# Patient Record
Sex: Female | Born: 1940
Health system: Southern US, Community
[De-identification: ages and names within clinical notes are randomized; demographics above are authoritative.]

## PROBLEM LIST (undated history)

## (undated) DIAGNOSIS — K219 Gastro-esophageal reflux disease without esophagitis: Secondary | ICD-10-CM

## (undated) DIAGNOSIS — F419 Anxiety disorder, unspecified: Secondary | ICD-10-CM

## (undated) DIAGNOSIS — T8859XA Other complications of anesthesia, initial encounter: Secondary | ICD-10-CM

## (undated) DIAGNOSIS — E042 Nontoxic multinodular goiter: Secondary | ICD-10-CM

## (undated) DIAGNOSIS — G43909 Migraine, unspecified, not intractable, without status migrainosus: Secondary | ICD-10-CM

## (undated) DIAGNOSIS — I10 Essential (primary) hypertension: Secondary | ICD-10-CM

## (undated) DIAGNOSIS — K8020 Calculus of gallbladder without cholecystitis without obstruction: Secondary | ICD-10-CM

## (undated) DIAGNOSIS — J45909 Unspecified asthma, uncomplicated: Secondary | ICD-10-CM

## (undated) DIAGNOSIS — T4145XA Adverse effect of unspecified anesthetic, initial encounter: Secondary | ICD-10-CM

## (undated) DIAGNOSIS — M1990 Unspecified osteoarthritis, unspecified site: Secondary | ICD-10-CM

## (undated) DIAGNOSIS — K589 Irritable bowel syndrome without diarrhea: Secondary | ICD-10-CM

## (undated) HISTORY — PX: KNEE ARTHROPLASTY: SHX992

## (undated) HISTORY — DX: Migraine, unspecified, not intractable, without status migrainosus: G43.909

## (undated) HISTORY — PX: HIP RESECTION ARTHROPLASTY: SHX1759

---
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
## 2008-07-07 ENCOUNTER — Emergency Department (HOSPITAL_COMMUNITY): Admission: EM | Admit: 2008-07-07 | Discharge: 2008-07-07 | Payer: Self-pay | Admitting: Family Medicine

## 2008-12-16 ENCOUNTER — Emergency Department (HOSPITAL_COMMUNITY): Admission: EM | Admit: 2008-12-16 | Discharge: 2008-12-16 | Payer: Self-pay | Admitting: Family Medicine

## 2009-01-13 ENCOUNTER — Ambulatory Visit (HOSPITAL_COMMUNITY): Admission: RE | Admit: 2009-01-13 | Discharge: 2009-01-13 | Payer: Self-pay | Admitting: Obstetrics & Gynecology

## 2009-05-08 ENCOUNTER — Inpatient Hospital Stay (HOSPITAL_COMMUNITY): Admission: RE | Admit: 2009-05-08 | Discharge: 2009-05-11 | Payer: Self-pay | Admitting: Orthopedic Surgery

## 2010-01-15 ENCOUNTER — Encounter: Admission: RE | Admit: 2010-01-15 | Discharge: 2010-01-15 | Payer: Self-pay | Admitting: Internal Medicine

## 2010-02-16 ENCOUNTER — Ambulatory Visit (HOSPITAL_COMMUNITY): Admission: RE | Admit: 2010-02-16 | Discharge: 2010-02-16 | Payer: Self-pay | Admitting: Orthopedic Surgery

## 2010-05-02 ENCOUNTER — Inpatient Hospital Stay (HOSPITAL_COMMUNITY): Admission: RE | Admit: 2010-05-02 | Discharge: 2010-05-05 | Payer: Self-pay | Admitting: Orthopedic Surgery

## 2010-08-26 ENCOUNTER — Encounter: Payer: Self-pay | Admitting: Internal Medicine

## 2010-10-18 LAB — CBC
HCT: 31.5 % — ABNORMAL LOW (ref 36.0–46.0)
Hemoglobin: 11.2 g/dL — ABNORMAL LOW (ref 12.0–15.0)
Hemoglobin: 11.4 g/dL — ABNORMAL LOW (ref 12.0–15.0)
MCHC: 34.6 g/dL (ref 30.0–36.0)
MCHC: 34 g/dL (ref 30.0–36.0)
MCHC: 34.3 g/dL (ref 30.0–36.0)
MCV: 92.7 fL (ref 78.0–100.0)
MCV: 92.7 fL (ref 78.0–100.0)
MCV: 92.9 fL (ref 78.0–100.0)
Platelets: 249 10*3/uL (ref 150–400)
Platelets: 242 10*3/uL (ref 150–400)
Platelets: 249 10*3/uL (ref 150–400)
RBC: 3.49 MIL/uL — ABNORMAL LOW (ref 3.87–5.11)
RDW: 12.9 % (ref 11.5–15.5)
RDW: 13.4 % (ref 11.5–15.5)

## 2010-10-18 LAB — SURGICAL PCR SCREEN
MRSA, PCR: NEGATIVE
Staphylococcus aureus: NEGATIVE

## 2010-10-18 LAB — PROTIME-INR
INR: 1 (ref 0.00–1.49)
INR: 1.46 (ref 0.00–1.49)
Prothrombin Time: 13.4 seconds (ref 11.6–15.2)
Prothrombin Time: 17.9 seconds — ABNORMAL HIGH (ref 11.6–15.2)

## 2010-10-18 LAB — TYPE AND SCREEN
ABO/RH(D): A POS
Antibody Screen: NEGATIVE

## 2010-10-18 LAB — APTT: aPTT: 32 seconds (ref 24–37)

## 2010-10-18 LAB — BASIC METABOLIC PANEL
BUN: 10 mg/dL (ref 6–23)
BUN: 6 mg/dL (ref 6–23)
CO2: 26 mEq/L (ref 19–32)
Calcium: 8.6 mg/dL (ref 8.4–10.5)
Chloride: 104 mEq/L (ref 96–112)
Creatinine, Ser: 0.9 mg/dL (ref 0.4–1.2)
GFR calc Af Amer: >60 mL/min (ref >60)
GFR calc non Af Amer: >60 mL/min (ref >60)
GFR calc non Af Amer: >60 mL/min (ref >60)
Potassium: 3.7 mEq/L (ref 3.5–5.1)
Sodium: 136 mEq/L (ref 135–145)

## 2010-10-18 LAB — COMPREHENSIVE METABOLIC PANEL
AST: 37 U/L (ref 0–37)
Alkaline Phosphatase: 60 U/L (ref 39–117)
Chloride: 108 mEq/L (ref 96–112)
GFR calc Af Amer: 57 mL/min — ABNORMAL LOW (ref >60)
GFR calc non Af Amer: 47 mL/min — ABNORMAL LOW (ref >60)
Sodium: 142 mEq/L (ref 135–145)

## 2010-10-18 LAB — URINE MICROSCOPIC-ADD ON

## 2010-10-18 LAB — URINALYSIS, ROUTINE W REFLEX MICROSCOPIC
Bilirubin Urine: NEGATIVE
Hgb urine dipstick: NEGATIVE
Protein, ur: NEGATIVE mg/dL
Urobilinogen, UA: 1 mg/dL (ref 0.0–1.0)
pH: 5.5 (ref 5.0–8.0)

## 2010-10-20 LAB — COMPREHENSIVE METABOLIC PANEL
Alkaline Phosphatase: 72 U/L (ref 39–117)
BUN: 17 mg/dL (ref 6–23)
Chloride: 104 mEq/L (ref 96–112)
Creatinine, Ser: 1.19 mg/dL (ref 0.4–1.2)
GFR calc non Af Amer: 45 mL/min — ABNORMAL LOW (ref >60)
Glucose, Bld: 124 mg/dL — ABNORMAL HIGH (ref 70–99)
Potassium: 4.1 mEq/L (ref 3.5–5.1)
Total Bilirubin: 0.5 mg/dL (ref 0.3–1.2)

## 2010-10-20 LAB — URINALYSIS, ROUTINE W REFLEX MICROSCOPIC
Bilirubin Urine: NEGATIVE
Protein, ur: NEGATIVE mg/dL
Specific Gravity, Urine: 1.026 (ref 1.005–1.030)

## 2010-10-20 LAB — CBC
HCT: 40.4 % (ref 36.0–46.0)
MCH: 31.9 pg (ref 26.0–34.0)
MCV: 91.8 fL (ref 78.0–100.0)
Platelets: 303 10*3/uL (ref 150–400)
RBC: 4.4 MIL/uL (ref 3.87–5.11)

## 2010-10-20 LAB — SURGICAL PCR SCREEN
MRSA, PCR: NEGATIVE
Staphylococcus aureus: NEGATIVE

## 2010-10-20 LAB — PROTIME-INR
INR: 1.03 (ref 0.00–1.49)
Prothrombin Time: 13.4 seconds (ref 11.6–15.2)

## 2010-11-08 LAB — BASIC METABOLIC PANEL
CO2: 27 mEq/L (ref 19–32)
CO2: 33 mEq/L — ABNORMAL HIGH (ref 19–32)
Chloride: 101 mEq/L (ref 96–112)
Chloride: 104 mEq/L (ref 96–112)
Creatinine, Ser: 0.87 mg/dL (ref 0.4–1.2)
GFR calc Af Amer: >60 mL/min (ref >60)
GFR calc non Af Amer: >60 mL/min (ref >60)
Glucose, Bld: 172 mg/dL — ABNORMAL HIGH (ref 70–99)
Potassium: 4.2 mEq/L (ref 3.5–5.1)
Sodium: 137 mEq/L (ref 135–145)

## 2010-11-08 LAB — CBC
HCT: 31.8 % — ABNORMAL LOW (ref 36.0–46.0)
HCT: 33.2 % — ABNORMAL LOW (ref 36.0–46.0)
Hemoglobin: 10.7 g/dL — ABNORMAL LOW (ref 12.0–15.0)
Hemoglobin: 11.4 g/dL — ABNORMAL LOW (ref 12.0–15.0)
MCHC: 33.6 g/dL (ref 30.0–36.0)
MCHC: 34 g/dL (ref 30.0–36.0)
MCHC: 34.2 g/dL (ref 30.0–36.0)
MCV: 93.3 fL (ref 78.0–100.0)
MCV: 94.3 fL (ref 78.0–100.0)
RBC: 3.18 MIL/uL — ABNORMAL LOW (ref 3.87–5.11)
RDW: 13.1 % (ref 11.5–15.5)
RDW: 13.2 % (ref 11.5–15.5)

## 2010-11-08 LAB — URINALYSIS, ROUTINE W REFLEX MICROSCOPIC
Nitrite: NEGATIVE
Protein, ur: NEGATIVE mg/dL
Urobilinogen, UA: 0.2 mg/dL (ref 0.0–1.0)

## 2010-11-08 LAB — URINE CULTURE
Colony Count: NO GROWTH
Culture: NO GROWTH

## 2010-11-08 LAB — URINE MICROSCOPIC-ADD ON

## 2010-11-08 LAB — PROTIME-INR: Prothrombin Time: 13.9 seconds (ref 11.6–15.2)

## 2010-11-09 LAB — APTT: aPTT: 25 seconds (ref 24–37)

## 2010-11-09 LAB — CBC
HCT: 39.8 % (ref 36.0–46.0)
MCV: 92.9 fL (ref 78.0–100.0)
Platelets: 280 10*3/uL (ref 150–400)
RDW: 13.1 % (ref 11.5–15.5)

## 2010-11-09 LAB — COMPREHENSIVE METABOLIC PANEL
Albumin: 4 g/dL (ref 3.5–5.2)
BUN: 19 mg/dL (ref 6–23)
Chloride: 103 mEq/L (ref 96–112)
Creatinine, Ser: 1.05 mg/dL (ref 0.4–1.2)
Glucose, Bld: 86 mg/dL (ref 70–99)
Total Bilirubin: 0.8 mg/dL (ref 0.3–1.2)
Total Protein: 7.2 g/dL (ref 6.0–8.3)

## 2010-11-09 LAB — PROTIME-INR
INR: 0.9 (ref 0.00–1.49)
Prothrombin Time: 12.3 seconds (ref 11.6–15.2)

## 2010-11-09 LAB — URINALYSIS, ROUTINE W REFLEX MICROSCOPIC
Bilirubin Urine: NEGATIVE
Nitrite: NEGATIVE
Protein, ur: NEGATIVE mg/dL
Specific Gravity, Urine: 1.019 (ref 1.005–1.030)
Urobilinogen, UA: 0.2 mg/dL (ref 0.0–1.0)

## 2011-05-21 ENCOUNTER — Ambulatory Visit (INDEPENDENT_AMBULATORY_CARE_PROVIDER_SITE_OTHER): Payer: Self-pay | Admitting: Family Medicine

## 2011-05-21 DIAGNOSIS — E669 Obesity, unspecified: Secondary | ICD-10-CM

## 2011-08-19 DIAGNOSIS — J209 Acute bronchitis, unspecified: Secondary | ICD-10-CM | POA: Diagnosis not present

## 2011-08-19 DIAGNOSIS — R05 Cough: Secondary | ICD-10-CM | POA: Diagnosis not present

## 2011-10-11 DIAGNOSIS — H43399 Other vitreous opacities, unspecified eye: Secondary | ICD-10-CM | POA: Diagnosis not present

## 2011-10-11 DIAGNOSIS — H40019 Open angle with borderline findings, low risk, unspecified eye: Secondary | ICD-10-CM | POA: Diagnosis not present

## 2011-10-11 DIAGNOSIS — H538 Other visual disturbances: Secondary | ICD-10-CM | POA: Diagnosis not present

## 2011-10-11 DIAGNOSIS — H2510 Age-related nuclear cataract, unspecified eye: Secondary | ICD-10-CM | POA: Diagnosis not present

## 2011-10-11 DIAGNOSIS — H43819 Vitreous degeneration, unspecified eye: Secondary | ICD-10-CM | POA: Diagnosis not present

## 2011-10-28 DIAGNOSIS — M79609 Pain in unspecified limb: Secondary | ICD-10-CM | POA: Diagnosis not present

## 2011-11-01 DIAGNOSIS — H40019 Open angle with borderline findings, low risk, unspecified eye: Secondary | ICD-10-CM | POA: Diagnosis not present

## 2011-11-01 DIAGNOSIS — H2510 Age-related nuclear cataract, unspecified eye: Secondary | ICD-10-CM | POA: Diagnosis not present

## 2011-11-01 DIAGNOSIS — H31019 Macula scars of posterior pole (postinflammatory) (post-traumatic), unspecified eye: Secondary | ICD-10-CM | POA: Diagnosis not present

## 2011-11-01 DIAGNOSIS — H25019 Cortical age-related cataract, unspecified eye: Secondary | ICD-10-CM | POA: Diagnosis not present

## 2011-11-01 DIAGNOSIS — H04129 Dry eye syndrome of unspecified lacrimal gland: Secondary | ICD-10-CM | POA: Diagnosis not present

## 2011-11-08 DIAGNOSIS — J449 Chronic obstructive pulmonary disease, unspecified: Secondary | ICD-10-CM | POA: Diagnosis not present

## 2011-11-08 DIAGNOSIS — I119 Hypertensive heart disease without heart failure: Secondary | ICD-10-CM | POA: Diagnosis not present

## 2011-11-08 DIAGNOSIS — I129 Hypertensive chronic kidney disease with stage 1 through stage 4 chronic kidney disease, or unspecified chronic kidney disease: Secondary | ICD-10-CM | POA: Diagnosis not present

## 2011-11-08 DIAGNOSIS — J45909 Unspecified asthma, uncomplicated: Secondary | ICD-10-CM | POA: Diagnosis not present

## 2011-11-12 DIAGNOSIS — N170 Acute kidney failure with tubular necrosis: Secondary | ICD-10-CM | POA: Diagnosis not present

## 2011-11-12 DIAGNOSIS — I129 Hypertensive chronic kidney disease with stage 1 through stage 4 chronic kidney disease, or unspecified chronic kidney disease: Secondary | ICD-10-CM | POA: Diagnosis not present

## 2011-11-12 DIAGNOSIS — I10 Essential (primary) hypertension: Secondary | ICD-10-CM | POA: Diagnosis not present

## 2011-11-12 DIAGNOSIS — Z79899 Other long term (current) drug therapy: Secondary | ICD-10-CM | POA: Diagnosis not present

## 2011-11-14 ENCOUNTER — Other Ambulatory Visit: Payer: Self-pay | Admitting: Family Medicine

## 2011-11-14 ENCOUNTER — Ambulatory Visit
Admission: RE | Admit: 2011-11-14 | Discharge: 2011-11-14 | Disposition: A | Discharge status: Home / Self Care | Payer: Medicare Other | Source: Ambulatory Visit | Attending: Family Medicine | Admitting: Family Medicine

## 2011-11-14 DIAGNOSIS — J449 Chronic obstructive pulmonary disease, unspecified: Secondary | ICD-10-CM

## 2011-11-14 DIAGNOSIS — Z01811 Encounter for preprocedural respiratory examination: Secondary | ICD-10-CM | POA: Diagnosis not present

## 2011-11-20 DIAGNOSIS — J309 Allergic rhinitis, unspecified: Secondary | ICD-10-CM | POA: Diagnosis not present

## 2011-11-21 DIAGNOSIS — M6240 Contracture of muscle, unspecified site: Secondary | ICD-10-CM | POA: Diagnosis not present

## 2011-11-21 DIAGNOSIS — M7750 Other enthesopathy of unspecified foot: Secondary | ICD-10-CM | POA: Diagnosis not present

## 2011-11-21 DIAGNOSIS — M2020 Hallux rigidus, unspecified foot: Secondary | ICD-10-CM | POA: Diagnosis not present

## 2011-11-21 DIAGNOSIS — M2040 Other hammer toe(s) (acquired), unspecified foot: Secondary | ICD-10-CM | POA: Diagnosis not present

## 2011-12-12 DIAGNOSIS — IMO0002 Reserved for concepts with insufficient information to code with codable children: Secondary | ICD-10-CM | POA: Diagnosis not present

## 2011-12-18 DIAGNOSIS — IMO0002 Reserved for concepts with insufficient information to code with codable children: Secondary | ICD-10-CM | POA: Diagnosis not present

## 2011-12-24 DIAGNOSIS — IMO0002 Reserved for concepts with insufficient information to code with codable children: Secondary | ICD-10-CM | POA: Diagnosis not present

## 2011-12-26 DIAGNOSIS — IMO0002 Reserved for concepts with insufficient information to code with codable children: Secondary | ICD-10-CM | POA: Diagnosis not present

## 2011-12-31 DIAGNOSIS — J309 Allergic rhinitis, unspecified: Secondary | ICD-10-CM | POA: Diagnosis not present

## 2012-01-01 DIAGNOSIS — IMO0002 Reserved for concepts with insufficient information to code with codable children: Secondary | ICD-10-CM | POA: Diagnosis not present

## 2012-01-02 ENCOUNTER — Ambulatory Visit (HOSPITAL_BASED_OUTPATIENT_CLINIC_OR_DEPARTMENT_OTHER): Admission: RE | Admit: 2012-01-02 | Payer: Medicare Other | Source: Ambulatory Visit | Admitting: Orthopedic Surgery

## 2012-01-02 ENCOUNTER — Encounter (HOSPITAL_BASED_OUTPATIENT_CLINIC_OR_DEPARTMENT_OTHER): Admission: RE | Payer: Self-pay | Source: Ambulatory Visit

## 2012-01-02 SURGERY — ARTHRODESIS FOOT WITH WEIL OSTEOTOMY
Anesthesia: General | Site: Foot | Laterality: Left

## 2012-01-03 DIAGNOSIS — IMO0002 Reserved for concepts with insufficient information to code with codable children: Secondary | ICD-10-CM | POA: Diagnosis not present

## 2012-01-07 DIAGNOSIS — IMO0002 Reserved for concepts with insufficient information to code with codable children: Secondary | ICD-10-CM | POA: Diagnosis not present

## 2012-01-10 DIAGNOSIS — IMO0002 Reserved for concepts with insufficient information to code with codable children: Secondary | ICD-10-CM | POA: Diagnosis not present

## 2012-01-15 DIAGNOSIS — IMO0002 Reserved for concepts with insufficient information to code with codable children: Secondary | ICD-10-CM | POA: Diagnosis not present

## 2012-01-21 DIAGNOSIS — IMO0002 Reserved for concepts with insufficient information to code with codable children: Secondary | ICD-10-CM | POA: Diagnosis not present

## 2012-02-11 DIAGNOSIS — IMO0002 Reserved for concepts with insufficient information to code with codable children: Secondary | ICD-10-CM | POA: Diagnosis not present

## 2012-04-16 DIAGNOSIS — H2510 Age-related nuclear cataract, unspecified eye: Secondary | ICD-10-CM | POA: Diagnosis not present

## 2012-04-16 DIAGNOSIS — H25019 Cortical age-related cataract, unspecified eye: Secondary | ICD-10-CM | POA: Diagnosis not present

## 2012-04-16 DIAGNOSIS — H40019 Open angle with borderline findings, low risk, unspecified eye: Secondary | ICD-10-CM | POA: Diagnosis not present

## 2012-04-16 DIAGNOSIS — H43399 Other vitreous opacities, unspecified eye: Secondary | ICD-10-CM | POA: Diagnosis not present

## 2012-04-16 DIAGNOSIS — H35379 Puckering of macula, unspecified eye: Secondary | ICD-10-CM | POA: Diagnosis not present

## 2012-05-05 DIAGNOSIS — Z23 Encounter for immunization: Secondary | ICD-10-CM | POA: Diagnosis not present

## 2012-06-15 DIAGNOSIS — J3089 Other allergic rhinitis: Secondary | ICD-10-CM | POA: Diagnosis not present

## 2012-06-15 DIAGNOSIS — L659 Nonscarring hair loss, unspecified: Secondary | ICD-10-CM | POA: Diagnosis not present

## 2012-06-15 DIAGNOSIS — E559 Vitamin D deficiency, unspecified: Secondary | ICD-10-CM | POA: Diagnosis not present

## 2012-06-15 DIAGNOSIS — Z0000 Encounter for general adult medical examination without abnormal findings: Secondary | ICD-10-CM | POA: Diagnosis not present

## 2012-06-15 DIAGNOSIS — M25519 Pain in unspecified shoulder: Secondary | ICD-10-CM | POA: Diagnosis not present

## 2012-06-15 DIAGNOSIS — M7530 Calcific tendinitis of unspecified shoulder: Secondary | ICD-10-CM | POA: Diagnosis not present

## 2012-06-15 DIAGNOSIS — I119 Hypertensive heart disease without heart failure: Secondary | ICD-10-CM | POA: Diagnosis not present

## 2012-10-02 DIAGNOSIS — J069 Acute upper respiratory infection, unspecified: Secondary | ICD-10-CM | POA: Diagnosis not present

## 2012-10-02 DIAGNOSIS — I10 Essential (primary) hypertension: Secondary | ICD-10-CM | POA: Diagnosis not present

## 2012-11-23 DIAGNOSIS — H43399 Other vitreous opacities, unspecified eye: Secondary | ICD-10-CM | POA: Diagnosis not present

## 2012-11-23 DIAGNOSIS — H04129 Dry eye syndrome of unspecified lacrimal gland: Secondary | ICD-10-CM | POA: Diagnosis not present

## 2012-11-23 DIAGNOSIS — H43819 Vitreous degeneration, unspecified eye: Secondary | ICD-10-CM | POA: Diagnosis not present

## 2012-11-23 DIAGNOSIS — H40019 Open angle with borderline findings, low risk, unspecified eye: Secondary | ICD-10-CM | POA: Diagnosis not present

## 2013-01-08 DIAGNOSIS — E559 Vitamin D deficiency, unspecified: Secondary | ICD-10-CM | POA: Diagnosis not present

## 2013-01-08 DIAGNOSIS — J449 Chronic obstructive pulmonary disease, unspecified: Secondary | ICD-10-CM | POA: Diagnosis not present

## 2013-01-08 DIAGNOSIS — I129 Hypertensive chronic kidney disease with stage 1 through stage 4 chronic kidney disease, or unspecified chronic kidney disease: Secondary | ICD-10-CM | POA: Diagnosis not present

## 2013-02-08 DIAGNOSIS — Z79899 Other long term (current) drug therapy: Secondary | ICD-10-CM | POA: Diagnosis not present

## 2013-02-08 DIAGNOSIS — I10 Essential (primary) hypertension: Secondary | ICD-10-CM | POA: Diagnosis not present

## 2013-02-08 DIAGNOSIS — R209 Unspecified disturbances of skin sensation: Secondary | ICD-10-CM | POA: Diagnosis not present

## 2013-02-08 DIAGNOSIS — M159 Polyosteoarthritis, unspecified: Secondary | ICD-10-CM | POA: Diagnosis not present

## 2013-02-16 DIAGNOSIS — M169 Osteoarthritis of hip, unspecified: Secondary | ICD-10-CM | POA: Diagnosis not present

## 2013-02-16 DIAGNOSIS — Z96659 Presence of unspecified artificial knee joint: Secondary | ICD-10-CM | POA: Diagnosis not present

## 2013-02-16 DIAGNOSIS — Z471 Aftercare following joint replacement surgery: Secondary | ICD-10-CM | POA: Diagnosis not present

## 2013-02-16 DIAGNOSIS — M545 Low back pain: Secondary | ICD-10-CM | POA: Diagnosis not present

## 2013-02-16 DIAGNOSIS — Z96649 Presence of unspecified artificial hip joint: Secondary | ICD-10-CM | POA: Diagnosis not present

## 2013-02-16 DIAGNOSIS — M1710 Unilateral primary osteoarthritis, unspecified knee: Secondary | ICD-10-CM | POA: Diagnosis not present

## 2013-03-09 DIAGNOSIS — Z87891 Personal history of nicotine dependence: Secondary | ICD-10-CM | POA: Diagnosis not present

## 2013-03-09 DIAGNOSIS — J309 Allergic rhinitis, unspecified: Secondary | ICD-10-CM | POA: Diagnosis not present

## 2013-03-09 DIAGNOSIS — J449 Chronic obstructive pulmonary disease, unspecified: Secondary | ICD-10-CM | POA: Diagnosis not present

## 2013-03-10 DIAGNOSIS — M2010 Hallux valgus (acquired), unspecified foot: Secondary | ICD-10-CM | POA: Diagnosis not present

## 2013-03-10 DIAGNOSIS — G5760 Lesion of plantar nerve, unspecified lower limb: Secondary | ICD-10-CM | POA: Diagnosis not present

## 2013-03-10 DIAGNOSIS — M2040 Other hammer toe(s) (acquired), unspecified foot: Secondary | ICD-10-CM | POA: Diagnosis not present

## 2013-03-22 DIAGNOSIS — M2020 Hallux rigidus, unspecified foot: Secondary | ICD-10-CM | POA: Diagnosis not present

## 2013-03-22 DIAGNOSIS — M2040 Other hammer toe(s) (acquired), unspecified foot: Secondary | ICD-10-CM | POA: Diagnosis not present

## 2013-03-22 DIAGNOSIS — M79609 Pain in unspecified limb: Secondary | ICD-10-CM | POA: Diagnosis not present

## 2013-04-29 DIAGNOSIS — H04129 Dry eye syndrome of unspecified lacrimal gland: Secondary | ICD-10-CM | POA: Diagnosis not present

## 2013-04-29 DIAGNOSIS — H43399 Other vitreous opacities, unspecified eye: Secondary | ICD-10-CM | POA: Diagnosis not present

## 2013-04-29 DIAGNOSIS — H538 Other visual disturbances: Secondary | ICD-10-CM | POA: Diagnosis not present

## 2013-04-29 DIAGNOSIS — H2510 Age-related nuclear cataract, unspecified eye: Secondary | ICD-10-CM | POA: Diagnosis not present

## 2013-04-29 DIAGNOSIS — H35379 Puckering of macula, unspecified eye: Secondary | ICD-10-CM | POA: Diagnosis not present

## 2013-04-29 DIAGNOSIS — H31019 Macula scars of posterior pole (postinflammatory) (post-traumatic), unspecified eye: Secondary | ICD-10-CM | POA: Diagnosis not present

## 2013-04-29 DIAGNOSIS — H40019 Open angle with borderline findings, low risk, unspecified eye: Secondary | ICD-10-CM | POA: Diagnosis not present

## 2013-05-06 DIAGNOSIS — Z23 Encounter for immunization: Secondary | ICD-10-CM | POA: Diagnosis not present

## 2013-06-23 DIAGNOSIS — E785 Hyperlipidemia, unspecified: Secondary | ICD-10-CM | POA: Diagnosis not present

## 2013-06-23 DIAGNOSIS — I10 Essential (primary) hypertension: Secondary | ICD-10-CM | POA: Diagnosis not present

## 2013-06-23 DIAGNOSIS — Z79899 Other long term (current) drug therapy: Secondary | ICD-10-CM | POA: Diagnosis not present

## 2013-06-23 DIAGNOSIS — Z0000 Encounter for general adult medical examination without abnormal findings: Secondary | ICD-10-CM | POA: Diagnosis not present

## 2013-06-23 DIAGNOSIS — E559 Vitamin D deficiency, unspecified: Secondary | ICD-10-CM | POA: Diagnosis not present

## 2013-06-29 DIAGNOSIS — I129 Hypertensive chronic kidney disease with stage 1 through stage 4 chronic kidney disease, or unspecified chronic kidney disease: Secondary | ICD-10-CM | POA: Diagnosis not present

## 2013-06-29 DIAGNOSIS — E785 Hyperlipidemia, unspecified: Secondary | ICD-10-CM | POA: Diagnosis not present

## 2013-06-29 DIAGNOSIS — J449 Chronic obstructive pulmonary disease, unspecified: Secondary | ICD-10-CM | POA: Diagnosis not present

## 2013-06-29 DIAGNOSIS — M1710 Unilateral primary osteoarthritis, unspecified knee: Secondary | ICD-10-CM | POA: Diagnosis not present

## 2013-06-29 DIAGNOSIS — Z0000 Encounter for general adult medical examination without abnormal findings: Secondary | ICD-10-CM | POA: Diagnosis not present

## 2013-07-19 ENCOUNTER — Other Ambulatory Visit: Payer: Self-pay | Admitting: Internal Medicine

## 2013-07-19 DIAGNOSIS — Z1231 Encounter for screening mammogram for malignant neoplasm of breast: Secondary | ICD-10-CM

## 2013-10-12 DIAGNOSIS — H40019 Open angle with borderline findings, low risk, unspecified eye: Secondary | ICD-10-CM | POA: Diagnosis not present

## 2013-10-12 DIAGNOSIS — H04129 Dry eye syndrome of unspecified lacrimal gland: Secondary | ICD-10-CM | POA: Diagnosis not present

## 2013-12-28 DIAGNOSIS — I129 Hypertensive chronic kidney disease with stage 1 through stage 4 chronic kidney disease, or unspecified chronic kidney disease: Secondary | ICD-10-CM | POA: Diagnosis not present

## 2013-12-28 DIAGNOSIS — J309 Allergic rhinitis, unspecified: Secondary | ICD-10-CM | POA: Diagnosis not present

## 2013-12-28 DIAGNOSIS — N183 Chronic kidney disease, stage 3 unspecified: Secondary | ICD-10-CM | POA: Diagnosis not present

## 2013-12-28 DIAGNOSIS — M159 Polyosteoarthritis, unspecified: Secondary | ICD-10-CM | POA: Diagnosis not present

## 2014-05-02 DIAGNOSIS — H2510 Age-related nuclear cataract, unspecified eye: Secondary | ICD-10-CM | POA: Diagnosis not present

## 2014-05-02 DIAGNOSIS — H25019 Cortical age-related cataract, unspecified eye: Secondary | ICD-10-CM | POA: Diagnosis not present

## 2014-05-02 DIAGNOSIS — H35379 Puckering of macula, unspecified eye: Secondary | ICD-10-CM | POA: Diagnosis not present

## 2014-05-02 DIAGNOSIS — H5210 Myopia, unspecified eye: Secondary | ICD-10-CM | POA: Diagnosis not present

## 2014-05-02 DIAGNOSIS — H40019 Open angle with borderline findings, low risk, unspecified eye: Secondary | ICD-10-CM | POA: Diagnosis not present

## 2014-05-13 DIAGNOSIS — Z23 Encounter for immunization: Secondary | ICD-10-CM | POA: Diagnosis not present

## 2014-05-23 DIAGNOSIS — J45909 Unspecified asthma, uncomplicated: Secondary | ICD-10-CM | POA: Diagnosis not present

## 2014-05-23 DIAGNOSIS — I10 Essential (primary) hypertension: Secondary | ICD-10-CM | POA: Diagnosis not present

## 2014-05-23 DIAGNOSIS — J309 Allergic rhinitis, unspecified: Secondary | ICD-10-CM | POA: Diagnosis not present

## 2014-05-23 DIAGNOSIS — M1990 Unspecified osteoarthritis, unspecified site: Secondary | ICD-10-CM | POA: Diagnosis not present

## 2014-06-28 DIAGNOSIS — S46111A Strain of muscle, fascia and tendon of long head of biceps, right arm, initial encounter: Secondary | ICD-10-CM | POA: Diagnosis not present

## 2014-06-28 DIAGNOSIS — W109XXA Fall (on) (from) unspecified stairs and steps, initial encounter: Secondary | ICD-10-CM | POA: Diagnosis not present

## 2014-07-15 DIAGNOSIS — S46111D Strain of muscle, fascia and tendon of long head of biceps, right arm, subsequent encounter: Secondary | ICD-10-CM | POA: Diagnosis not present

## 2014-07-19 DIAGNOSIS — S46111D Strain of muscle, fascia and tendon of long head of biceps, right arm, subsequent encounter: Secondary | ICD-10-CM | POA: Diagnosis not present

## 2014-07-25 DIAGNOSIS — I10 Essential (primary) hypertension: Secondary | ICD-10-CM | POA: Diagnosis not present

## 2014-07-25 DIAGNOSIS — E782 Mixed hyperlipidemia: Secondary | ICD-10-CM | POA: Diagnosis not present

## 2014-07-25 DIAGNOSIS — R7309 Other abnormal glucose: Secondary | ICD-10-CM | POA: Diagnosis not present

## 2014-07-25 DIAGNOSIS — Z0000 Encounter for general adult medical examination without abnormal findings: Secondary | ICD-10-CM | POA: Diagnosis not present

## 2014-07-26 DIAGNOSIS — S46111D Strain of muscle, fascia and tendon of long head of biceps, right arm, subsequent encounter: Secondary | ICD-10-CM | POA: Diagnosis not present

## 2014-08-02 DIAGNOSIS — S46111D Strain of muscle, fascia and tendon of long head of biceps, right arm, subsequent encounter: Secondary | ICD-10-CM | POA: Diagnosis not present

## 2014-08-03 DIAGNOSIS — I10 Essential (primary) hypertension: Secondary | ICD-10-CM | POA: Diagnosis not present

## 2014-08-03 DIAGNOSIS — Z0000 Encounter for general adult medical examination without abnormal findings: Secondary | ICD-10-CM | POA: Diagnosis not present

## 2014-08-10 ENCOUNTER — Other Ambulatory Visit: Payer: Self-pay

## 2014-08-10 DIAGNOSIS — Z1231 Encounter for screening mammogram for malignant neoplasm of breast: Secondary | ICD-10-CM

## 2014-08-30 ENCOUNTER — Ambulatory Visit: Payer: Medicare Other

## 2014-09-07 DIAGNOSIS — M25511 Pain in right shoulder: Secondary | ICD-10-CM | POA: Diagnosis not present

## 2014-09-07 DIAGNOSIS — M542 Cervicalgia: Secondary | ICD-10-CM | POA: Diagnosis not present

## 2014-09-13 ENCOUNTER — Ambulatory Visit: Payer: Medicare Other

## 2014-09-15 DIAGNOSIS — M19011 Primary osteoarthritis, right shoulder: Secondary | ICD-10-CM | POA: Diagnosis not present

## 2014-09-20 DIAGNOSIS — M19011 Primary osteoarthritis, right shoulder: Secondary | ICD-10-CM | POA: Diagnosis not present

## 2014-10-04 DIAGNOSIS — M7551 Bursitis of right shoulder: Secondary | ICD-10-CM | POA: Diagnosis not present

## 2014-10-04 DIAGNOSIS — M545 Low back pain: Secondary | ICD-10-CM | POA: Diagnosis not present

## 2014-10-06 DIAGNOSIS — H40013 Open angle with borderline findings, low risk, bilateral: Secondary | ICD-10-CM | POA: Diagnosis not present

## 2014-10-10 DIAGNOSIS — M25511 Pain in right shoulder: Secondary | ICD-10-CM | POA: Diagnosis not present

## 2014-10-10 DIAGNOSIS — M6281 Muscle weakness (generalized): Secondary | ICD-10-CM | POA: Diagnosis not present

## 2014-10-10 DIAGNOSIS — M7541 Impingement syndrome of right shoulder: Secondary | ICD-10-CM | POA: Diagnosis not present

## 2014-11-03 DIAGNOSIS — J069 Acute upper respiratory infection, unspecified: Secondary | ICD-10-CM | POA: Diagnosis not present

## 2014-11-03 DIAGNOSIS — M549 Dorsalgia, unspecified: Secondary | ICD-10-CM | POA: Diagnosis not present

## 2014-11-03 DIAGNOSIS — R05 Cough: Secondary | ICD-10-CM | POA: Diagnosis not present

## 2014-11-03 DIAGNOSIS — I10 Essential (primary) hypertension: Secondary | ICD-10-CM | POA: Diagnosis not present

## 2014-11-08 DIAGNOSIS — M25511 Pain in right shoulder: Secondary | ICD-10-CM | POA: Diagnosis not present

## 2014-11-14 DIAGNOSIS — M6281 Muscle weakness (generalized): Secondary | ICD-10-CM | POA: Diagnosis not present

## 2014-11-14 DIAGNOSIS — M25659 Stiffness of unspecified hip, not elsewhere classified: Secondary | ICD-10-CM | POA: Diagnosis not present

## 2014-11-14 DIAGNOSIS — M545 Low back pain: Secondary | ICD-10-CM | POA: Diagnosis not present

## 2014-11-17 DIAGNOSIS — M25659 Stiffness of unspecified hip, not elsewhere classified: Secondary | ICD-10-CM | POA: Diagnosis not present

## 2014-11-17 DIAGNOSIS — M545 Low back pain: Secondary | ICD-10-CM | POA: Diagnosis not present

## 2014-11-17 DIAGNOSIS — M6281 Muscle weakness (generalized): Secondary | ICD-10-CM | POA: Diagnosis not present

## 2014-11-21 DIAGNOSIS — M6281 Muscle weakness (generalized): Secondary | ICD-10-CM | POA: Diagnosis not present

## 2014-11-21 DIAGNOSIS — M25659 Stiffness of unspecified hip, not elsewhere classified: Secondary | ICD-10-CM | POA: Diagnosis not present

## 2014-11-21 DIAGNOSIS — M545 Low back pain: Secondary | ICD-10-CM | POA: Diagnosis not present

## 2014-11-24 DIAGNOSIS — M25659 Stiffness of unspecified hip, not elsewhere classified: Secondary | ICD-10-CM | POA: Diagnosis not present

## 2014-11-24 DIAGNOSIS — M6281 Muscle weakness (generalized): Secondary | ICD-10-CM | POA: Diagnosis not present

## 2014-11-24 DIAGNOSIS — M545 Low back pain: Secondary | ICD-10-CM | POA: Diagnosis not present

## 2014-12-01 DIAGNOSIS — M25659 Stiffness of unspecified hip, not elsewhere classified: Secondary | ICD-10-CM | POA: Diagnosis not present

## 2014-12-01 DIAGNOSIS — M6281 Muscle weakness (generalized): Secondary | ICD-10-CM | POA: Diagnosis not present

## 2014-12-01 DIAGNOSIS — M545 Low back pain: Secondary | ICD-10-CM | POA: Diagnosis not present

## 2014-12-05 DIAGNOSIS — M545 Low back pain: Secondary | ICD-10-CM | POA: Diagnosis not present

## 2014-12-05 DIAGNOSIS — M25659 Stiffness of unspecified hip, not elsewhere classified: Secondary | ICD-10-CM | POA: Diagnosis not present

## 2014-12-05 DIAGNOSIS — M6281 Muscle weakness (generalized): Secondary | ICD-10-CM | POA: Diagnosis not present

## 2014-12-08 DIAGNOSIS — M545 Low back pain: Secondary | ICD-10-CM | POA: Diagnosis not present

## 2014-12-08 DIAGNOSIS — M25659 Stiffness of unspecified hip, not elsewhere classified: Secondary | ICD-10-CM | POA: Diagnosis not present

## 2014-12-08 DIAGNOSIS — M6281 Muscle weakness (generalized): Secondary | ICD-10-CM | POA: Diagnosis not present

## 2014-12-12 DIAGNOSIS — M545 Low back pain: Secondary | ICD-10-CM | POA: Diagnosis not present

## 2014-12-12 DIAGNOSIS — M25659 Stiffness of unspecified hip, not elsewhere classified: Secondary | ICD-10-CM | POA: Diagnosis not present

## 2014-12-12 DIAGNOSIS — M6281 Muscle weakness (generalized): Secondary | ICD-10-CM | POA: Diagnosis not present

## 2014-12-15 DIAGNOSIS — M25511 Pain in right shoulder: Secondary | ICD-10-CM | POA: Diagnosis not present

## 2014-12-15 DIAGNOSIS — M5136 Other intervertebral disc degeneration, lumbar region: Secondary | ICD-10-CM | POA: Diagnosis not present

## 2014-12-29 DIAGNOSIS — M25511 Pain in right shoulder: Secondary | ICD-10-CM | POA: Diagnosis not present

## 2015-02-24 DIAGNOSIS — J449 Chronic obstructive pulmonary disease, unspecified: Secondary | ICD-10-CM | POA: Diagnosis not present

## 2015-02-24 DIAGNOSIS — I10 Essential (primary) hypertension: Secondary | ICD-10-CM | POA: Diagnosis not present

## 2015-02-24 DIAGNOSIS — J45901 Unspecified asthma with (acute) exacerbation: Secondary | ICD-10-CM | POA: Diagnosis not present

## 2015-02-24 DIAGNOSIS — Z7982 Long term (current) use of aspirin: Secondary | ICD-10-CM | POA: Diagnosis not present

## 2015-03-01 DIAGNOSIS — Z79899 Other long term (current) drug therapy: Secondary | ICD-10-CM | POA: Diagnosis not present

## 2015-03-01 DIAGNOSIS — Z87891 Personal history of nicotine dependence: Secondary | ICD-10-CM | POA: Diagnosis not present

## 2015-03-01 DIAGNOSIS — Z7982 Long term (current) use of aspirin: Secondary | ICD-10-CM | POA: Diagnosis not present

## 2015-03-01 DIAGNOSIS — J45909 Unspecified asthma, uncomplicated: Secondary | ICD-10-CM | POA: Diagnosis not present

## 2015-04-18 DIAGNOSIS — R05 Cough: Secondary | ICD-10-CM | POA: Diagnosis not present

## 2015-04-18 DIAGNOSIS — J45909 Unspecified asthma, uncomplicated: Secondary | ICD-10-CM | POA: Diagnosis not present

## 2015-04-18 DIAGNOSIS — J309 Allergic rhinitis, unspecified: Secondary | ICD-10-CM | POA: Diagnosis not present

## 2015-04-18 DIAGNOSIS — R51 Headache: Secondary | ICD-10-CM | POA: Diagnosis not present

## 2015-07-10 DIAGNOSIS — Z23 Encounter for immunization: Secondary | ICD-10-CM | POA: Diagnosis not present

## 2015-08-18 DIAGNOSIS — J309 Allergic rhinitis, unspecified: Secondary | ICD-10-CM | POA: Diagnosis not present

## 2015-08-18 DIAGNOSIS — M75111 Incomplete rotator cuff tear or rupture of right shoulder, not specified as traumatic: Secondary | ICD-10-CM | POA: Diagnosis not present

## 2015-08-18 DIAGNOSIS — J301 Allergic rhinitis due to pollen: Secondary | ICD-10-CM | POA: Insufficient documentation

## 2015-08-18 DIAGNOSIS — J410 Simple chronic bronchitis: Secondary | ICD-10-CM | POA: Diagnosis not present

## 2015-08-18 DIAGNOSIS — K219 Gastro-esophageal reflux disease without esophagitis: Secondary | ICD-10-CM | POA: Diagnosis not present

## 2015-08-18 DIAGNOSIS — K589 Irritable bowel syndrome without diarrhea: Secondary | ICD-10-CM | POA: Diagnosis not present

## 2015-08-18 DIAGNOSIS — I10 Essential (primary) hypertension: Secondary | ICD-10-CM | POA: Diagnosis not present

## 2015-08-18 DIAGNOSIS — M150 Primary generalized (osteo)arthritis: Secondary | ICD-10-CM | POA: Diagnosis not present

## 2015-11-15 DIAGNOSIS — H40013 Open angle with borderline findings, low risk, bilateral: Secondary | ICD-10-CM | POA: Diagnosis not present

## 2015-11-15 DIAGNOSIS — H35031 Hypertensive retinopathy, right eye: Secondary | ICD-10-CM | POA: Diagnosis not present

## 2015-11-15 DIAGNOSIS — H35032 Hypertensive retinopathy, left eye: Secondary | ICD-10-CM | POA: Diagnosis not present

## 2015-11-15 DIAGNOSIS — H35371 Puckering of macula, right eye: Secondary | ICD-10-CM | POA: Diagnosis not present

## 2015-11-16 DIAGNOSIS — M150 Primary generalized (osteo)arthritis: Secondary | ICD-10-CM | POA: Diagnosis not present

## 2015-11-16 DIAGNOSIS — I10 Essential (primary) hypertension: Secondary | ICD-10-CM | POA: Diagnosis not present

## 2015-11-16 DIAGNOSIS — K219 Gastro-esophageal reflux disease without esophagitis: Secondary | ICD-10-CM | POA: Diagnosis not present

## 2015-11-16 DIAGNOSIS — R0609 Other forms of dyspnea: Secondary | ICD-10-CM | POA: Diagnosis not present

## 2015-11-16 DIAGNOSIS — J301 Allergic rhinitis due to pollen: Secondary | ICD-10-CM | POA: Diagnosis not present

## 2015-11-16 DIAGNOSIS — J410 Simple chronic bronchitis: Secondary | ICD-10-CM | POA: Diagnosis not present

## 2015-11-16 DIAGNOSIS — E785 Hyperlipidemia, unspecified: Secondary | ICD-10-CM | POA: Diagnosis not present

## 2015-11-20 DIAGNOSIS — Z79899 Other long term (current) drug therapy: Secondary | ICD-10-CM | POA: Insufficient documentation

## 2015-11-24 DIAGNOSIS — Z1231 Encounter for screening mammogram for malignant neoplasm of breast: Secondary | ICD-10-CM | POA: Diagnosis not present

## 2015-11-29 DIAGNOSIS — Z780 Asymptomatic menopausal state: Secondary | ICD-10-CM | POA: Diagnosis not present

## 2016-02-16 DIAGNOSIS — I10 Essential (primary) hypertension: Secondary | ICD-10-CM | POA: Diagnosis not present

## 2016-02-16 DIAGNOSIS — R0600 Dyspnea, unspecified: Secondary | ICD-10-CM | POA: Diagnosis not present

## 2016-02-16 DIAGNOSIS — J301 Allergic rhinitis due to pollen: Secondary | ICD-10-CM | POA: Diagnosis not present

## 2016-02-16 DIAGNOSIS — D229 Melanocytic nevi, unspecified: Secondary | ICD-10-CM | POA: Diagnosis not present

## 2016-03-13 DIAGNOSIS — D239 Other benign neoplasm of skin, unspecified: Secondary | ICD-10-CM | POA: Diagnosis not present

## 2016-03-13 DIAGNOSIS — L821 Other seborrheic keratosis: Secondary | ICD-10-CM | POA: Diagnosis not present

## 2016-03-13 DIAGNOSIS — L570 Actinic keratosis: Secondary | ICD-10-CM | POA: Diagnosis not present

## 2016-05-07 DIAGNOSIS — J301 Allergic rhinitis due to pollen: Secondary | ICD-10-CM | POA: Diagnosis not present

## 2016-05-07 DIAGNOSIS — R05 Cough: Secondary | ICD-10-CM | POA: Diagnosis not present

## 2016-05-07 DIAGNOSIS — R0602 Shortness of breath: Secondary | ICD-10-CM | POA: Diagnosis not present

## 2016-05-07 DIAGNOSIS — I10 Essential (primary) hypertension: Secondary | ICD-10-CM | POA: Diagnosis not present

## 2016-05-16 DIAGNOSIS — M25511 Pain in right shoulder: Secondary | ICD-10-CM | POA: Diagnosis not present

## 2016-05-22 DIAGNOSIS — H40013 Open angle with borderline findings, low risk, bilateral: Secondary | ICD-10-CM | POA: Diagnosis not present

## 2016-05-22 DIAGNOSIS — H04123 Dry eye syndrome of bilateral lacrimal glands: Secondary | ICD-10-CM | POA: Diagnosis not present

## 2016-05-28 DIAGNOSIS — Z0000 Encounter for general adult medical examination without abnormal findings: Secondary | ICD-10-CM | POA: Diagnosis not present

## 2016-06-20 DIAGNOSIS — R0609 Other forms of dyspnea: Secondary | ICD-10-CM | POA: Diagnosis not present

## 2016-06-20 DIAGNOSIS — J301 Allergic rhinitis due to pollen: Secondary | ICD-10-CM | POA: Diagnosis not present

## 2016-06-20 DIAGNOSIS — J45909 Unspecified asthma, uncomplicated: Secondary | ICD-10-CM | POA: Diagnosis not present

## 2016-06-20 DIAGNOSIS — I10 Essential (primary) hypertension: Secondary | ICD-10-CM | POA: Diagnosis not present

## 2016-07-04 DIAGNOSIS — Z79899 Other long term (current) drug therapy: Secondary | ICD-10-CM | POA: Diagnosis not present

## 2016-07-04 DIAGNOSIS — I10 Essential (primary) hypertension: Secondary | ICD-10-CM | POA: Diagnosis not present

## 2016-08-20 DIAGNOSIS — R0602 Shortness of breath: Secondary | ICD-10-CM | POA: Diagnosis not present

## 2016-08-20 DIAGNOSIS — I10 Essential (primary) hypertension: Secondary | ICD-10-CM | POA: Diagnosis not present

## 2016-08-30 DIAGNOSIS — I10 Essential (primary) hypertension: Secondary | ICD-10-CM | POA: Diagnosis not present

## 2016-08-30 DIAGNOSIS — Z789 Other specified health status: Secondary | ICD-10-CM | POA: Insufficient documentation

## 2016-09-14 ENCOUNTER — Inpatient Hospital Stay (HOSPITAL_COMMUNITY)
Admission: EM | Admit: 2016-09-14 | Discharge: 2016-09-20 | DRG: 339 | Disposition: A | Discharge status: Home / Self Care | Payer: Medicare Other | Attending: Surgery | Admitting: Surgery

## 2016-09-14 ENCOUNTER — Encounter (HOSPITAL_COMMUNITY): Admission: EM | Disposition: A | Discharge status: Home / Self Care | Payer: Self-pay | Source: Home / Self Care

## 2016-09-14 ENCOUNTER — Emergency Department (HOSPITAL_COMMUNITY): Payer: Medicare Other | Admitting: Anesthesiology

## 2016-09-14 ENCOUNTER — Emergency Department (HOSPITAL_COMMUNITY): Payer: Medicare Other

## 2016-09-14 ENCOUNTER — Encounter (HOSPITAL_COMMUNITY): Payer: Self-pay | Admitting: Emergency Medicine

## 2016-09-14 DIAGNOSIS — I10 Essential (primary) hypertension: Secondary | ICD-10-CM | POA: Diagnosis not present

## 2016-09-14 DIAGNOSIS — K760 Fatty (change of) liver, not elsewhere classified: Secondary | ICD-10-CM | POA: Diagnosis present

## 2016-09-14 DIAGNOSIS — Z7982 Long term (current) use of aspirin: Secondary | ICD-10-CM

## 2016-09-14 DIAGNOSIS — K3580 Unspecified acute appendicitis: Secondary | ICD-10-CM

## 2016-09-14 DIAGNOSIS — I700 Atherosclerosis of aorta: Secondary | ICD-10-CM | POA: Diagnosis present

## 2016-09-14 DIAGNOSIS — Z96641 Presence of right artificial hip joint: Secondary | ICD-10-CM | POA: Diagnosis present

## 2016-09-14 DIAGNOSIS — Z87891 Personal history of nicotine dependence: Secondary | ICD-10-CM | POA: Diagnosis not present

## 2016-09-14 DIAGNOSIS — R1084 Generalized abdominal pain: Secondary | ICD-10-CM | POA: Diagnosis not present

## 2016-09-14 DIAGNOSIS — K3532 Acute appendicitis with perforation and localized peritonitis, without abscess: Secondary | ICD-10-CM | POA: Diagnosis present

## 2016-09-14 DIAGNOSIS — M1990 Unspecified osteoarthritis, unspecified site: Secondary | ICD-10-CM | POA: Diagnosis present

## 2016-09-14 DIAGNOSIS — K567 Ileus, unspecified: Secondary | ICD-10-CM | POA: Diagnosis not present

## 2016-09-14 DIAGNOSIS — E440 Moderate protein-calorie malnutrition: Secondary | ICD-10-CM | POA: Insufficient documentation

## 2016-09-14 DIAGNOSIS — K352 Acute appendicitis with generalized peritonitis: Principal | ICD-10-CM | POA: Diagnosis present

## 2016-09-14 DIAGNOSIS — Z79899 Other long term (current) drug therapy: Secondary | ICD-10-CM

## 2016-09-14 HISTORY — PX: LAPAROSCOPIC APPENDECTOMY: SHX408

## 2016-09-14 HISTORY — DX: Irritable bowel syndrome, unspecified: K58.9

## 2016-09-14 HISTORY — DX: Essential (primary) hypertension: I10

## 2016-09-14 LAB — COMPREHENSIVE METABOLIC PANEL
ALBUMIN: 3.9 g/dL (ref 3.5–5.0)
ALT: 15 U/L (ref 14–54)
ANION GAP: 10 (ref 5–15)
AST: 27 U/L (ref 15–41)
Alkaline Phosphatase: 69 U/L (ref 38–126)
BILIRUBIN TOTAL: 1.3 mg/dL — AB (ref 0.3–1.2)
BUN: 22 mg/dL — ABNORMAL HIGH (ref 6–20)
CHLORIDE: 98 mmol/L — AB (ref 101–111)
CO2: 27 mmol/L (ref 22–32)
Calcium: 9.7 mg/dL (ref 8.9–10.3)
Creatinine, Ser: 1.18 mg/dL — ABNORMAL HIGH (ref 0.44–1.00)
GFR calc Af Amer: 51 mL/min — ABNORMAL LOW (ref >60)
GFR, EST NON AFRICAN AMERICAN: 44 mL/min — AB (ref >60)
GLUCOSE: 108 mg/dL — AB (ref 65–99)
POTASSIUM: 4.2 mmol/L (ref 3.5–5.1)
Sodium: 135 mmol/L (ref 135–145)
TOTAL PROTEIN: 7.4 g/dL (ref 6.5–8.1)

## 2016-09-14 LAB — URINALYSIS, ROUTINE W REFLEX MICROSCOPIC
BACTERIA UA: NONE SEEN
BILIRUBIN URINE: NEGATIVE
GLUCOSE, UA: NEGATIVE mg/dL
KETONES UR: 5 mg/dL — AB
LEUKOCYTES UA: NEGATIVE
NITRITE: NEGATIVE
PROTEIN: 100 mg/dL — AB
Specific Gravity, Urine: 1.026 (ref 1.005–1.030)
pH: 5 (ref 5.0–8.0)

## 2016-09-14 LAB — CBC
HEMATOCRIT: 41.2 % (ref 36.0–46.0)
HEMOGLOBIN: 14.2 g/dL (ref 12.0–15.0)
MCH: 31 pg (ref 26.0–34.0)
MCHC: 34.5 g/dL (ref 30.0–36.0)
MCV: 90 fL (ref 78.0–100.0)
Platelets: 201 10*3/uL (ref 150–400)
RBC: 4.58 MIL/uL (ref 3.87–5.11)
RDW: 13 % (ref 11.5–15.5)
WBC: 13.3 10*3/uL — ABNORMAL HIGH (ref 4.0–10.5)

## 2016-09-14 LAB — LIPASE, BLOOD: LIPASE: 16 U/L (ref 11–51)

## 2016-09-14 SURGERY — APPENDECTOMY, LAPAROSCOPIC
Anesthesia: General | Site: Abdomen

## 2016-09-14 MED ORDER — SODIUM CHLORIDE 0.9 % IV BOLUS (SEPSIS)
500.0000 mL | Freq: Once | INTRAVENOUS | Status: AC
  Administered 2016-09-14: 500 mL via INTRAVENOUS

## 2016-09-14 MED ORDER — PIPERACILLIN-TAZOBACTAM 3.375 G IVPB 30 MIN
3.3750 g | Freq: Once | INTRAVENOUS | Status: AC
  Administered 2016-09-14: 3.375 g via INTRAVENOUS
  Filled 2016-09-14: qty 50

## 2016-09-14 MED ORDER — IOPAMIDOL (ISOVUE-300) INJECTION 61%
100.0000 mL | Freq: Once | INTRAVENOUS | Status: AC | PRN
  Administered 2016-09-14: 80 mL via INTRAVENOUS

## 2016-09-14 MED ORDER — HYDROMORPHONE HCL 1 MG/ML IJ SOLN
0.5000 mg | INTRAMUSCULAR | Status: DC | PRN
  Administered 2016-09-14: 1 mg via INTRAVENOUS
  Filled 2016-09-14: qty 1

## 2016-09-14 MED ORDER — SUCCINYLCHOLINE CHLORIDE 200 MG/10ML IV SOSY
PREFILLED_SYRINGE | INTRAVENOUS | Status: DC | PRN
  Administered 2016-09-14: 100 mg via INTRAVENOUS

## 2016-09-14 MED ORDER — KCL IN DEXTROSE-NACL 20-5-0.45 MEQ/L-%-% IV SOLN
INTRAVENOUS | Status: DC
  Administered 2016-09-14 – 2016-09-16 (×3): via INTRAVENOUS
  Administered 2016-09-16: 1000 mL via INTRAVENOUS
  Administered 2016-09-17 – 2016-09-19 (×4): via INTRAVENOUS
  Filled 2016-09-14 (×11): qty 1000

## 2016-09-14 MED ORDER — ONDANSETRON HCL 4 MG/2ML IJ SOLN
INTRAMUSCULAR | Status: DC | PRN
  Administered 2016-09-14: 4 mg via INTRAVENOUS

## 2016-09-14 MED ORDER — PROPOFOL 10 MG/ML IV BOLUS
INTRAVENOUS | Status: AC
  Filled 2016-09-14: qty 20

## 2016-09-14 MED ORDER — PROPOFOL 10 MG/ML IV BOLUS
INTRAVENOUS | Status: DC | PRN
  Administered 2016-09-14: 100 mg via INTRAVENOUS

## 2016-09-14 MED ORDER — FENTANYL CITRATE (PF) 250 MCG/5ML IJ SOLN
INTRAMUSCULAR | Status: AC
Start: 2016-09-14 — End: 2016-09-14
  Filled 2016-09-14: qty 5

## 2016-09-14 MED ORDER — SUCCINYLCHOLINE CHLORIDE 200 MG/10ML IV SOSY
PREFILLED_SYRINGE | INTRAVENOUS | Status: AC
  Filled 2016-09-14: qty 10

## 2016-09-14 MED ORDER — BUPIVACAINE HCL (PF) 0.25 % IJ SOLN
INTRAMUSCULAR | Status: AC
  Filled 2016-09-14: qty 30

## 2016-09-14 MED ORDER — IOPAMIDOL (ISOVUE-300) INJECTION 61%
INTRAVENOUS | Status: AC
  Filled 2016-09-14: qty 100

## 2016-09-14 MED ORDER — ONDANSETRON 4 MG PO TBDP: 4.0000 mg | ORAL_TABLET | Freq: Four times a day (QID) | ORAL | Status: DC | PRN

## 2016-09-14 MED ORDER — HYDROMORPHONE HCL 2 MG/ML IJ SOLN: 0.5000 mg | INTRAMUSCULAR | Status: DC | PRN

## 2016-09-14 MED ORDER — DEXAMETHASONE SODIUM PHOSPHATE 10 MG/ML IJ SOLN
INTRAMUSCULAR | Status: AC
  Filled 2016-09-14: qty 1

## 2016-09-14 MED ORDER — ACETAMINOPHEN 650 MG RE SUPP: 650.0000 mg | Freq: Four times a day (QID) | RECTAL | Status: DC | PRN

## 2016-09-14 MED ORDER — SUGAMMADEX SODIUM 200 MG/2ML IV SOLN
INTRAVENOUS | Status: DC | PRN
  Administered 2016-09-14: 200 mg via INTRAVENOUS

## 2016-09-14 MED ORDER — LACTATED RINGERS IR SOLN
Status: DC | PRN
  Administered 2016-09-14: 3000 mL

## 2016-09-14 MED ORDER — ACETAMINOPHEN 325 MG PO TABS
650.0000 mg | ORAL_TABLET | Freq: Four times a day (QID) | ORAL | Status: DC | PRN
  Administered 2016-09-15 – 2016-09-20 (×4): 650 mg via ORAL
  Filled 2016-09-14 (×4): qty 2

## 2016-09-14 MED ORDER — BOOST / RESOURCE BREEZE PO LIQD
1.0000 | Freq: Three times a day (TID) | ORAL | Status: DC
  Administered 2016-09-14 – 2016-09-15 (×2): 1 via ORAL

## 2016-09-14 MED ORDER — PIPERACILLIN-TAZOBACTAM 3.375 G IVPB
3.3750 g | Freq: Three times a day (TID) | INTRAVENOUS | Status: DC
  Administered 2016-09-14 – 2016-09-19 (×15): 3.375 g via INTRAVENOUS
  Filled 2016-09-14 (×15): qty 50

## 2016-09-14 MED ORDER — FENTANYL CITRATE (PF) 100 MCG/2ML IJ SOLN
INTRAMUSCULAR | Status: DC | PRN
  Administered 2016-09-14: 50 ug via INTRAVENOUS
  Administered 2016-09-14: 100 ug via INTRAVENOUS
  Administered 2016-09-14 (×2): 50 ug via INTRAVENOUS

## 2016-09-14 MED ORDER — PROMETHAZINE HCL 25 MG/ML IJ SOLN: 6.2500 mg | INTRAMUSCULAR | Status: DC | PRN

## 2016-09-14 MED ORDER — HYDROMORPHONE HCL 2 MG/ML IJ SOLN: 1.0000 mg | INTRAMUSCULAR | Status: DC | PRN

## 2016-09-14 MED ORDER — ACETAMINOPHEN 10 MG/ML IV SOLN
INTRAVENOUS | Status: DC | PRN
  Administered 2016-09-14: 1000 mg via INTRAVENOUS

## 2016-09-14 MED ORDER — HYDROCODONE-ACETAMINOPHEN 5-325 MG PO TABS
1.0000 | ORAL_TABLET | ORAL | Status: DC | PRN
  Administered 2016-09-17: 1 via ORAL
  Administered 2016-09-17: 2 via ORAL
  Administered 2016-09-17: 1 via ORAL
  Administered 2016-09-18: 2 via ORAL
  Administered 2016-09-18: 1 via ORAL
  Administered 2016-09-19: 2 via ORAL
  Filled 2016-09-14: qty 2
  Filled 2016-09-14: qty 1
  Filled 2016-09-14: qty 2
  Filled 2016-09-14 (×2): qty 1
  Filled 2016-09-14: qty 2

## 2016-09-14 MED ORDER — ONDANSETRON HCL 4 MG/2ML IJ SOLN
INTRAMUSCULAR | Status: AC
  Filled 2016-09-14: qty 2

## 2016-09-14 MED ORDER — LIDOCAINE 2% (20 MG/ML) 5 ML SYRINGE
INTRAMUSCULAR | Status: DC | PRN
  Administered 2016-09-14: 100 mg via INTRAVENOUS

## 2016-09-14 MED ORDER — LACTATED RINGERS IV SOLN
INTRAVENOUS | Status: DC | PRN
  Administered 2016-09-14 (×2): via INTRAVENOUS

## 2016-09-14 MED ORDER — BUPIVACAINE-EPINEPHRINE (PF) 0.25% -1:200000 IJ SOLN
INTRAMUSCULAR | Status: DC | PRN
  Administered 2016-09-14: 30 mL

## 2016-09-14 MED ORDER — FENTANYL CITRATE (PF) 100 MCG/2ML IJ SOLN: 25.0000 ug | INTRAMUSCULAR | Status: DC | PRN

## 2016-09-14 MED ORDER — ACETAMINOPHEN 10 MG/ML IV SOLN
INTRAVENOUS | Status: AC
  Filled 2016-09-14: qty 100

## 2016-09-14 MED ORDER — ROCURONIUM BROMIDE 10 MG/ML (PF) SYRINGE
PREFILLED_SYRINGE | INTRAVENOUS | Status: DC | PRN
  Administered 2016-09-14: 30 mg via INTRAVENOUS

## 2016-09-14 MED ORDER — 0.9 % SODIUM CHLORIDE (POUR BTL) OPTIME
TOPICAL | Status: DC | PRN
  Administered 2016-09-14: 1000 mL

## 2016-09-14 MED ORDER — DEXAMETHASONE SODIUM PHOSPHATE 10 MG/ML IJ SOLN
INTRAMUSCULAR | Status: DC | PRN
  Administered 2016-09-14: 10 mg via INTRAVENOUS

## 2016-09-14 MED ORDER — ROCURONIUM BROMIDE 50 MG/5ML IV SOSY
PREFILLED_SYRINGE | INTRAVENOUS | Status: AC
  Filled 2016-09-14: qty 5

## 2016-09-14 MED ORDER — ONDANSETRON HCL 4 MG/2ML IJ SOLN
4.0000 mg | Freq: Four times a day (QID) | INTRAMUSCULAR | Status: DC | PRN
  Administered 2016-09-15 – 2016-09-19 (×2): 4 mg via INTRAVENOUS
  Filled 2016-09-14 (×2): qty 2

## 2016-09-14 MED ORDER — ONDANSETRON HCL 4 MG/2ML IJ SOLN
4.0000 mg | Freq: Once | INTRAMUSCULAR | Status: AC
  Administered 2016-09-14: 4 mg via INTRAVENOUS
  Filled 2016-09-14: qty 2

## 2016-09-14 MED ORDER — SUGAMMADEX SODIUM 200 MG/2ML IV SOLN
INTRAVENOUS | Status: AC
  Filled 2016-09-14: qty 2

## 2016-09-14 MED ORDER — LIDOCAINE 2% (20 MG/ML) 5 ML SYRINGE
INTRAMUSCULAR | Status: AC
Start: 2016-09-14 — End: 2016-09-14
  Filled 2016-09-14: qty 5

## 2016-09-14 MED ORDER — FENTANYL CITRATE (PF) 100 MCG/2ML IJ SOLN
50.0000 ug | Freq: Once | INTRAMUSCULAR | Status: AC
  Administered 2016-09-14: 50 ug via INTRAVENOUS
  Filled 2016-09-14: qty 2

## 2016-09-14 SURGICAL SUPPLY — 35 items
APPLIER CLIP ROT 10 11.4 M/L (STAPLE)
BENZOIN TINCTURE PRP APPL 2/3 (GAUZE/BANDAGES/DRESSINGS) ×3 IMPLANT
CHLORAPREP W/TINT 26ML (MISCELLANEOUS) ×3 IMPLANT
CLIP APPLIE ROT 10 11.4 M/L (STAPLE) IMPLANT
CLOSURE STERI-STRIP 1/4X4 (GAUZE/BANDAGES/DRESSINGS) ×3 IMPLANT
CLOSURE WOUND 1/2 X4 (GAUZE/BANDAGES/DRESSINGS) ×1
COVER SURGICAL LIGHT HANDLE (MISCELLANEOUS) ×3 IMPLANT
CUTTER FLEX LINEAR 45M (STAPLE) ×3 IMPLANT
DECANTER SPIKE VIAL GLASS SM (MISCELLANEOUS) ×3 IMPLANT
DRAPE LAPAROSCOPIC ABDOMINAL (DRAPES) ×3 IMPLANT
ELECT REM PT RETURN 9FT ADLT (ELECTROSURGICAL) ×3
ELECTRODE REM PT RTRN 9FT ADLT (ELECTROSURGICAL) ×1 IMPLANT
ENDOLOOP SUT PDS II  0 18 (SUTURE)
ENDOLOOP SUT PDS II 0 18 (SUTURE) IMPLANT
GAUZE SPONGE 2X2 8PLY STRL LF (GAUZE/BANDAGES/DRESSINGS) ×1 IMPLANT
GLOVE SURG ORTHO 8.0 STRL STRW (GLOVE) ×3 IMPLANT
GOWN STRL REUS W/TWL XL LVL3 (GOWN DISPOSABLE) ×6 IMPLANT
IRRIG SUCT STRYKERFLOW 2 WTIP (MISCELLANEOUS) ×3
IRRIGATION SUCT STRKRFLW 2 WTP (MISCELLANEOUS) ×1 IMPLANT
KIT BASIN OR (CUSTOM PROCEDURE TRAY) ×3 IMPLANT
POUCH SPECIMEN RETRIEVAL 10MM (ENDOMECHANICALS) ×3 IMPLANT
RELOAD 45 VASCULAR/THIN (ENDOMECHANICALS) IMPLANT
RELOAD STAPLE TA45 3.5 REG BLU (ENDOMECHANICALS) ×3 IMPLANT
SHEARS HARMONIC ACE PLUS 36CM (ENDOMECHANICALS) ×3 IMPLANT
SPONGE GAUZE 2X2 STER 10/PKG (GAUZE/BANDAGES/DRESSINGS) ×2
STRIP CLOSURE SKIN 1/2X4 (GAUZE/BANDAGES/DRESSINGS) ×2 IMPLANT
SUT MNCRL AB 4-0 PS2 18 (SUTURE) ×3 IMPLANT
TAPE CLOTH SURG 4X10 WHT LF (GAUZE/BANDAGES/DRESSINGS) ×3 IMPLANT
TOWEL OR 17X26 10 PK STRL BLUE (TOWEL DISPOSABLE) ×3 IMPLANT
TOWEL OR NON WOVEN STRL DISP B (DISPOSABLE) ×3 IMPLANT
TRAY FOLEY W/METER SILVER 14FR (SET/KITS/TRAYS/PACK) IMPLANT
TRAY FOLEY W/METER SILVER 16FR (SET/KITS/TRAYS/PACK) ×3 IMPLANT
TRAY LAPAROSCOPIC (CUSTOM PROCEDURE TRAY) ×3 IMPLANT
TROCAR XCEL BLUNT TIP 100MML (ENDOMECHANICALS) ×3 IMPLANT
TROCAR XCEL NON-BLD 11X100MML (ENDOMECHANICALS) ×3 IMPLANT

## 2016-09-14 NOTE — Anesthesia Procedure Notes (Signed)
Procedure Name: Intubation Date/Time: 09/14/2016 3:23 PM Performed by: Danley Danker L Patient Re-evaluated:Patient Re-evaluated prior to inductionOxygen Delivery Method: Circle system utilized Preoxygenation: Pre-oxygenation with 100% oxygen Intubation Type: IV induction and Rapid sequence Ventilation: Mask ventilation without difficulty Laryngoscope Size: Miller and 2 Grade View: Grade I Tube type: Oral Tube size: 7.5 mm Number of attempts: 1 Airway Equipment and Method: Stylet Placement Confirmation: ETT inserted through vocal cords under direct vision,  positive ETCO2 and breath sounds checked- equal and bilateral Secured at: 21 cm Tube secured with: Tape Dental Injury: Teeth and Oropharynx as per pre-operative assessment

## 2016-09-14 NOTE — ED Notes (Signed)
Pt O2 sat dropped to 87%, pt placed on 2L

## 2016-09-14 NOTE — ED Triage Notes (Addendum)
Pt reports generalized abd pain for the past few days that localized to RLQ last night. No n/v/d. Had a fever a few days ago. Pt feels better today.

## 2016-09-14 NOTE — H&P (Signed)
Jessica Beck is an 76 y.o. female.    General Surgery [facility]  Chief Complaint: abdominal pain, acute appendicitis  HPI: patient is a 76 yo WF with 3 day hx of abdominal pain localizing to the RLQ.  Patient has had nausea but no emesis.  Low grade fever.  Mild diarrheal stools.  No prior abdominal surgery.  Presents to ER.  WBC elevated at 13K.  CTA positive for acute appendicitis.  Possible SBO distal jejunum (?).  General surgery called for evaluation and management.  Past Medical History:  Diagnosis Date  . Hypertension   . IBS (irritable bowel syndrome)     History reviewed. No pertinent surgical history.  History reviewed. No pertinent family history. Social History:  reports that she has quit smoking. She has never used smokeless tobacco. She reports that she drinks alcohol. Her drug history is not on file.  Allergies:  Allergies  Allergen Reactions  . Flexeril [Cyclobenzaprine] Other (See Comments)    Not reported reaction  Rash, anxiety  . Latex Other (See Comments)    Not reported reaction   . Oxycodone Rash    hives  . Sulfa Antibiotics Other (See Comments)    Not reported reaction  rash     (Not in a hospital admission)  Results for orders placed or performed during the hospital encounter of 09/14/16 (from the past 48 hour(s))  Urinalysis, Routine w reflex microscopic     Status: Abnormal   Collection Time: 09/14/16  9:24 AM  Result Value Ref Range   Color, Urine AMBER (A) YELLOW    Comment: BIOCHEMICALS MAY BE AFFECTED BY COLOR   APPearance CLEAR CLEAR   Specific Gravity, Urine 1.026 1.005 - 1.030   pH 5.0 5.0 - 8.0   Glucose, UA NEGATIVE NEGATIVE mg/dL   Hgb urine dipstick MODERATE (A) NEGATIVE   Bilirubin Urine NEGATIVE NEGATIVE   Ketones, ur 5 (A) NEGATIVE mg/dL   Protein, ur 100 (A) NEGATIVE mg/dL   Nitrite NEGATIVE NEGATIVE   Leukocytes, UA NEGATIVE NEGATIVE   RBC / HPF 0-5 0 - 5 RBC/hpf   WBC, UA 0-5 0 - 5 WBC/hpf   Bacteria, UA NONE SEEN NONE SEEN   Squamous Epithelial / LPF 0-5 (A) NONE SEEN   Mucous PRESENT    Hyaline Casts, UA PRESENT   Lipase, blood     Status: None   Collection Time: 09/14/16  9:56 AM  Result Value Ref Range   Lipase 16 11 - 51 U/L  Comprehensive metabolic panel     Status: Abnormal   Collection Time: 09/14/16  9:56 AM  Result Value Ref Range   Sodium 135 135 - 145 mmol/L   Potassium 4.2 3.5 - 5.1 mmol/L   Chloride 98 (L) 101 - 111 mmol/L   CO2 27 22 - 32 mmol/L   Glucose, Bld 108 (H) 65 - 99 mg/dL   BUN 22 (H) 6 - 20 mg/dL   Creatinine, Ser 1.18 (H) 0.44 - 1.00 mg/dL   Calcium 9.7 8.9 - 10.3 mg/dL   Total Protein 7.4 6.5 - 8.1 g/dL   Albumin 3.9 3.5 - 5.0 g/dL   AST 27 15 - 41 U/L   ALT 15 14 - 54 U/L   Alkaline Phosphatase 69 38 - 126 U/L   Total Bilirubin 1.3 (H) 0.3 - 1.2 mg/dL   GFR calc non Af Amer 44 (L) >60 mL/min   GFR calc Af Amer 51 (L) >60 mL/min  Comment: (NOTE) The eGFR has been calculated using the CKD EPI equation. This calculation has not been validated in all clinical situations. eGFR's persistently <60 mL/min signify possible Chronic Kidney Disease.    Anion gap 10 5 - 15  CBC     Status: Abnormal   Collection Time: 09/14/16  9:56 AM  Result Value Ref Range   WBC 13.3 (H) 4.0 - 10.5 K/uL   RBC 4.58 3.87 - 5.11 MIL/uL   Hemoglobin 14.2 12.0 - 15.0 g/dL   HCT 41.2 36.0 - 46.0 %   MCV 90.0 78.0 - 100.0 fL   MCH 31.0 26.0 - 34.0 pg   MCHC 34.5 30.0 - 36.0 g/dL   RDW 13.0 11.5 - 15.5 %   Platelets 201 150 - 400 K/uL   Ct Abdomen Pelvis W Contrast  Result Date: 09/14/2016 CLINICAL DATA:  Generalized abdominal pain for a few days, localizing in the right lower quadrant. EXAM: CT ABDOMEN AND PELVIS WITH CONTRAST TECHNIQUE: Multidetector CT imaging of the abdomen and pelvis was performed using the standard protocol following bolus administration of intravenous contrast. CONTRAST:  27m ISOVUE-300 IOPAMIDOL (ISOVUE-300) INJECTION 61% COMPARISON:   None. FINDINGS: Lower chest: Dependent atelectasis bilaterally. No pulmonary nodules, masses, or suspicious infiltrates. The lung bases are otherwise within normal limits. Hepatobiliary: There is a stone dependently in the gallbladder without wall thickening. The portal vein is normal. Hepatic steatosis is identified. No focal liver mass is noted. Pancreas: Unremarkable. No pancreatic ductal dilatation or surrounding inflammatory changes. Spleen: Normal in size without focal abnormality. Adrenals/Urinary Tract: A small low-attenuation lesion in the upper left kidney is too small to characterize but almost certainly a cyst. The kidneys are otherwise normal in appearance with no obstruction or perinephric stranding. No ureterectasis or ureteral stones. The bladder is normal. Stomach/Bowel: The stomach is normal. There is a small bowel obstruction. A transition point is best seen on coronal image 60 in the region of the distal jejunum. The small bowel is decompressed over a significant distance after the transition point. However, a few more distal loops are borderline in caliber. The distal ileum is relatively decompressed as well. The colon is smaller in caliber than the small bowel. There are scattered colonic diverticuli without diverticulitis. There is an appendicolith in the proximal appendix and multiple smaller appendicoliths more distally in the appendix. The appendix is distended distal to the proximal appendicolith with adjacent stranding. The findings are consistent with appendicitis. There is some reactive free fluid in the lower abdomen and pelvis but no focal abscess. No extraluminal gas is seen to suggest perforation. Vascular/Lymphatic: Mild atherosclerosis in the abdominal aorta. No adenopathy. Reproductive: There is a right ovarian cyst measuring 18 mm. The uterus and adnexae are otherwise normal. Other: No free air.  Mild free fluid in the lower abdomen pelvis. Musculoskeletal: Status post right hip  replacement. No other acute bony abnormalities. Degenerative changes in the spine. IMPRESSION: 1. Appendicitis without abscess or perforation. Multiple appendicoliths. 2. Small bowel obstruction. There is a transition point in the distal jejunum. There is a segment of decompressed bowel after this transition point followed by multiple borderline and mildly prominent loops. The more distal mildly prominent loops could be due to ileus or a second transition point. The distal ileum is decompressed. 3. Atherosclerosis. 4. Cholelithiasis. 5. Suggested 18 mm cyst in the right ovary. Recommend ultrasound for further evaluation in this postmenopausal woman. Electronically Signed   By: DDorise BullionIII M.D   On: 09/14/2016 11:28  Review of Systems  Constitutional: Positive for chills and fever. Negative for diaphoresis.  HENT: Negative.   Eyes: Negative.   Respiratory: Positive for shortness of breath.   Cardiovascular: Negative.   Gastrointestinal: Positive for abdominal pain (localized to RLQ), diarrhea and nausea. Negative for vomiting.  Genitourinary: Negative.   Musculoskeletal: Negative.   Skin: Negative.   Neurological: Negative.   Endo/Heme/Allergies: Negative.   Psychiatric/Behavioral: Negative.     Blood pressure 117/78, pulse 85, temperature 98.6 F (37 C), temperature source Oral, resp. rate 16, weight 70.3 kg (155 lb), SpO2 97 %. Physical Exam  Constitutional: She is oriented to person, place, and time. She appears well-developed and well-nourished. No distress.  HENT:  Head: Normocephalic and atraumatic.  Right Ear: External ear normal.  Left Ear: External ear normal.  Eyes: Conjunctivae are normal. Pupils are equal, round, and reactive to light.  Neck: Normal range of motion. Neck supple. No tracheal deviation present. No thyromegaly present.  Cardiovascular: Normal rate, regular rhythm and normal heart sounds.   No murmur heard. Respiratory: Effort normal and breath sounds  normal. No respiratory distress. She has no wheezes.  GI: Soft. She exhibits no distension and no mass. There is tenderness (RLQ). There is guarding. There is no rebound.  Musculoskeletal: Normal range of motion. She exhibits no edema or deformity.  Neurological: She is alert and oriented to person, place, and time.  Skin: Skin is warm and dry. She is not diaphoretic.  Psychiatric: She has a normal mood and affect. Her behavior is normal.     Assessment/Plan Acute appendicitis  Plan admission to general surgery service for appendectomy today  Begin IV abx  The risks and benefits of the procedure have been discussed at length with the patient.  The patient understands the proposed procedure, potential alternative treatments, and the course of recovery to be expected.  All of the patient's questions have been answered at this time.  The patient wishes to proceed with surgery.  Earnstine Regal, MD, [facility] Office: [place], MD 09/14/2016, 12:15 PM

## 2016-09-14 NOTE — Op Note (Signed)
OPERATIVE REPORT - LAPAROSCOPIC APPENDECTOMY  Preop diagnosis: Acute appendicitis  Postop diagnosis: acute appendicitis, gangrenous, with perforation and peritonitis  Procedure: Laparoscopic appendectomy  Surgeon:  Earnstine Regal, MD, FACS  Anesthesia: General endotracheal  Estimated blood loss: Minimal  Preparation: Chlora-prep  Complications: None  Indications:  patient is a 76 yo WF with 3 day hx of abdominal pain localizing to the RLQ.  Patient has had nausea but no emesis.  Low grade fever.  Mild diarrheal stools.  No prior abdominal surgery.  Presents to ER.  WBC elevated at 13K.  CTA positive for acute appendicitis.   Procedure:  Patient is brought to the operating room and placed in a supine position on the operating room table. Following administration of general anesthesia, a time out was held and the patient's name and procedure is confirmed. Patient is then prepped and draped in the usual strict aseptic fashion.  After ascertaining that an adequate level of anesthesia has been achieved, a peri-umbilical incision is made with a #15 blade. Dissection is carried down to the fascia. Fascia is incised in the midline and the peritoneal cavity is entered cautiously. A #0-vicryl pursestring suture is placed in the fascia. An Hassan cannula is introduced under direct vision and secured with the pursestring suture. The abdomen is insufflated with carbon dioxide. The laparoscope is introduced and the abdomen is explored. Operative ports are placed in the right upper quadrant and left lower quadrant. The appendix is identified. The appendix is gangrenous with perforation and surrounding peritonitis.  The mesoappendix is divided with the harmonic scalpel. Dissection is carried down to the base of the appendix. The base of the appendix is dissected out clearing the junction with the cecal wall. Using an Endo-GIA stapler, the base of the appendix is transected at the junction with the cecal wall.  There is good approximation of tissue along the staple line. There is good hemostasis along the staple line. The appendix is placed into an endo-catch bag and withdrawn through the umbilical port. The #0-vicryl pursestring suture is tied securely.  Right lower quadrant and pelvis is irrigated with warm saline and evacuated. Good hemostasis is noted. Ports are removed under direct vision. Good hemostasis is noted at the port sites. Pneumoperitoneum is released.  Skin incisions are anesthetized with local anesthetic. Wounds are closed with interrupted 4-0 Monocryl subcuticular sutures. Wounds are washed and dried and Steri-Strips are applied. Dressings are applied. The patient is awakened from anesthesia and brought to the recovery room. The patient tolerated the procedure well.  Earnstine Regal, MD, [facility] Office: [phone]

## 2016-09-14 NOTE — Anesthesia Preprocedure Evaluation (Addendum)
Anesthesia Evaluation  Patient identified by MRN, date of birth, ID band Patient awake    Reviewed: Allergy & Precautions, NPO status , Patient's Chart, lab work & pertinent test results  Airway Mallampati: II  TM Distance: >3 FB Neck ROM: Full    Dental  (+) Dental Advisory Given   Pulmonary former smoker,    breath sounds clear to auscultation       Cardiovascular Exercise Tolerance: Good hypertension, Pt. on medications (-) angina+ DOE   Rhythm:Regular Rate:Normal + Systolic murmurs (II/VI)    Neuro/Psych negative neurological ROS     GI/Hepatic Neg liver ROS, Acute appendicitis   Endo/Other  negative endocrine ROS  Renal/GU negative Renal ROS     Musculoskeletal   Abdominal   Peds  Hematology negative hematology ROS (+)   Anesthesia Other Findings   Reproductive/Obstetrics                            Lab Results  Component Value Date   WBC 13.3 (H) 09/14/2016   HGB 14.2 09/14/2016   HCT 41.2 09/14/2016   MCV 90.0 09/14/2016   PLT 201 09/14/2016   Lab Results  Component Value Date   CREATININE 1.18 (H) 09/14/2016   BUN 22 (H) 09/14/2016   NA 135 09/14/2016   K 4.2 09/14/2016   CL 98 (L) 09/14/2016   CO2 27 09/14/2016    Anesthesia Physical Anesthesia Plan  ASA: II  Anesthesia Plan: General   Post-op Pain Management:    Induction: Intravenous  Airway Management Planned: Oral ETT  Additional Equipment:   Intra-op Plan:   Post-operative Plan: Extubation in OR  Informed Consent: I have reviewed the patients History and Physical, chart, labs and discussed the procedure including the risks, benefits and alternatives for the proposed anesthesia with the patient or authorized representative who has indicated his/her understanding and acceptance.   Dental advisory given  Plan Discussed with: CRNA  Anesthesia Plan Comments:         Anesthesia Quick  Evaluation

## 2016-09-14 NOTE — ED Provider Notes (Signed)
[facility] DEPT Provider Note   CSN: [id number] Arrival date & time: 09/14/16  [id number]     History   Chief Complaint Chief Complaint  Patient presents with  . Abdominal Pain    HPI Jessica Beck is a 76 y.o. female.  HPI Patient presents with abdominal pain. Began a few days ago. Began somewhat diffusely but then localized the right lower quadrant. His had fevers up to 103. Has had a little bit of diarrhea. Today is less severe pain than it was but still feels bad. States the bumps in the road here were very difficult. She has had a decreased appetite.   Past Medical History:  Diagnosis Date  . Hypertension   . IBS (irritable bowel syndrome)     Patient Active Problem List   Diagnosis Date Noted  . Acute gangrenous appendicitis with perforation and peritonitis 09/14/2016    History reviewed. No pertinent surgical history.  OB History    No data available       Home Medications    Prior to Admission medications   Medication Sig Start Date End Date Taking? Authorizing Provider  aspirin EC 81 MG tablet Take 81 mg by mouth daily.   Yes Historical Provider, MD  cetirizine (ZYRTEC) 10 MG tablet Take 10 mg by mouth daily.   Yes Historical Provider, MD  Cholecalciferol (VITAMIN D3) 2000 units TABS Take 2,000 Units by mouth daily.   Yes Historical Provider, MD  co-enzyme Q-10 30 MG capsule Take 1 capsule by mouth daily.   Yes Historical Provider, MD  magnesium oxide (MAG-OX) 400 MG tablet Take 400 mg by mouth daily.   Yes Historical Provider, MD  meloxicam (MOBIC) 15 MG tablet Take 15 mg by mouth daily. 08/29/16  Yes Historical Provider, MD  Omega 3 1000 MG CAPS Take 1,000 mg by mouth daily.   Yes Historical Provider, MD  omeprazole (PRILOSEC) 20 MG capsule Take 20 mg by mouth daily. 08/29/16  Yes Historical Provider, MD  Polyethyl Glycol-Propyl Glycol (SYSTANE) 0.4-0.3 % SOLN Place 1 drop into both eyes daily.   Yes Historical Provider, MD  PROAIR HFA 108 [phone] Base)  MCG/ACT inhaler Take 1 spray by mouth daily as needed. 06/20/16  Yes Historical Provider, MD  Probiotic Product (ALIGN PO) Take 1 capsule by mouth daily.   Yes Historical Provider, MD  traMADol (ULTRAM) 50 MG tablet Take 50 mg by mouth 3 (three) times daily as needed. 03/25/16  Yes Historical Provider, MD  triamcinolone cream (KENALOG) 0.1 % Apply 1 application topically daily as needed. 11/20/15  Yes Historical Provider, MD  valsartan (DIOVAN) 160 MG tablet Take 160 mg by mouth daily. 07/14/16  Yes Historical Provider, MD  verapamil (CALAN-SR) 120 MG CR tablet Take 120 mg by mouth daily. 08/29/16  Yes Historical Provider, MD    Family History History reviewed. No pertinent family history.  Social History Social History  Substance Use Topics  . Smoking status: Former Research scientist (life sciences)  . Smokeless tobacco: Never Used  . Alcohol use Yes     Allergies   Flexeril [cyclobenzaprine]; Latex; Oxycodone; and Sulfa antibiotics   Review of Systems Review of Systems  Constitutional: Positive for appetite change and fever.  HENT: Negative for congestion.   Respiratory: Negative for shortness of breath.   Cardiovascular: Negative for chest pain.  Gastrointestinal: Positive for abdominal pain, diarrhea and nausea. Negative for blood in stool.  Genitourinary: Negative for dysuria.  Musculoskeletal: Negative for back pain.  Skin: Negative for wound.  Neurological: Negative for  headaches.  Hematological: Negative for adenopathy.  Psychiatric/Behavioral: Negative for confusion.     Physical Exam Updated Vital Signs BP (P) 131/97 (BP Location: Right Arm)   Pulse 83   Temp (P) 98.3 F (36.8 C)   Resp (!) 34   Wt 155 lb (70.3 kg)   SpO2 97%   Physical Exam  Constitutional: She appears well-developed.  HENT:  Head: Atraumatic.  Eyes: EOM are normal.  Neck: Neck supple.  Cardiovascular: Normal rate.   Pulmonary/Chest: Effort normal.  Abdominal: There is tenderness.  Somewhat diffuse tenderness  but localized severe tenderness with some peritonitis and right lower quadrant. Patient is lying stiff in bed.  Musculoskeletal: She exhibits no tenderness.  Neurological: She is alert.  Skin: Skin is warm.  Psychiatric: She has a normal mood and affect.     ED Treatments / Results  Labs (all labs ordered are listed, but only abnormal results are displayed) Labs Reviewed  COMPREHENSIVE METABOLIC PANEL - Abnormal; Notable for the following:       Result Value   Chloride 98 (*)    Glucose, Bld 108 (*)    BUN 22 (*)    Creatinine, Ser 1.18 (*)    Total Bilirubin 1.3 (*)    GFR calc non Af Amer 44 (*)    GFR calc Af Amer 51 (*)    All other components within normal limits  CBC - Abnormal; Notable for the following:    WBC 13.3 (*)    All other components within normal limits  URINALYSIS, ROUTINE W REFLEX MICROSCOPIC - Abnormal; Notable for the following:    Color, Urine AMBER (*)    Hgb urine dipstick MODERATE (*)    Ketones, ur 5 (*)    Protein, ur 100 (*)    Squamous Epithelial / LPF 0-5 (*)    All other components within normal limits  LIPASE, BLOOD  SURGICAL PATHOLOGY    EKG  EKG Interpretation None       Radiology Ct Abdomen Pelvis W Contrast  Result Date: 09/14/2016 CLINICAL DATA:  Generalized abdominal pain for a few days, localizing in the right lower quadrant. EXAM: CT ABDOMEN AND PELVIS WITH CONTRAST TECHNIQUE: Multidetector CT imaging of the abdomen and pelvis was performed using the standard protocol following bolus administration of intravenous contrast. CONTRAST:  71mL ISOVUE-300 IOPAMIDOL (ISOVUE-300) INJECTION 61% COMPARISON:  None. FINDINGS: Lower chest: Dependent atelectasis bilaterally. No pulmonary nodules, masses, or suspicious infiltrates. The lung bases are otherwise within normal limits. Hepatobiliary: There is a stone dependently in the gallbladder without wall thickening. The portal vein is normal. Hepatic steatosis is identified. No focal liver  mass is noted. Pancreas: Unremarkable. No pancreatic ductal dilatation or surrounding inflammatory changes. Spleen: Normal in size without focal abnormality. Adrenals/Urinary Tract: A small low-attenuation lesion in the upper left kidney is too small to characterize but almost certainly a cyst. The kidneys are otherwise normal in appearance with no obstruction or perinephric stranding. No ureterectasis or ureteral stones. The bladder is normal. Stomach/Bowel: The stomach is normal. There is a small bowel obstruction. A transition point is best seen on coronal image 60 in the region of the distal jejunum. The small bowel is decompressed over a significant distance after the transition point. However, a few more distal loops are borderline in caliber. The distal ileum is relatively decompressed as well. The colon is smaller in caliber than the small bowel. There are scattered colonic diverticuli without diverticulitis. There is an appendicolith in the proximal appendix  and multiple smaller appendicoliths more distally in the appendix. The appendix is distended distal to the proximal appendicolith with adjacent stranding. The findings are consistent with appendicitis. There is some reactive free fluid in the lower abdomen and pelvis but no focal abscess. No extraluminal gas is seen to suggest perforation. Vascular/Lymphatic: Mild atherosclerosis in the abdominal aorta. No adenopathy. Reproductive: There is a right ovarian cyst measuring 18 mm. The uterus and adnexae are otherwise normal. Other: No free air.  Mild free fluid in the lower abdomen pelvis. Musculoskeletal: Status post right hip replacement. No other acute bony abnormalities. Degenerative changes in the spine. IMPRESSION: 1. Appendicitis without abscess or perforation. Multiple appendicoliths. 2. Small bowel obstruction. There is a transition point in the distal jejunum. There is a segment of decompressed bowel after this transition point followed by  multiple borderline and mildly prominent loops. The more distal mildly prominent loops could be due to ileus or a second transition point. The distal ileum is decompressed. 3. Atherosclerosis. 4. Cholelithiasis. 5. Suggested 18 mm cyst in the right ovary. Recommend ultrasound for further evaluation in this postmenopausal woman. Electronically Signed   By: Dorise Bullion III M.D   On: 09/14/2016 11:28    Procedures Procedures (including critical care time)  Medications Ordered in ED Medications  iopamidol (ISOVUE-300) 61 % injection (not administered)  HYDROmorphone (DILAUDID) injection 0.5-2 mg (1 mg Intravenous Given 09/14/16 1231)  promethazine (PHENERGAN) injection 6.25-12.5 mg (not administered)  fentaNYL (SUBLIMAZE) injection 25-50 mcg (not administered)  sodium chloride 0.9 % bolus 500 mL (500 mLs Intravenous New Bag/Given 09/14/16 1004)  ondansetron (ZOFRAN) injection 4 mg (4 mg Intravenous Given 09/14/16 1004)  fentaNYL (SUBLIMAZE) injection 50 mcg (50 mcg Intravenous Given 09/14/16 1005)  piperacillin-tazobactam (ZOSYN) IVPB 3.375 g (0 g Intravenous Stopped 09/14/16 1041)  iopamidol (ISOVUE-300) 61 % injection 100 mL (80 mLs Intravenous Contrast Given 09/14/16 1049)     Initial Impression / Assessment and Plan / ED Course  I have reviewed the triage vital signs and the nursing notes.  Pertinent labs & imaging results that were available during my care of the patient were reviewed by me and considered in my medical decision making (see chart for details).     Patient with abdominal pain. Found to have appendicitis on CT. Discussed with Dr. Harlow Asa, will admit the patient. Doubt small bowel obstruction at this time.  Final Clinical Impressions(s) / ED Diagnoses   Final diagnoses:  Appendicitis, acute    New Prescriptions New Prescriptions   No medications on file     Davonna Belling, MD 09/14/16 1643

## 2016-09-14 NOTE — Anesthesia Postprocedure Evaluation (Signed)
Anesthesia Post Note  Patient: Jessica Beck  Procedure(s) Performed: Procedure(s) (LRB): APPENDECTOMY LAPAROSCOPIC (N/A)  Patient location during evaluation: PACU Anesthesia Type: General Level of consciousness: awake and alert Pain management: pain level controlled Vital Signs Assessment: post-procedure vital signs reviewed and stable Respiratory status: spontaneous breathing, nonlabored ventilation, respiratory function stable and patient connected to nasal cannula oxygen Cardiovascular status: blood pressure returned to baseline and stable Postop Assessment: no signs of nausea or vomiting Anesthetic complications: no       Last Vitals:  Vitals:   09/14/16 1630 09/14/16 1645  BP: 131/97 132/65  Pulse: 83 76  Resp: (!) 34 19  Temp: 36.8 C     Last Pain:  Vitals:   09/14/16 1340  TempSrc:   PainSc: 5                  Tiajuana Amass

## 2016-09-14 NOTE — Transfer of Care (Signed)
Immediate Anesthesia Transfer of Care Note  Patient: Jessica Beck  Procedure(s) Performed: Procedure(s): APPENDECTOMY LAPAROSCOPIC (N/A)  Patient Location: PACU  Anesthesia Type:General  Level of Consciousness: awake and alert   Airway & Oxygen Therapy: Patient Spontanous Breathing and Patient connected to nasal cannula oxygen  Post-op Assessment: Report given to RN and Post -op Vital signs reviewed and stable  Post vital signs: Reviewed and stable  Last Vitals:  Vitals:   09/14/16 1220 09/14/16 1402  BP: 116/65 117/87  Pulse: 84 81  Resp: 16 16  Temp:      Last Pain:  Vitals:   09/14/16 1340  TempSrc:   PainSc: 5          Complications: No apparent anesthesia complications

## 2016-09-15 DIAGNOSIS — K760 Fatty (change of) liver, not elsewhere classified: Secondary | ICD-10-CM | POA: Diagnosis present

## 2016-09-15 DIAGNOSIS — I10 Essential (primary) hypertension: Secondary | ICD-10-CM | POA: Diagnosis present

## 2016-09-15 DIAGNOSIS — Z87891 Personal history of nicotine dependence: Secondary | ICD-10-CM | POA: Diagnosis not present

## 2016-09-15 DIAGNOSIS — M1990 Unspecified osteoarthritis, unspecified site: Secondary | ICD-10-CM | POA: Diagnosis present

## 2016-09-15 DIAGNOSIS — Z79899 Other long term (current) drug therapy: Secondary | ICD-10-CM | POA: Diagnosis not present

## 2016-09-15 DIAGNOSIS — Z7982 Long term (current) use of aspirin: Secondary | ICD-10-CM | POA: Diagnosis not present

## 2016-09-15 DIAGNOSIS — K567 Ileus, unspecified: Secondary | ICD-10-CM | POA: Diagnosis not present

## 2016-09-15 DIAGNOSIS — K3580 Unspecified acute appendicitis: Secondary | ICD-10-CM | POA: Diagnosis not present

## 2016-09-15 DIAGNOSIS — K352 Acute appendicitis with generalized peritonitis: Secondary | ICD-10-CM | POA: Diagnosis present

## 2016-09-15 DIAGNOSIS — I700 Atherosclerosis of aorta: Secondary | ICD-10-CM | POA: Diagnosis present

## 2016-09-15 DIAGNOSIS — Z96641 Presence of right artificial hip joint: Secondary | ICD-10-CM | POA: Diagnosis present

## 2016-09-15 MED ORDER — PANTOPRAZOLE SODIUM 40 MG IV SOLR
40.0000 mg | INTRAVENOUS | Status: DC
  Administered 2016-09-15 – 2016-09-16 (×2): 40 mg via INTRAVENOUS
  Filled 2016-09-15 (×2): qty 40

## 2016-09-16 ENCOUNTER — Encounter (HOSPITAL_COMMUNITY): Payer: Self-pay | Admitting: Surgery

## 2016-09-16 MED ORDER — VERAPAMIL HCL ER 120 MG PO TBCR
120.0000 mg | EXTENDED_RELEASE_TABLET | Freq: Every day | ORAL | Status: DC
  Administered 2016-09-16 – 2016-09-19 (×4): 120 mg via ORAL
  Filled 2016-09-16 (×5): qty 1

## 2016-09-16 MED ORDER — IRBESARTAN 150 MG PO TABS
150.0000 mg | ORAL_TABLET | Freq: Every day | ORAL | Status: DC
  Administered 2016-09-16 – 2016-09-20 (×5): 150 mg via ORAL
  Filled 2016-09-16 (×6): qty 1

## 2016-09-16 NOTE — Progress Notes (Signed)
Assessment Principal Problem:   Acute gangrenous appendicitis with perforation and peritonitis s/p laparoscopic appendectomy 09/14/16 (Dr. Craige Cotta ileus Active Problems: HTN-BP elevated.      Plan:  Full liquids.  Restart antihypertensives.  Make Protonix BID.  Continue IV Zosyn.   LOS: 1 day     2 Days Post-Op  Subjective: A little nausea.  Does not like the broth.  Passed a little gas and had loose BM. Feels a little bloated.  We discussed operative findings. Having some reflux symptoms.  Objective: Vital signs in last 24 hours: Temp:  [97.9 F (36.6 C)-99.2 F (37.3 C)] 98 F (36.7 C) (02/12 0523) Pulse Rate:  [72-77] 72 (02/12 0523) Resp:  [16-18] 18 (02/12 0523) BP: (145-169)/(70-85) 160/80 (02/12 0523) SpO2:  [93 %-99 %] 93 % (02/12 0523) Last BM Date: 09/15/16  Intake/Output from previous day: 02/11 0701 - 02/12 0700 In: 2072.5 [P.O.:240; I.V.:1832.5] Out: 1500 [Urine:1500] Intake/Output this shift: No intake/output data recorded.  PE: General- In NAD Abdomen-soft, hypoactive bowel sounds, dressings dry.  Lab Results:   Recent Labs  09/14/16 0956  WBC 13.3*  HGB 14.2  HCT 41.2  PLT 201   BMET  Recent Labs  09/14/16 0956  NA 135  K 4.2  CL 98*  CO2 27  GLUCOSE 108*  BUN 22*  CREATININE 1.18*  CALCIUM 9.7   PT/INR No results for input(s): LABPROT, INR in the last 72 hours. Comprehensive Metabolic Panel:    Component Value Date/Time   NA 135 09/14/2016 0956   NA 139 05/04/2010 0410   K 4.2 09/14/2016 0956   K 4.0 05/04/2010 0410   CL 98 (L) 09/14/2016 0956   CL 104 05/04/2010 0410   CO2 27 09/14/2016 0956   CO2 29 05/04/2010 0410   BUN 22 (H) 09/14/2016 0956   BUN 6 05/04/2010 0410   CREATININE 1.18 (H) 09/14/2016 0956   CREATININE 0.90 05/04/2010 0410   GLUCOSE 108 (H) 09/14/2016 0956   GLUCOSE 135 (H) 05/04/2010 0410   CALCIUM 9.7 09/14/2016 0956   CALCIUM 9.0 05/04/2010 0410   AST 27 09/14/2016 0956   AST 37 04/24/2010  1335   ALT 15 09/14/2016 0956   ALT 36 (H) 04/24/2010 1335   ALKPHOS 69 09/14/2016 0956   ALKPHOS 60 04/24/2010 1335   BILITOT 1.3 (H) 09/14/2016 0956   BILITOT 0.5 04/24/2010 1335   PROT 7.4 09/14/2016 0956   PROT 6.7 04/24/2010 1335   ALBUMIN 3.9 09/14/2016 0956   ALBUMIN 4.1 04/24/2010 1335     Studies/Results: Ct Abdomen Pelvis W Contrast  Result Date: 09/14/2016 CLINICAL DATA:  Generalized abdominal pain for a few days, localizing in the right lower quadrant. EXAM: CT ABDOMEN AND PELVIS WITH CONTRAST TECHNIQUE: Multidetector CT imaging of the abdomen and pelvis was performed using the standard protocol following bolus administration of intravenous contrast. CONTRAST:  60mL ISOVUE-300 IOPAMIDOL (ISOVUE-300) INJECTION 61% COMPARISON:  None. FINDINGS: Lower chest: Dependent atelectasis bilaterally. No pulmonary nodules, masses, or suspicious infiltrates. The lung bases are otherwise within normal limits. Hepatobiliary: There is a stone dependently in the gallbladder without wall thickening. The portal vein is normal. Hepatic steatosis is identified. No focal liver mass is noted. Pancreas: Unremarkable. No pancreatic ductal dilatation or surrounding inflammatory changes. Spleen: Normal in size without focal abnormality. Adrenals/Urinary Tract: A small low-attenuation lesion in the upper left kidney is too small to characterize but almost certainly a cyst. The kidneys are otherwise normal in appearance with no obstruction or perinephric stranding. No  ureterectasis or ureteral stones. The bladder is normal. Stomach/Bowel: The stomach is normal. There is a small bowel obstruction. A transition point is best seen on coronal image 60 in the region of the distal jejunum. The small bowel is decompressed over a significant distance after the transition point. However, a few more distal loops are borderline in caliber. The distal ileum is relatively decompressed as well. The colon is smaller in caliber than  the small bowel. There are scattered colonic diverticuli without diverticulitis. There is an appendicolith in the proximal appendix and multiple smaller appendicoliths more distally in the appendix. The appendix is distended distal to the proximal appendicolith with adjacent stranding. The findings are consistent with appendicitis. There is some reactive free fluid in the lower abdomen and pelvis but no focal abscess. No extraluminal gas is seen to suggest perforation. Vascular/Lymphatic: Mild atherosclerosis in the abdominal aorta. No adenopathy. Reproductive: There is a right ovarian cyst measuring 18 mm. The uterus and adnexae are otherwise normal. Other: No free air.  Mild free fluid in the lower abdomen pelvis. Musculoskeletal: Status post right hip replacement. No other acute bony abnormalities. Degenerative changes in the spine. IMPRESSION: 1. Appendicitis without abscess or perforation. Multiple appendicoliths. 2. Small bowel obstruction. There is a transition point in the distal jejunum. There is a segment of decompressed bowel after this transition point followed by multiple borderline and mildly prominent loops. The more distal mildly prominent loops could be due to ileus or a second transition point. The distal ileum is decompressed. 3. Atherosclerosis. 4. Cholelithiasis. 5. Suggested 18 mm cyst in the right ovary. Recommend ultrasound for further evaluation in this postmenopausal woman. Electronically Signed   By: Dorise Bullion III M.D   On: 09/14/2016 11:28    Anti-infectives: Anti-infectives    Start     Dose/Rate Route Frequency Ordered Stop   09/14/16 1830  piperacillin-tazobactam (ZOSYN) IVPB 3.375 g     3.375 g 12.5 mL/hr over 240 Minutes Intravenous Every 8 hours 09/14/16 1814     09/14/16 1000  piperacillin-tazobactam (ZOSYN) IVPB 3.375 g     3.375 g 100 mL/hr over 30 Minutes Intravenous  Once 09/14/16 0952 09/14/16 1041       Jessica Beck 09/16/2016

## 2016-09-17 LAB — C DIFFICILE QUICK SCREEN W PCR REFLEX
C DIFFICILE (CDIFF) TOXIN: NEGATIVE
C DIFFICLE (CDIFF) ANTIGEN: NEGATIVE
C Diff interpretation: NOT DETECTED

## 2016-09-17 MED ORDER — ENOXAPARIN SODIUM 40 MG/0.4ML ~~LOC~~ SOLN
40.0000 mg | SUBCUTANEOUS | Status: DC
  Administered 2016-09-17 – 2016-09-19 (×2): 40 mg via SUBCUTANEOUS
  Filled 2016-09-17 (×2): qty 0.4

## 2016-09-17 MED ORDER — SACCHAROMYCES BOULARDII 250 MG PO CAPS
250.0000 mg | ORAL_CAPSULE | Freq: Two times a day (BID) | ORAL | Status: DC
  Administered 2016-09-17 – 2016-09-20 (×7): 250 mg via ORAL
  Filled 2016-09-17 (×7): qty 1

## 2016-09-17 MED ORDER — ZOLPIDEM TARTRATE 5 MG PO TABS
5.0000 mg | ORAL_TABLET | Freq: Every evening | ORAL | Status: DC | PRN
  Administered 2016-09-17 – 2016-09-18 (×3): 5 mg via ORAL
  Filled 2016-09-17 (×4): qty 1

## 2016-09-17 MED ORDER — PANTOPRAZOLE SODIUM 40 MG PO TBEC
40.0000 mg | DELAYED_RELEASE_TABLET | Freq: Every day | ORAL | Status: DC
  Administered 2016-09-17 – 2016-09-20 (×4): 40 mg via ORAL
  Filled 2016-09-17 (×4): qty 1

## 2016-09-17 NOTE — Progress Notes (Signed)
1 day postop Subjective: Complains of some soreness. No flatus yet  Objective: Vital signs in last 24 hours: Temp:  [98.6 F (37 C)-99 F (37.2 C)] 99 F (37.2 C) (02/13 0534) Pulse Rate:  [65-74] 74 (02/13 0534) Resp:  [16-18] 16 (02/13 0534) BP: (143-160)/(68-79) 148/68 (02/13 0931) SpO2:  [94 %-96 %] 94 % (02/13 0534) Last BM Date: 09/17/16  Intake/Output from previous day: 02/12 0701 - 02/13 0700 In: 2407.5 [P.O.:640; I.V.:1767.5] Out: 3100 [Urine:3100] Intake/Output this shift: Total I/O In: 400 [I.V.:300; IV Piggyback:100] Out: 800 [Urine:800]  Resp: clear to auscultation bilaterally Cardio: regular rate and rhythm GI: soft, appropriately tender. incisions look good. quiet  Lab Results:  No results for input(s): WBC, HGB, HCT, PLT in the last 72 hours. BMET No results for input(s): NA, K, CL, CO2, GLUCOSE, BUN, CREATININE, CALCIUM in the last 72 hours. PT/INR No results for input(s): LABPROT, INR in the last 72 hours. ABG No results for input(s): PHART, HCO3 in the last 72 hours.  Invalid input(s): PCO2, PO2  Studies/Results: No results found.  Anti-infectives: Anti-infectives    Start     Dose/Rate Route Frequency Ordered Stop   09/14/16 1830  piperacillin-tazobactam (ZOSYN) IVPB 3.375 g     3.375 g 12.5 mL/hr over 240 Minutes Intravenous Every 8 hours 09/14/16 1814     09/14/16 1000  piperacillin-tazobactam (ZOSYN) IVPB 3.375 g     3.375 g 100 mL/hr over 30 Minutes Intravenous  Once 09/14/16 0952 09/14/16 1041      Assessment/Plan: s/p Procedure(s): APPENDECTOMY LAPAROSCOPIC (N/A) stay with clears until bowel function returns  Continue abx  LOS: 1 day   TOTH III,Casmir Auguste S 09/15/2016

## 2016-09-17 NOTE — Progress Notes (Signed)

## 2016-09-17 NOTE — Progress Notes (Signed)
3 Days Post-Op  Subjective: She is rather miserable this morning, having a lot of diarrhea 7 times over the last 24 hours. 3 of those this morning. She is worried her incisions are going to become soiled so I left her dressings in place.  Objective: Vital signs in last 24 hours: Temp:  [98.6 F (37 C)-99 F (37.2 C)] 99 F (37.2 C) (02/13 0534) Pulse Rate:  [65-74] 74 (02/13 0534) Resp:  [16-18] 16 (02/13 0534) BP: (143-160)/(78-79) 143/78 (02/13 0534) SpO2:  [94 %-96 %] 94 % (02/13 0534) Last BM Date: 09/17/16 640 PO 1800 IV 3100 urine BM x 4 Afebrile, VSS No labs  Intake/Output from previous day: 02/12 0701 - 02/13 0700 In: 2407.5 [P.O.:640; I.V.:1767.5] Out: 3100 [Urine:3100] Intake/Output this shift: Total I/O In: -  Out: 600 [Urine:600]  General appearance: alert, cooperative and no distress Resp: clear to auscultation bilaterally GI: Abdomen soft, dressings intact no swelling positive bowel sounds having a good deal of diarrhea.  Lab Results:   Recent Labs  09/14/16 0956  WBC 13.3*  HGB 14.2  HCT 41.2  PLT 201    BMET  Recent Labs  09/14/16 0956  NA 135  K 4.2  CL 98*  CO2 27  GLUCOSE 108*  BUN 22*  CREATININE 1.18*  CALCIUM 9.7   PT/INR No results for input(s): LABPROT, INR in the last 72 hours.   Recent Labs Lab 09/14/16 0956  AST 27  ALT 15  ALKPHOS 69  BILITOT 1.3*  PROT 7.4  ALBUMIN 3.9     Lipase     Component Value Date/Time   LIPASE 16 09/14/2016 0956     Studies/Results: No results found. Prior to Admission medications   Medication Sig Start Date End Date Taking? Authorizing Provider  aspirin EC 81 MG tablet Take 81 mg by mouth daily.   Yes Historical Provider, MD  cetirizine (ZYRTEC) 10 MG tablet Take 10 mg by mouth daily.   Yes Historical Provider, MD  Cholecalciferol (VITAMIN D3) 2000 units TABS Take 2,000 Units by mouth daily.   Yes Historical Provider, MD  co-enzyme Q-10 30 MG capsule Take 1 capsule by mouth  daily.   Yes Historical Provider, MD  magnesium oxide (MAG-OX) 400 MG tablet Take 400 mg by mouth daily.   Yes Historical Provider, MD  meloxicam (MOBIC) 15 MG tablet Take 15 mg by mouth daily. 08/29/16  Yes Historical Provider, MD  Omega 3 1000 MG CAPS Take 1,000 mg by mouth daily.   Yes Historical Provider, MD  omeprazole (PRILOSEC) 20 MG capsule Take 20 mg by mouth daily. 08/29/16  Yes Historical Provider, MD  Polyethyl Glycol-Propyl Glycol (SYSTANE) 0.4-0.3 % SOLN Place 1 drop into both eyes daily.   Yes Historical Provider, MD  PROAIR HFA 108 [phone] Base) MCG/ACT inhaler Take 1 spray by mouth daily as needed. 06/20/16  Yes Historical Provider, MD  Probiotic Product (ALIGN PO) Take 1 capsule by mouth daily.   Yes Historical Provider, MD  traMADol (ULTRAM) 50 MG tablet Take 50 mg by mouth 3 (three) times daily as needed. 03/25/16  Yes Historical Provider, MD  triamcinolone cream (KENALOG) 0.1 % Apply 1 application topically daily as needed. 11/20/15  Yes Historical Provider, MD  valsartan (DIOVAN) 160 MG tablet Take 160 mg by mouth daily. 07/14/16  Yes Historical Provider, MD  verapamil (CALAN-SR) 120 MG CR tablet Take 120 mg by mouth daily. 08/29/16  Yes Historical Provider, MD    Medications: . feeding supplement  1  Container Oral TID BM  . irbesartan  150 mg Oral Daily  . pantoprazole (PROTONIX) IV  40 mg Intravenous Q24H  . piperacillin-tazobactam (ZOSYN)  IV  3.375 g Intravenous Q8H  . verapamil  120 mg Oral Daily   . dextrose 5 % and 0.45 % NaCl with KCl 20 mEq/L 1,000 mL (09/16/16 2329)   Assessment/Plan Acute appendicitis, gangrenous, with perforation and peritonitis Status post laparoscopic appendectomy 09/14/16, Dr. Armandina Gemma POD #3 Hypertension History of IBS FEN:IV fluids/Full liquids ID: Zosyn 09/14/16 =>> day 4 DVT: SCD, add Lovenox 08/17/16    Plan: I'm going continue her IV fluids are currently at 75 mils per hour. Check a C. difficile. Recheck labs in a.m. I'll put her on  a soft diet, but I doubt she'll take much at this time.     LOS: 2 days    Jessica Beck 09/17/2016 [phone]

## 2016-09-18 LAB — CBC
HEMATOCRIT: 34.7 % — AB (ref 36.0–46.0)
Hemoglobin: 11.8 g/dL — ABNORMAL LOW (ref 12.0–15.0)
MCH: 30.5 pg (ref 26.0–34.0)
MCHC: 34 g/dL (ref 30.0–36.0)
MCV: 89.7 fL (ref 78.0–100.0)
PLATELETS: 218 10*3/uL (ref 150–400)
RBC: 3.87 MIL/uL (ref 3.87–5.11)
RDW: 13.2 % (ref 11.5–15.5)
WBC: 8.4 10*3/uL (ref 4.0–10.5)

## 2016-09-18 LAB — BASIC METABOLIC PANEL
ANION GAP: 4 — AB (ref 5–15)
BUN: 7 mg/dL (ref 6–20)
CALCIUM: 8.9 mg/dL (ref 8.9–10.3)
CO2: 29 mmol/L (ref 22–32)
Chloride: 105 mmol/L (ref 101–111)
Creatinine, Ser: 0.84 mg/dL (ref 0.44–1.00)
Glucose, Bld: 113 mg/dL — ABNORMAL HIGH (ref 65–99)
Potassium: 4.1 mmol/L (ref 3.5–5.1)
Sodium: 138 mmol/L (ref 135–145)

## 2016-09-18 MED ORDER — ASPIRIN EC 81 MG PO TBEC
81.0000 mg | DELAYED_RELEASE_TABLET | Freq: Every day | ORAL | Status: DC
  Administered 2016-09-18 – 2016-09-20 (×3): 81 mg via ORAL
  Filled 2016-09-18 (×3): qty 1

## 2016-09-18 MED ORDER — MELOXICAM 15 MG PO TABS
15.0000 mg | ORAL_TABLET | Freq: Every day | ORAL | Status: DC
Start: 2016-09-18 — End: 2016-09-20
  Administered 2016-09-18 – 2016-09-20 (×3): 15 mg via ORAL
  Filled 2016-09-18 (×4): qty 1

## 2016-09-18 MED ORDER — TRAMADOL HCL 50 MG PO TABS: 50.0000 mg | ORAL_TABLET | Freq: Three times a day (TID) | ORAL | Status: DC | PRN

## 2016-09-18 MED ORDER — ALBUTEROL SULFATE (2.5 MG/3ML) 0.083% IN NEBU: 2.5000 mg | INHALATION_SOLUTION | Freq: Every day | RESPIRATORY_TRACT | Status: DC | PRN

## 2016-09-18 MED ORDER — ALBUTEROL SULFATE HFA 108 (90 BASE) MCG/ACT IN AERS: 1.0000 | INHALATION_SPRAY | Freq: Every day | RESPIRATORY_TRACT | Status: DC | PRN

## 2016-09-18 NOTE — Progress Notes (Signed)
4 Days Post-Op  Subjective: She feels a lot better today., Diarrhea is better. Port sites look fine.  Objective: Vital signs in last 24 hours: Temp:  [97.7 F (36.5 C)-99.7 F (37.6 C)] 98.2 F (36.8 C) (02/14 0545) Pulse Rate:  [66-69] 68 (02/14 0545) Resp:  [16] 16 (02/14 0545) BP: (123-149)/(64-81) 149/81 (02/14 0545) SpO2:  [95 %] 95 % (02/14 0545) Last BM Date: 09/17/16 320 PO 2000 IV 2450 urine BM x 2 Afebrile, VSS Labs OK this AM Intake/Output from previous day: 02/13 0701 - 02/14 0700 In: 2310 [P.O.:320; I.V.:1710; IV Piggyback:200] Out: 2450 [Urine:2450] Intake/Output this shift: No intake/output data recorded.  General appearance: alert, cooperative and no distress Resp: clear to auscultation bilaterally GI: Soft, still sore and tender. Few bowel sounds, port sites look good no distention.  Lab Results:   Recent Labs  09/18/16 0522  WBC 8.4  HGB 11.8*  HCT 34.7*  PLT 218    BMET  Recent Labs  09/18/16 0522  NA 138  K 4.1  CL 105  CO2 29  GLUCOSE 113*  BUN 7  CREATININE 0.84  CALCIUM 8.9   PT/INR No results for input(s): LABPROT, INR in the last 72 hours.   Recent Labs Lab 09/14/16 0956  AST 27  ALT 15  ALKPHOS 69  BILITOT 1.3*  PROT 7.4  ALBUMIN 3.9     Lipase     Component Value Date/Time   LIPASE 16 09/14/2016 0956     Studies/Results: No results found. Prior to Admission medications   Medication Sig Start Date End Date Taking? Authorizing Provider  aspirin EC 81 MG tablet Take 81 mg by mouth daily.   Yes Historical Provider, MD  cetirizine (ZYRTEC) 10 MG tablet Take 10 mg by mouth daily.   Yes Historical Provider, MD  Cholecalciferol (VITAMIN D3) 2000 units TABS Take 2,000 Units by mouth daily.   Yes Historical Provider, MD  co-enzyme Q-10 30 MG capsule Take 1 capsule by mouth daily.   Yes Historical Provider, MD  magnesium oxide (MAG-OX) 400 MG tablet Take 400 mg by mouth daily.   Yes Historical Provider, MD   meloxicam (MOBIC) 15 MG tablet Take 15 mg by mouth daily. 08/29/16  Yes Historical Provider, MD  Omega 3 1000 MG CAPS Take 1,000 mg by mouth daily.   Yes Historical Provider, MD  omeprazole (PRILOSEC) 20 MG capsule Take 20 mg by mouth daily. 08/29/16  Yes Historical Provider, MD  Polyethyl Glycol-Propyl Glycol (SYSTANE) 0.4-0.3 % SOLN Place 1 drop into both eyes daily.   Yes Historical Provider, MD  PROAIR HFA 108 [phone] Base) MCG/ACT inhaler Take 1 spray by mouth daily as needed. 06/20/16  Yes Historical Provider, MD  Probiotic Product (ALIGN PO) Take 1 capsule by mouth daily.   Yes Historical Provider, MD  traMADol (ULTRAM) 50 MG tablet Take 50 mg by mouth 3 (three) times daily as needed. 03/25/16  Yes Historical Provider, MD  triamcinolone cream (KENALOG) 0.1 % Apply 1 application topically daily as needed. 11/20/15  Yes Historical Provider, MD  valsartan (DIOVAN) 160 MG tablet Take 160 mg by mouth daily. 07/14/16  Yes Historical Provider, MD  verapamil (CALAN-SR) 120 MG CR tablet Take 120 mg by mouth daily. 08/29/16  Yes Historical Provider, MD    Medications: . enoxaparin (LOVENOX) injection  40 mg Subcutaneous Q24H  . feeding supplement  1 Container Oral TID BM  . irbesartan  150 mg Oral Daily  . pantoprazole  40 mg Oral Daily  .  piperacillin-tazobactam (ZOSYN)  IV  3.375 g Intravenous Q8H  . saccharomyces boulardii  250 mg Oral BID  . verapamil  120 mg Oral Daily   . dextrose 5 % and 0.45 % NaCl with KCl 20 mEq/L 75 mL/hr at 09/18/16 0600   Assessment/Plan Acute appendicitis, gangrenous, with perforation and peritonitis Status post laparoscopic appendectomy 09/14/16, Dr. Armandina Gemma POD #4 Hypertension History of IBS Arthritis - chronic tramadol and Mobic for pain. FEN:IV fluids/Full liquids ID: Zosyn 09/14/16 =>> day 5 DVT: SCD/ Lovenox   Plan: Advance her diet as tolerated to softs.  Mobilize more now that her diarrhea is better. Decrease IV fluids. Restart her home meds for her  arthritis pain.  LOS: 3 days    Jessica Beck 09/18/2016 [phone]

## 2016-09-18 NOTE — Discharge Summary (Signed)
Physician Discharge Summary  Patient ID: Jessica Beck MRN: [id number] DOB/AGE: 1940/09/25 76 y.o.  Admit date: 09/14/2016 Discharge date: 09/20/2016  Admission Diagnoses:  Acute appendicitis History of hypertension History of irritable bowel syndrome History of arthritis on tramadol and NSAID  Discharge Diagnoses:  Acute appendicitis, gangrenous, with perforation and peritonitis Status post laparoscopic appendectomy 09/14/16, Dr. Armandina Gemma  Hypertension History of IBS Arthritis  -  on Tramadol and Mobic at home, chronic pain control   Principal Problem:   Acute gangrenous perforated appendicitis s/p lap appendectomy 09/14/2016 Active Problems:   Acute appendicitis   Malnutrition of moderate degree   PROCEDURES: Laparoscopic appendectomy, 09/14/16, Dr. Hyman Bible Course:  patient is a 76 yo WF with 3 day hx of abdominal pain localizing to the RLQ.  Patient has had nausea but no emesis.  Low grade fever.  Mild diarrheal stools.  No prior abdominal surgery.  Presents to ER.  WBC elevated at 13K.  CTA positive for acute appendicitis.  Possible SBO distal jejunum (?).  General surgery called for evaluation and management. She was seen in the emergency department by Dr. Harlow Asa, and admitted. She was taken the operating room later that afternoon. She was found to have acute appendicitis. The appendix was gangrenous with perforation and peritonitis. She tolerated procedure well was returned to the floor. She was maintained on IV fluids and antibiotics. Over the next 48 hours she developed some significant diarrhea. She was slowly mobilized and kept on IV fluids and antibiotics. By 09/18/16 she was feeling better. Her diarrhea had improved. Tests for C. difficile colitis were negative. We mobilized her more on her fourth postoperative day and increased her diet. Her port sites all look good. Labs are below but these were all within normal parameters. I have personally reviewed the  patients medication history on the [place] controlled substance database.  CBC Latest Ref Rng & Units 09/20/2016 09/18/2016 09/14/2016  WBC 4.0 - 10.5 K/uL 7.0 8.4 13.3(H)  Hemoglobin 12.0 - 15.0 g/dL 11.4(L) 11.8(L) 14.2  Hematocrit 36.0 - 46.0 % 34.6(L) 34.7(L) 41.2  Platelets 150 - 400 K/uL 300 218 201   CMP Latest Ref Rng & Units 09/20/2016 09/18/2016 09/14/2016  Glucose 65 - 99 mg/dL 112(H) 113(H) 108(H)  BUN 6 - 20 mg/dL 7 7 22(H)  Creatinine 0.44 - 1.00 mg/dL 0.78 0.84 1.18(H)  Sodium 135 - 145 mmol/L 140 138 135  Potassium 3.5 - 5.1 mmol/L 3.3(L) 4.1 4.2  Chloride 101 - 111 mmol/L 106 105 98(L)  CO2 22 - 32 mmol/L 28 29 27   Calcium 8.9 - 10.3 mg/dL 9.0 8.9 9.7  Total Protein 6.5 - 8.1 g/dL 6.0(L) - 7.4  Total Bilirubin 0.3 - 1.2 mg/dL 0.4 - 1.3(H)  Alkaline Phos 38 - 126 U/L 50 - 69  AST 15 - 41 U/L 15 - 27  ALT 14 - 54 U/L 16 - 15  PCR for C. difficile colitis was -07/17/17.  Condition ON discharge: Improved  Disposition: Home   Allergies as of 09/20/2016      Reactions   Flexeril [cyclobenzaprine] Other (See Comments)   Not reported reaction  Rash, anxiety   Latex Other (See Comments)   Not reported reaction    Oxycodone Rash   hives   Sulfa Antibiotics Other (See Comments)   Not reported reaction  rash      Medication List    TAKE these medications   acetaminophen 325 MG tablet Commonly known as:  TYLENOL You can take  2 tablets every 4 hours as needed. You cannot take more than 4000 mg of Tylenol(acetaminophen) per day. This is also in your prescribed medication. You need to count each of those as a Tylenol also.   ALIGN PO Take 1 capsule by mouth daily.   aspirin EC 81 MG tablet Take 81 mg by mouth daily.   cetirizine 10 MG tablet Commonly known as:  ZYRTEC Take 10 mg by mouth daily.   co-enzyme Q-10 30 MG capsule Resume this medication next week when you're feeling better. What changed:  how much to take  how to take this  when to take  this  additional instructions   HYDROcodone-acetaminophen 5-325 MG tablet Commonly known as:  NORCO/VICODIN Take 1-2 tablets by mouth every 4 (four) hours as needed for moderate pain.   magnesium oxide 400 MG tablet Commonly known as:  MAG-OX Take 400 mg by mouth daily.   meloxicam 15 MG tablet Commonly known as:  MOBIC Take 15 mg by mouth daily.   Omega 3 1000 MG Caps Resume this medication next week when you're feeling better. What changed:  how much to take  how to take this  when to take this  additional instructions   omeprazole 20 MG capsule Commonly known as:  PRILOSEC Take 20 mg by mouth daily.   PROAIR HFA 108 (90 Base) MCG/ACT inhaler Generic drug:  albuterol Take 1 spray by mouth daily as needed.   SYSTANE 0.4-0.3 % Soln Generic drug:  Polyethyl Glycol-Propyl Glycol Place 1 drop into both eyes daily.   traMADol 50 MG tablet Commonly known as:  ULTRAM Take 50 mg by mouth 3 (three) times daily as needed.   triamcinolone cream 0.1 % Commonly known as:  KENALOG Apply 1 application topically daily as needed.   valsartan 160 MG tablet Commonly known as:  DIOVAN Take 160 mg by mouth daily.   verapamil 120 MG CR tablet Commonly known as:  CALAN-SR Take 120 mg by mouth daily.   Vitamin D3 2000 units Tabs Resume this medication next week when you are feeling better. What changed:  how much to take  how to take this  when to take this  additional instructions      Follow-up [facility] Follow up on 10/08/2016.   Specialty:  General Surgery Why:  Your appointment is at 11 AM. At the office 30 minutes early for check-in. Bring insurance information and photo ID. Contact information: [address] [place] [id number] [phone]           Signed: Earnstine Regal 09/20/2016, 9:57 AM

## 2016-09-18 NOTE — Progress Notes (Signed)
Initial Nutrition Assessment  DOCUMENTATION CODES:   Non-severe (moderate) malnutrition in context of acute illness/injury  INTERVENTION:   D/c Boost Breeze per pt request Encourage PO intake RD will continue to monitor for nutrition needs  NUTRITION DIAGNOSIS:   Inadequate oral intake related to poor appetite, inability to eat as evidenced by per patient/family report.  GOAL:   Patient will meet greater than or equal to 90% of their needs  MONITOR:   PO intake, Labs, Weight trends, I & O's  REASON FOR ASSESSMENT:   Malnutrition Screening Tool    ASSESSMENT:   76 yo WF with 3 day hx of abdominal pain localizing to the RLQ.  Patient has had nausea but no emesis.  Low grade fever.  Mild diarrheal stools.  No prior abdominal surgery.  Presents to ER.  WBC elevated at 13K.  CTA positive for acute appendicitis.  Possible SBO distal jejunum (?).  2/10: s/p Laparoscopic appendectomy  Patient in room with no family at bedside. Pt states she feels sleepy after eating her first solid meal in a week. Pt eating 100% of meals currently. States she is happy to chew her foods again. PTA pt was unable to eat anything without feeling sick for 3-4 days. At this time she was able to drink water. Prior to developing these symptoms she was eating well with good appetite. Pt states her appetite is improving now.  Pt does not like the Boost Breeze supplements, RD will d/c order.  Pt reports UBW of 160 lb. Minimal weight loss recently.  Nutrition-Focused physical exam completed. Findings are mild fat depletion, mild muscle depletion, and no edema.   Labs reviewed. Medications: Protonix tablet daily, Florastor capsule BID, D5 and .45% NaCl w/ KCl infusion at 50 ml/hr -provides 204 kcal  Diet Order:  DIET SOFT Room service appropriate? Yes; Fluid consistency: Thin  Skin:  Reviewed, no issues  Last BM:  2/13  Height:   Ht Readings from Last 1 Encounters:  09/14/16 5\' 2"  (1.575 m)    Weight:    Wt Readings from Last 1 Encounters:  09/14/16 155 lb (70.3 kg)    Ideal Body Weight:  50 kg  BMI:  Body mass index is 28.35 kg/m.  Estimated Nutritional Needs:   Kcal:  1700-1900  Protein:  75-85g  Fluid:  1.9L/day  EDUCATION NEEDS:   No education needs identified at this time  Clayton Bibles, MS, RD, LDN Pager: [phone] After Hours Pager: [phone]

## 2016-09-19 DIAGNOSIS — E440 Moderate protein-calorie malnutrition: Secondary | ICD-10-CM | POA: Insufficient documentation

## 2016-09-19 NOTE — Care Management Important Message (Signed)
Important Message  Patient Details  Name: Jessica Beck MRN: [id number] Date of Birth: 1940-09-13   Medicare Important Message Given:  Yes    Kerin Salen 09/19/2016, 12:29 [place] Message  Patient Details  Name: Jessica Beck MRN: [id number] Date of Birth: [birth year]   Medicare Important Message Given:  Yes    Kerin Salen 09/19/2016, 12:28 PM

## 2016-09-19 NOTE — Progress Notes (Signed)
5 Days Post-Op  Subjective: She felt good yesterday. Really had a good day. She was not hungry so she didn't eat very much and reported some food was too salty to eat. Around 2 AM she started having loose stools which she says are mostly liquid. This morning she feels tired,  very anxious, doesn't feel good at all. Complaining of some pain but this seems to be adequately treated with by mouth pain medicines. She is afraid to eat. She also lives alone and will go home unaccompanied.  Objective: Vital signs in last 24 hours: Temp:  [97.8 F (36.6 C)-98.7 F (37.1 C)] 98.7 F (37.1 C) (02/15 0553) Pulse Rate:  [65-76] 71 (02/15 0553) Resp:  [15-16] 15 (02/15 0553) BP: (126-164)/(73-83) 126/83 (02/15 0553) SpO2:  [95 %-97 %] 95 % (02/15 0553) Last BM Date: 09/19/16 320 PO 1400 IV 2200 urine Afebrile, VSS Lab OK, yesterday Intake/Output from previous day: 02/14 0701 - 02/15 0700 In: [id number] [P.O.:320; I.V.:1300; IV Piggyback:100] Out: 2200 [Urine:2200] Intake/Output this shift: Total I/O In: 0  Out: 600 [Urine:600]  General appearance: alert, cooperative and no distress Resp: clear to auscultation bilaterally GI: Soft, normal postoperative tenderness. Port sites all look good. Positive bowel sounds, recurrent loose stools/diarrhea.  Lab Results:   Recent Labs  09/18/16 0522  WBC 8.4  HGB 11.8*  HCT 34.7*  PLT 218    BMET  Recent Labs  09/18/16 0522  NA 138  K 4.1  CL 105  CO2 29  GLUCOSE 113*  BUN 7  CREATININE 0.84  CALCIUM 8.9   PT/INR No results for input(s): LABPROT, INR in the last 72 hours.   Recent Labs Lab 09/14/16 0956  AST 27  ALT 15  ALKPHOS 69  BILITOT 1.3*  PROT 7.4  ALBUMIN 3.9     Lipase     Component Value Date/Time   LIPASE 16 09/14/2016 0956     Studies/Results: No results found.  Medications: . aspirin EC  81 mg Oral Daily  . enoxaparin (LOVENOX) injection  40 mg Subcutaneous Q24H  . feeding supplement  1 Container Oral  TID BM  . irbesartan  150 mg Oral Daily  . meloxicam  15 mg Oral Daily  . pantoprazole  40 mg Oral Daily  . piperacillin-tazobactam (ZOSYN)  IV  3.375 g Intravenous Q8H  . saccharomyces boulardii  250 mg Oral BID  . verapamil  120 mg Oral Daily    Assessment/Plan Acute appendicitis, gangrenous, with perforation and peritonitis Status post laparoscopic appendectomy 09/14/16, Dr. Armandina Gemma POD #5 Hypertension History of IBS Arthritis - chronic tramadol and Mobic for pain. FEN:IV fluids/Full liquids ID: Zosyn 09/14/16 =>> day 6 DVT: SCD/ Lovenox    Plan: Continue care and treatment. I'm going to stop her antibiotics. I have encouraged her to try taking more by mouth and resume ambulating in the halls. Will recheck her labs in the a.m. and see how she does.    LOS: 4 days    Jessica Beck 09/19/2016 [phone]

## 2016-09-20 LAB — COMPREHENSIVE METABOLIC PANEL
ALT: 16 U/L (ref 14–54)
AST: 15 U/L (ref 15–41)
Albumin: 2.9 g/dL — ABNORMAL LOW (ref 3.5–5.0)
Alkaline Phosphatase: 50 U/L (ref 38–126)
Anion gap: 6 (ref 5–15)
BILIRUBIN TOTAL: 0.4 mg/dL (ref 0.3–1.2)
BUN: 7 mg/dL (ref 6–20)
CHLORIDE: 106 mmol/L (ref 101–111)
CO2: 28 mmol/L (ref 22–32)
Calcium: 9 mg/dL (ref 8.9–10.3)
Creatinine, Ser: 0.78 mg/dL (ref 0.44–1.00)
Glucose, Bld: 112 mg/dL — ABNORMAL HIGH (ref 65–99)
POTASSIUM: 3.3 mmol/L — AB (ref 3.5–5.1)
Sodium: 140 mmol/L (ref 135–145)
TOTAL PROTEIN: 6 g/dL — AB (ref 6.5–8.1)

## 2016-09-20 LAB — CBC
HEMATOCRIT: 34.6 % — AB (ref 36.0–46.0)
Hemoglobin: 11.4 g/dL — ABNORMAL LOW (ref 12.0–15.0)
MCH: 28.9 pg (ref 26.0–34.0)
MCHC: 32.9 g/dL (ref 30.0–36.0)
MCV: 87.6 fL (ref 78.0–100.0)
PLATELETS: 300 10*3/uL (ref 150–400)
RBC: 3.95 MIL/uL (ref 3.87–5.11)
RDW: 13 % (ref 11.5–15.5)
WBC: 7 10*3/uL (ref 4.0–10.5)

## 2016-09-20 MED ORDER — OMEGA 3 1000 MG PO CAPS: ORAL_CAPSULE | ORAL | Status: DC

## 2016-09-20 MED ORDER — COENZYME Q10 30 MG PO CAPS: ORAL_CAPSULE | ORAL | Status: DC

## 2016-09-20 MED ORDER — ACETAMINOPHEN 325 MG PO TABS: ORAL_TABLET | ORAL | Status: DC

## 2016-09-20 MED ORDER — VITAMIN D3 50 MCG (2000 UT) PO TABS: ORAL_TABLET | ORAL | Status: DC

## 2016-09-20 MED ORDER — POTASSIUM CHLORIDE CRYS ER 20 MEQ PO TBCR
20.0000 meq | EXTENDED_RELEASE_TABLET | Freq: Three times a day (TID) | ORAL | Status: DC
  Administered 2016-09-20: 20 meq via ORAL
  Filled 2016-09-20: qty 1

## 2016-09-20 MED ORDER — HYDROCODONE-ACETAMINOPHEN 5-325 MG PO TABS: 1.0000 | ORAL_TABLET | ORAL | 0 refills | Status: DC | PRN

## 2016-09-20 NOTE — Progress Notes (Signed)
6 Days Post-Op  Subjective: She is doing much better this a.m. She tolerated breakfast, and ate about 50%. Diarrhea has resolved, stool is becoming more formed and still soft. Her incisions are all healing nicely. She feels like she is mobilizing without any significant issues. Objective: Vital signs in last 24 hours: Temp:  [98.1 F (36.7 C)-98.3 F (36.8 C)] 98.1 F (36.7 C) (02/16 0630) Pulse Rate:  [67-72] 68 (02/16 0630) Resp:  [15-18] 18 (02/16 0630) BP: (142-170)/(74-85) 161/84 (02/16 0630) SpO2:  [96 %-98 %] 96 % (02/16 0630) Last BM Date: 09/19/16 370 yesterday, 240 this AM, PO 1200 IV 3100 urine  Afebrile, VSS BP up some K+ 3.3 CBC stable, WBC 7.0  Intake/Output from previous day: 02/15 0701 - 02/16 0700 In: [id number] [P.O.:370; I.V.:1200] Out: 3100 [Urine:3100] Intake/Output this shift: No intake/output data recorded.  General appearance: alert, cooperative and no distress Resp: clear to auscultation bilaterally GI: Soft, sore, incisions/port sites are all healing nicely. Positive bowel sounds and positive BM.  Lab Results:   Recent Labs  09/18/16 0522 09/20/16 0550  WBC 8.4 7.0  HGB 11.8* 11.4*  HCT 34.7* 34.6*  PLT 218 300    BMET  Recent Labs  09/18/16 0522 09/20/16 0550  NA 138 140  K 4.1 3.3*  CL 105 106  CO2 29 28  GLUCOSE 113* 112*  BUN 7 7  CREATININE 0.84 0.78  CALCIUM 8.9 9.0   PT/INR No results for input(s): LABPROT, INR in the last 72 hours.   Recent Labs Lab 09/14/16 0956 09/20/16 0550  AST 27 15  ALT 15 16  ALKPHOS 69 50  BILITOT 1.3* 0.4  PROT 7.4 6.0*  ALBUMIN 3.9 2.9*     Lipase     Component Value Date/Time   LIPASE 16 09/14/2016 0956     Studies/Results: No results found.  Medications: . aspirin EC  81 mg Oral Daily  . enoxaparin (LOVENOX) injection  40 mg Subcutaneous Q24H  . feeding supplement  1 Container Oral TID BM  . irbesartan  150 mg Oral Daily  . meloxicam  15 mg Oral Daily  . pantoprazole  40  mg Oral Daily  . potassium chloride  20 mEq Oral TID PC  . saccharomyces boulardii  250 mg Oral BID  . verapamil  120 mg Oral Daily    Assessment/Plan Acute appendicitis, gangrenous, with perforation and peritonitis Status post laparoscopic appendectomy 09/14/16, Dr. Armandina Gemma POD #6 Hypertension History of IBS Arthritis - chronic tramadol and Mobic for pain. FEN:IV fluids/Full liquids ID: Zosyn 09/14/16 =>>completed on 2/15 - day 5 of Zosyn DVT: SCD/Lovenox    Plan: Home today.       LOS: 5 days    Antonetta Clanton 09/20/2016 [phone]

## 2016-09-20 NOTE — Discharge Instructions (Signed)
Laparoscopic Appendectomy, Adult, Care After °Refer to this sheet in the next few weeks. These instructions provide you with information about caring for yourself after your procedure. Your health care provider may also give you more specific instructions. Your treatment has been planned according to current medical practices, but problems sometimes occur. Call your health care provider if you have any problems or questions after your procedure. °What can I expect after the procedure? °After the procedure, it is common to have: °· A decrease in your energy level. °· Mild pain in the area where the surgical cuts (incisions) were made. °· Constipation. This can be caused by pain medicine and a decrease in your activity. ° °Follow these instructions at home: °Medicines °· Take over-the-counter and prescription medicines only as told by your health care provider. °· Do not drive for 24 hours if you received a sedative. °· Do not drive or operate heavy machinery while taking prescription pain medicine. °· If you were prescribed an antibiotic medicine, take it as told by your health care provider. Do not stop taking the antibiotic even if you start to feel better. °Activity °· For 3 weeks or as long as told by your health care provider: °? Do not lift anything that is heavier than 10 pounds (4.5 kg). °? Do not play contact sports. °· Gradually return to your normal activities. Ask your health care provider what activities are safe for you. °Bathing °· Keep your incisions clean and dry. Clean them as often as told by your health care provider: °? Gently wash the incisions with soap and water. °? Rinse the incisions with water to remove all soap. °? Pat the incisions dry with a clean towel. Do not rub the incisions. °· You may take showers after 48 hours. °· Do not take baths, swim, or use hot tubs for 2 weeks or as told by your health care provider. °Incision care °· Follow instructions from your healthcare provider about  how to take care of your incisions. Make sure you: °? Wash your hands with soap and water before you change your bandage (dressing). If soap and water are not available, use hand sanitizer. °? Change your dressing as told by your health care provider. °? Leave stitches (sutures), skin glue, or adhesive strips in place. These skin closures may need to stay in place for 2 weeks or longer. If adhesive strip edges start to loosen and curl up, you may trim the loose edges. Do not remove adhesive strips completely unless your health care provider tells you to do that. °· Check your incision areas every day for signs of infection. Check for: °? More redness, swelling, or pain. °? More fluid or blood. °? Warmth. °? Pus or a bad smell. °Other Instructions °· If you were sent home with a drain, follow instructions from your health care provider about how to care for the drain and how to empty it. °· Take deep breaths. This helps to prevent your lungs from becoming inflamed. °· To relieve and prevent constipation: °? Drink plenty of fluids. °? Eat plenty of fruits and vegetables. °· Keep all follow-up visits as told by your health care provider. This is important. °Contact a health care provider if: °· You have more redness, swelling, or pain around an incision. °· You have more fluid or blood coming from an incision. °· Your incision feels warm to the touch. °· You have pus or a bad smell coming from an incision or dressing. °· Your incision   edges break open after your sutures have been removed. °· You have increasing pain in your shoulders. °· You feel dizzy or you faint. °· You develop shortness of breath. °· You keep feeling nauseous or vomiting. °· You have diarrhea or you cannot control your bowel functions. °· You lose your appetite. °· You develop swelling or pain in your legs. °Get help right away if: °· You have a fever. °· You develop a rash. °· You have difficulty breathing. °· You have sharp pains in your  chest. °This information is not intended to replace advice given to you by your health care provider. Make sure you discuss any questions you have with your health care provider. °Document Released: 07/22/2005 Document Revised: 12/22/2015 Document Reviewed: 01/09/2015 °Elsevier Interactive Patient Education © 2017 [employer] ° °CCS ______[facility] °LAPAROSCOPIC SURGERY: POST OP INSTRUCTIONS °Always review your discharge instruction sheet given to you by the facility where your surgery was performed. °IF YOU HAVE DISABILITY OR FAMILY LEAVE FORMS, YOU MUST BRING THEM TO THE OFFICE FOR PROCESSING.   °DO NOT GIVE THEM TO YOUR DOCTOR. ° °1. A prescription for pain medication may be given to you upon discharge.  Take your pain medication as prescribed, if needed.  If narcotic pain medicine is not needed, then you may take acetaminophen (Tylenol) or ibuprofen (Advil) as needed. °2. Take your usually prescribed medications unless otherwise directed. °3. If you need a refill on your pain medication, please contact your pharmacy.  They will contact our office to request authorization. Prescriptions will not be filled after 5pm or on week-ends. °4. You should follow a light diet the first few days after arrival home, such as soup and crackers, etc.  Be sure to include lots of fluids daily. °5. Most patients will experience some swelling and bruising in the area of the incisions.  Ice packs will help.  Swelling and bruising can take several days to resolve.  °6. It is common to experience some constipation if taking pain medication after surgery.  Increasing fluid intake and taking a stool softener (such as Colace) will usually help or prevent this problem from occurring.  A mild laxative (Milk of Magnesia or Miralax) should be taken according to package instructions if there are no bowel movements after 48 hours. °7. Unless discharge instructions indicate otherwise, you may remove your bandages 24-48  hours after surgery, and you may shower at that time.  You may have steri-strips (small skin tapes) in place directly over the incision.  These strips should be left on the skin for 7-10 days.  If your surgeon used skin glue on the incision, you may shower in 24 hours.  The glue will flake off over the next 2-3 weeks.  Any sutures or staples will be removed at the office during your follow-up visit. °8. ACTIVITIES:  You may resume regular (light) daily activities beginning the next day--such as daily self-care, walking, climbing stairs--gradually increasing activities as tolerated.  You may have sexual intercourse when it is comfortable.  Refrain from any heavy lifting or straining until approved by your doctor. °a. You may drive when you are no longer taking prescription pain medication, you can comfortably wear a seatbelt, and you can safely maneuver your car and apply brakes. °b. RETURN TO WORK:  __________________________________________________________ °9. You should see your doctor in the office for a follow-up appointment approximately 2-3 weeks after your surgery.  Make sure that you call for this appointment within a day or   two after you arrive home to insure a convenient appointment time. °10. OTHER INSTRUCTIONS: __________________________________________________________________________________________________________________________ __________________________________________________________________________________________________________________________ °WHEN TO CALL YOUR DOCTOR: °1. Fever over 101.0 °2. Inability to urinate °3. Continued bleeding from incision. °4. Increased pain, redness, or drainage from the incision. °5. Increasing abdominal pain ° °The clinic staff is available to answer your questions during regular business hours.  Please don’t hesitate to call and ask to speak to one of the nurses for clinical concerns.  If you have a medical emergency, go to the nearest emergency room or call  911.  A surgeon from [facility] is always on call at the hospital. °[address], [address], [place], [place]  [address] ? [address] [address], [place], [place]   [address] °[phone] ? [phone] ? FAX [phone] °Web site: [email or web address] ° °

## 2016-09-27 ENCOUNTER — Ambulatory Visit
Admission: RE | Admit: 2016-09-27 | Discharge: 2016-09-27 | Disposition: A | Discharge status: Home / Self Care | Payer: Medicare Other | Source: Ambulatory Visit | Attending: Surgery | Admitting: Surgery

## 2016-09-27 ENCOUNTER — Other Ambulatory Visit: Payer: Self-pay | Admitting: Surgery

## 2016-09-27 DIAGNOSIS — Z9049 Acquired absence of other specified parts of digestive tract: Secondary | ICD-10-CM

## 2016-09-27 DIAGNOSIS — R509 Fever, unspecified: Secondary | ICD-10-CM | POA: Diagnosis not present

## 2016-09-27 MED ORDER — IOPAMIDOL (ISOVUE-300) INJECTION 61%: 100.0000 mL | Freq: Once | INTRAVENOUS | Status: DC | PRN

## 2016-10-04 DIAGNOSIS — T819XXA Unspecified complication of procedure, initial encounter: Secondary | ICD-10-CM | POA: Diagnosis not present

## 2016-11-13 DIAGNOSIS — H25013 Cortical age-related cataract, bilateral: Secondary | ICD-10-CM | POA: Diagnosis not present

## 2016-11-13 DIAGNOSIS — H2513 Age-related nuclear cataract, bilateral: Secondary | ICD-10-CM | POA: Diagnosis not present

## 2016-11-13 DIAGNOSIS — H40013 Open angle with borderline findings, low risk, bilateral: Secondary | ICD-10-CM | POA: Diagnosis not present

## 2016-11-13 DIAGNOSIS — H35033 Hypertensive retinopathy, bilateral: Secondary | ICD-10-CM | POA: Diagnosis not present

## 2016-11-26 DIAGNOSIS — Z1231 Encounter for screening mammogram for malignant neoplasm of breast: Secondary | ICD-10-CM | POA: Diagnosis not present

## 2017-02-24 DIAGNOSIS — M2041 Other hammer toe(s) (acquired), right foot: Secondary | ICD-10-CM | POA: Diagnosis not present

## 2017-02-25 DIAGNOSIS — M2041 Other hammer toe(s) (acquired), right foot: Secondary | ICD-10-CM | POA: Diagnosis not present

## 2017-03-07 ENCOUNTER — Encounter: Payer: Self-pay | Admitting: Family Medicine

## 2017-03-07 ENCOUNTER — Ambulatory Visit (INDEPENDENT_AMBULATORY_CARE_PROVIDER_SITE_OTHER): Payer: Medicare Other | Admitting: Family Medicine

## 2017-03-07 VITALS — BP 160/80 | HR 86 | Temp 98.2°F | Ht 62.0 in | Wt 151.0 lb

## 2017-03-07 DIAGNOSIS — M150 Primary generalized (osteo)arthritis: Secondary | ICD-10-CM | POA: Diagnosis not present

## 2017-03-07 DIAGNOSIS — G43709 Chronic migraine without aura, not intractable, without status migrainosus: Secondary | ICD-10-CM

## 2017-03-07 DIAGNOSIS — K582 Mixed irritable bowel syndrome: Secondary | ICD-10-CM

## 2017-03-07 DIAGNOSIS — I10 Essential (primary) hypertension: Secondary | ICD-10-CM | POA: Insufficient documentation

## 2017-03-07 DIAGNOSIS — G43909 Migraine, unspecified, not intractable, without status migrainosus: Secondary | ICD-10-CM

## 2017-03-07 DIAGNOSIS — M159 Polyosteoarthritis, unspecified: Secondary | ICD-10-CM

## 2017-03-07 DIAGNOSIS — M1990 Unspecified osteoarthritis, unspecified site: Secondary | ICD-10-CM | POA: Insufficient documentation

## 2017-03-07 DIAGNOSIS — E782 Mixed hyperlipidemia: Secondary | ICD-10-CM | POA: Diagnosis not present

## 2017-03-07 DIAGNOSIS — K589 Irritable bowel syndrome without diarrhea: Secondary | ICD-10-CM | POA: Insufficient documentation

## 2017-03-07 HISTORY — DX: Migraine, unspecified, not intractable, without status migrainosus: G43.909

## 2017-03-07 NOTE — Progress Notes (Addendum)
    Subjective:    Patient ID: Jessica Beck, female    DOB: 1940-08-12, 76 y.o.   MRN: [id number]   CC: est care, HTN  HTN- was on valsartan which was recalled. Transitioned to a different ARB.   PMH- HTN, asthma, migraines, IBS, cataracts bilaterally Meds- ARB, CCB, potassium OTC- multivitamin, fish oil, coq10, aspirin, b12, cholestoff plus (could not tolerate statin) Allergies- flexeril, hydrocodone, sulfa drugs, latex  Surg Hx- appendix, right hip replaced, knees replaced bilaterally FH- father CHF, mother passed away in her 69's, sister skin cancer SH- former smoker, alcohol 2-3 times a week mostly wine, no drug use. Live alone, has pet cats/dogs.  Smoking status reviewed- former smoker  Health maintenance- has had 2 in the past that were normal Has had DEXA that was normal Believes she has had pneumonia vaccine   Review of Systems- Positive for DOE Negative CP, no unexplained weight loss, no fevers, chills  Objective:  BP (!) 160/80   Pulse 86   Temp 98.2 F (36.8 C) (Oral)   Ht 5\' 2"  (1.575 m)   Wt 151 lb (68.5 kg)   SpO2 96%   BMI 27.62 kg/m  Vitals and nursing note reviewed  General: well nourished, in no acute distress HEENT: normocephalic, PERRL, EOMI. No scleral icterus or conjunctival pallor, no nasal discharge, moist mucous membranes, good dentition without erythema or discharge noted in posterior oropharynx Neck: supple, non-tender, without lymphadenopathy Cardiac: RRR, clear S1 and S2, no murmurs, rubs, or gallops Respiratory: clear to auscultation bilaterally, no increased work of breathing Abdomen: soft, nontender, nondistended, no masses or organomegaly. Bowel sounds present. 3 well healed scars from prior abdominal surgery present. Extremities: no edema or cyanosis Skin: small, sandpaper rough erythematous papule on hairline. No other rashes or lesions noted. Neuro: alert and oriented, no focal deficits  Assessment & Plan:    HTN  (hypertension)  Chronic. Not at goal of <130/90, recently switched to 40 mg telmisartan, also on 120mg  verapamil.   -check CMP today -will call patient with results, likely will increase telmisartan dose to 80 mg over the phone at that time  Mixed hyperlipidemia  Per chart review could not tolerate statin. Taking various supplements and "cholest-off". Last lipid panel in 11/2015  -fasting lipid panel today -will follow up with pt regarding results via phone   Return in about 3 months (around 06/07/2017), or as needed.   Lucila Maine, DO Family Medicine Resident PGY-2

## 2017-03-07 NOTE — Patient Instructions (Signed)
  It was great seeing you today!  I will call you with your lab results.   Please see your dermatologist for the spot on your forehead.   If you have questions or concerns please do not hesitate to call at [phone].  Lucila Maine, DO PGY-2, [facility] 03/07/2017 11:07 AM

## 2017-03-07 NOTE — Assessment & Plan Note (Signed)
  Per chart review could not tolerate statin. Taking various supplements and "cholest-off". Last lipid panel in 11/2015  -fasting lipid panel today -will follow up with pt regarding results via phone

## 2017-03-07 NOTE — Assessment & Plan Note (Addendum)
  Chronic. Not at goal of <130/90, recently switched to 40 mg telmisartan, also on 120mg  verapamil.   -check CMP today -will call patient with results, likely will increase telmisartan dose to 80 mg over the phone at that time

## 2017-03-08 LAB — CMP14+EGFR
ALBUMIN: 4.6 g/dL (ref 3.5–4.8)
ALK PHOS: 81 IU/L (ref 39–117)
ALT: 12 IU/L (ref 0–32)
AST: 25 IU/L (ref 0–40)
Albumin/Globulin Ratio: 2 (ref 1.2–2.2)
BILIRUBIN TOTAL: 0.9 mg/dL (ref 0.0–1.2)
BUN / CREAT RATIO: 18 (ref 12–28)
BUN: 18 mg/dL (ref 8–27)
CHLORIDE: 101 mmol/L (ref 96–106)
CO2: 26 mmol/L (ref 20–29)
CREATININE: 1.01 mg/dL — AB (ref 0.57–1.00)
Calcium: 10 mg/dL (ref 8.7–10.3)
GFR calc Af Amer: 63 mL/min/{1.73_m2} (ref >59)
GFR calc non Af Amer: 55 mL/min/{1.73_m2} — ABNORMAL LOW (ref >59)
GLUCOSE: 92 mg/dL (ref 65–99)
Globulin, Total: 2.3 g/dL (ref 1.5–4.5)
Potassium: 4.3 mmol/L (ref 3.5–5.2)
Sodium: 142 mmol/L (ref 134–144)
Total Protein: 6.9 g/dL (ref 6.0–8.5)

## 2017-03-08 LAB — LIPID PANEL
CHOL/HDL RATIO: 2.7 ratio (ref 0.0–4.4)
Cholesterol, Total: 210 mg/dL — ABNORMAL HIGH (ref 100–199)
HDL: 77 mg/dL (ref >39)
LDL Calculated: 110 mg/dL — ABNORMAL HIGH (ref 0–99)
Triglycerides: 117 mg/dL (ref 0–149)
VLDL CHOLESTEROL CAL: 23 mg/dL (ref 5–40)

## 2017-03-10 ENCOUNTER — Telehealth: Payer: Self-pay | Admitting: Family Medicine

## 2017-03-10 NOTE — Telephone Encounter (Signed)
Asked Ms. Clinch to call the clinic back to discuss lab results. Would recommend she take double her verapamil dose (take 2 pills daily) and come back to be seen in 1-2 weeks for BP check and repeat BMP at that time. Also would like to discuss her history of tried statins as her ASCVD risk is 30% and she would benefit from being on a statin. Will try to call her again to speak with her.   Lucila Maine, DO PGY-2, [facility] 03/10/2017 1:47 PM

## 2017-03-11 NOTE — Telephone Encounter (Signed)
Patient is aware of labs and also medication changes that provider would like for her to do.  Patient is confused as to why she needs to double up on her BP medication that she has been on for a while vs the newest BP she was given.  Patient also declines wanting to start a statin.  Would like to discuss this more with provider. Yerick Eggebrecht,CMA

## 2017-03-14 ENCOUNTER — Telehealth: Payer: Self-pay | Admitting: Family Medicine

## 2017-03-14 NOTE — Telephone Encounter (Signed)
Called Ms. Jessica Beck to discuss lab results and prior trials with cholesterol medications. Asked her to increase water, take double of her telmisartan (NOT verapimil) and follow up in 2 weeks to recheck BMP. Additionally, she has tried crestor before and was "debilitated" by it. Had to discontinue this. She is taking a daily aspirin. We will discuss this further at her follow up visit.  Lucila Maine, DO PGY-2, [facility] 03/14/2017 3:03 PM

## 2017-03-21 NOTE — Telephone Encounter (Signed)
Pt wants to know if she needs to be fasting for her lab draw.  If she does, she would like to come one morning before her appt.  Please place future orders in epic  Jessica Beck, Jessica Beck, Jessica Beck

## 2017-03-21 NOTE — Telephone Encounter (Signed)
No need for fasting. Thank you.

## 2017-03-24 ENCOUNTER — Encounter: Payer: Self-pay | Admitting: *Deleted

## 2017-03-24 NOTE — Telephone Encounter (Signed)
This encounter was created in error - please disregard.

## 2017-03-24 NOTE — Telephone Encounter (Signed)
Pt contacted and informed of no need for fasting before apt with pcp. Pt voiced understanding.

## 2017-03-26 ENCOUNTER — Ambulatory Visit (INDEPENDENT_AMBULATORY_CARE_PROVIDER_SITE_OTHER): Payer: Medicare Other | Admitting: Family Medicine

## 2017-03-26 ENCOUNTER — Encounter: Payer: Self-pay | Admitting: Family Medicine

## 2017-03-26 VITALS — BP 140/68 | HR 87 | Ht 62.0 in | Wt 153.2 lb

## 2017-03-26 DIAGNOSIS — E782 Mixed hyperlipidemia: Secondary | ICD-10-CM

## 2017-03-26 DIAGNOSIS — I10 Essential (primary) hypertension: Secondary | ICD-10-CM | POA: Diagnosis not present

## 2017-03-26 NOTE — Patient Instructions (Signed)
  It was good seeing you today!  I'll call you with your lab results and we'll send you a copy.  Lucila Maine, DO PGY-2, [facility] 03/26/2017 2:24 PM

## 2017-03-26 NOTE — Progress Notes (Signed)
    Subjective:    Patient ID: Jessica Beck, female    DOB: 06/25/1941, 76 y.o.   MRN: [id number]  CC: follow up HTN, HLD  HTN -has been taking verapimil and double telmisartan (80mg ) -doing well on increased dose -the CCB was originally prescribed to try to help with migraines however patient still gets migraines frequently, so she is open to coming off verapimil if a change needs to be made to BP meds -she had cough with ACE and thinks she had an adverse reaction to Norvasc -she has been on HCTZ in past, unsure if she had reaction or why it was changed to something else -no CP, SOB  HLD -takes supplements, no statin has not tolerated crestor in past -per patient, was on 5 mg crestor for 1 month and had debilitating muscle aches and joint pains -she does not take red yeast rice due to risk for side effect for myalgias -is aware her ASCVD risk is 25%. With statin risk reduced to 16%. Patient does not think that this reduction is worth the possible side effects of a statin -she is very active -mother had high cholesterol and died at age 71 "of old age"- no clear cause  Smoking status reviewed- former smoker quit in 1975  Review of Systems- see HPI   Objective:  BP 140/68   Pulse 87   Ht 5\' 2"  (1.575 m)   Wt 153 lb 3.2 oz (69.5 kg)   SpO2 95%   BMI 28.02 kg/m  Vitals and nursing note reviewed  General: well nourished, in no acute distress Cardiac: RRR, clear S1 and S2, no murmurs, rubs, or gallops Respiratory: clear to auscultation bilaterally, no increased work of breathing Extremities: no edema or cyanosis. Warm, well perfused. Skin: warm and dry, no rashes noted Neuro: alert and oriented, no focal deficits  Assessment & Plan:    HTN (hypertension)  Chronic, goal by new guidelines would be <130/80 so patient not currently at goal. By old guidelines of <140/90 patient controlled.   -continue telmisartan 80 mg and verapimil 120 mg -check BMP today to assess  kidney function -will continue to discuss merits of changing to different medication, if Cr elevated would discontinue ARB and possibly switch to thiazide -will follow up with patient via phone  Mixed hyperlipidemia  ASCVD 25%, reviewed with patient. With addition of statin plus continued aspirin therapy risk is reduced to 16 percent. Long discussion of risk/benefits of adding statin. Offered trying Atorvastatin 40 mg once weekly. Patient prefers to not add statin back due to severe side effects in past.  -plan to recheck lipid panel in 1 year and revisit conversation of adding statin in future -patient verbalized risks of MI or CVA and preference is to not add statin at this time    Return in about 6 months (around 09/26/2017).   Lucila Maine, DO Family Medicine Resident PGY-2

## 2017-03-26 NOTE — Assessment & Plan Note (Signed)
  Chronic, goal by new guidelines would be <130/80 so patient not currently at goal. By old guidelines of <140/90 patient controlled.   -continue telmisartan 80 mg and verapimil 120 mg -check BMP today to assess kidney function -will continue to discuss merits of changing to different medication, if Cr elevated would discontinue ARB and possibly switch to thiazide -will follow up with patient via phone

## 2017-03-26 NOTE — Assessment & Plan Note (Signed)
  ASCVD 25%, reviewed with patient. With addition of statin plus continued aspirin therapy risk is reduced to 16 percent. Long discussion of risk/benefits of adding statin. Offered trying Atorvastatin 40 mg once weekly. Patient prefers to not add statin back due to severe side effects in past.  -plan to recheck lipid panel in 1 year and revisit conversation of adding statin in future -patient verbalized risks of MI or CVA and preference is to not add statin at this time

## 2017-03-27 LAB — BASIC METABOLIC PANEL
BUN / CREAT RATIO: 20 (ref 12–28)
BUN: 16 mg/dL (ref 8–27)
CALCIUM: 9.9 mg/dL (ref 8.7–10.3)
CHLORIDE: 103 mmol/L (ref 96–106)
CO2: 24 mmol/L (ref 20–29)
Creatinine, Ser: 0.82 mg/dL (ref 0.57–1.00)
GFR calc Af Amer: 81 mL/min/{1.73_m2} (ref >59)
GFR calc non Af Amer: 70 mL/min/{1.73_m2} (ref >59)
GLUCOSE: 92 mg/dL (ref 65–99)
POTASSIUM: 4.1 mmol/L (ref 3.5–5.2)
Sodium: 144 mmol/L (ref 134–144)

## 2017-03-28 ENCOUNTER — Encounter: Payer: Self-pay | Admitting: *Deleted

## 2017-03-28 ENCOUNTER — Other Ambulatory Visit: Payer: Self-pay | Admitting: *Deleted

## 2017-03-28 MED ORDER — TELMISARTAN 80 MG PO TABS: 80.0000 mg | ORAL_TABLET | Freq: Every day | ORAL | 0 refills | Status: DC

## 2017-03-28 NOTE — Telephone Encounter (Signed)
Patient calling to request refill of:  Name of Medication(s):  micardis 80mg  Last date of OV:  03/26/17 Pharmacy:  [facility]  Patient was instructed to take 80mg  of her micardis and is now out of her medication.  She will need this refilled so she can continue on the dose.  Will forward to MD.  Andreas Newport

## 2017-03-28 NOTE — Telephone Encounter (Signed)
LM for patient to call back if there were questions about her normal labs.  Letter printed and mailed. Jazmin Hartsell,CMA

## 2017-03-28 NOTE — Addendum Note (Signed)
Addended by: Valerie Roys on: 03/28/2017 12:51 PM   Modules accepted: Orders

## 2017-03-28 NOTE — Telephone Encounter (Signed)
-----   Message from Steve Rattler, DO sent at 03/27/2017  9:05 PM EDT ----- Please call Ms. Breuer and let her know her labs were completely normal, kidney function was normal at 0.82. She would like a copy of her lab results as well. No changes to medications. I can speak with her if she likes but won't be back on day schedule until Tuesday. Thanks!

## 2017-05-22 ENCOUNTER — Ambulatory Visit (INDEPENDENT_AMBULATORY_CARE_PROVIDER_SITE_OTHER): Payer: Medicare Other | Admitting: *Deleted

## 2017-05-22 ENCOUNTER — Other Ambulatory Visit: Payer: Self-pay | Admitting: *Deleted

## 2017-05-22 DIAGNOSIS — Z23 Encounter for immunization: Secondary | ICD-10-CM

## 2017-06-09 ENCOUNTER — Other Ambulatory Visit: Payer: Self-pay | Admitting: *Deleted

## 2017-06-09 MED ORDER — TELMISARTAN 80 MG PO TABS: 80.0000 mg | ORAL_TABLET | Freq: Every day | ORAL | 3 refills | Status: DC

## 2017-06-09 NOTE — Telephone Encounter (Signed)
Pharmacy calling requesting refill on micardis for patient. Requesting 90-day supply

## 2017-06-12 NOTE — Telephone Encounter (Signed)
Patient left message on nurse line requesting refill on Tramadol. Attempted to reach patient to explain that PCP needs her to make OV to discuss. Message left on VM to return call to shed appt. Hubbard Hartshorn, RN, BSN

## 2017-06-13 ENCOUNTER — Telehealth: Payer: Self-pay | Admitting: *Deleted

## 2017-06-13 NOTE — Telephone Encounter (Signed)
Patient left message on nurse line stating she was returning [facility] call.

## 2017-06-20 ENCOUNTER — Encounter: Payer: Self-pay | Admitting: Family Medicine

## 2017-06-20 ENCOUNTER — Ambulatory Visit (INDEPENDENT_AMBULATORY_CARE_PROVIDER_SITE_OTHER): Payer: Medicare Other | Admitting: Family Medicine

## 2017-06-20 ENCOUNTER — Other Ambulatory Visit: Payer: Self-pay

## 2017-06-20 VITALS — BP 130/82 | HR 82 | Temp 98.2°F | Ht 62.5 in | Wt 156.0 lb

## 2017-06-20 DIAGNOSIS — M150 Primary generalized (osteo)arthritis: Secondary | ICD-10-CM | POA: Diagnosis present

## 2017-06-20 DIAGNOSIS — I10 Essential (primary) hypertension: Secondary | ICD-10-CM | POA: Diagnosis not present

## 2017-06-20 DIAGNOSIS — M205X2 Other deformities of toe(s) (acquired), left foot: Secondary | ICD-10-CM

## 2017-06-20 DIAGNOSIS — M2042 Other hammer toe(s) (acquired), left foot: Secondary | ICD-10-CM | POA: Insufficient documentation

## 2017-06-20 DIAGNOSIS — M159 Polyosteoarthritis, unspecified: Secondary | ICD-10-CM

## 2017-06-20 MED ORDER — TRIAMCINOLONE ACETONIDE 0.1 % EX CREA: 1.0000 "application " | TOPICAL_CREAM | Freq: Every day | CUTANEOUS | 2 refills | Status: DC | PRN

## 2017-06-20 MED ORDER — TRAMADOL HCL 50 MG PO TABS: 50.0000 mg | ORAL_TABLET | Freq: Three times a day (TID) | ORAL | 2 refills | Status: DC | PRN

## 2017-06-20 NOTE — Progress Notes (Signed)
    Subjective:    Patient ID: Jessica Beck, female    DOB: Oct 07, 1940, 76 y.o.   MRN: [id number]   CC: needs refills  Hand pain/trigger finger/arthritis- has had hand pain for a long time and trigger finger in left fourth finger, recently developed in right fourth finger as well- this is very annoying to her. Her arthritis it appears has worsened over past few months and her hands feel "swollen" even though they dont look it. It' sometimes hard to grip things. She has nodules at the tips of her fingers and can't bend these areas. She takes tramadol for arthritis pain and needs refill. She has seen ortho in past and gotten injections in one trigger finger but wants injection in the other. She denies rash, muscle weakness, numbness/tingling in arms, no neck pain.   Mallet toe- endorses "trigger finger" of left 2nd toe. She spoke with ortho about this and they gave her a hard splint to wear which was very uncomfortable. She now only wears soft silicone sleeve on the toe which seems to help. She had orthotics in past but none currently.   HTN- doing well taking BP medications. No chest pain, SOB, DOE, vision changes  Smoking status reviewed- former smoker  Review of Systems- see HPI   Objective:  BP 130/82   Pulse 82   Temp 98.2 F (36.8 C) (Oral)   Ht 5' 2.5" (1.588 m)   Wt 156 lb (70.8 kg)   SpO2 95%   BMI 28.08 kg/m  Vitals and nursing note reviewed  General: well nourished, in no acute distress Cardiac: RRR, clear S1 and S2, no murmurs, rubs, or gallops Respiratory: clear to auscultation bilaterally, no increased work of breathing Extremities: no edema or cyanosis. Hands are not swollen, heberden's nodes present, decreased grips strength bilaterally. Left second toe with arching compared to others. Neurovascularly in tact distally Skin: warm and dry, no rashes noted Neuro: alert and oriented, no focal deficits   Assessment & Plan:    Mallet toe of left foot  Patient  does not want surgery on this, would consider injection. Advised toe strap.   -referral made to sports med per pt preference  Osteoarthritis  Chronic, patient is physically active which helps. Pain controlled w/ mobic and tramadol  -tramadol refilled -referral to sports med made, she has had knee/shoulder injections in past w/ some relief  HTN (hypertension)  Chronic, stable, at goal today of <140/90  -continue current regimen -follow up 3 months    Return in about 3 months (around 09/20/2017).   Lucila Maine, DO Family Medicine Resident PGY-2

## 2017-06-20 NOTE — Assessment & Plan Note (Signed)
  Chronic, stable, at goal today of <140/90  -continue current regimen -follow up 3 months

## 2017-06-20 NOTE — Patient Instructions (Addendum)
It was great seeing you today!  I have put in a referral for sports medicine, you will be called to schedule this appointment.   I'd like to see you back in about 3 months. Enjoy the holidays!  If you have questions or concerns please do not hesitate to call at [phone].  Lucila Maine, DO PGY-2, [facility] 06/20/2017 2:50 PM    Osteoarthritis Osteoarthritis is a type of arthritis that affects tissue that covers the ends of bones in joints (cartilage). Cartilage acts as a cushion between the bones and helps them move smoothly. Osteoarthritis results when cartilage in the joints gets worn down. Osteoarthritis is sometimes called "wear and tear" arthritis. Osteoarthritis is the most common form of arthritis. It often occurs in older people. It is a condition that gets worse over time (a progressive condition). Joints that are most often affected by this condition are in:  Fingers.  Toes.  Hips.  Knees.  Spine, including neck and lower back.  What are the causes? This condition is caused by age-related wearing down of cartilage that covers the ends of bones. What increases the risk? The following factors may make you more likely to develop this condition:  Older age.  Being overweight or obese.  Overuse of joints, such as in athletes.  Past injury of a joint.  Past surgery on a joint.  Family history of osteoarthritis.  What are the signs or symptoms? The main symptoms of this condition are pain, swelling, and stiffness in the joint. The joint may lose its shape over time. Small pieces of bone or cartilage may break off and float inside of the joint, which may cause more pain and damage to the joint. Small deposits of bone (osteophytes) may grow on the edges of the joint. Other symptoms may include:  A grating or scraping feeling inside the joint when you move it.  Popping or creaking sounds when you move.  Symptoms may affect one or more  joints. Osteoarthritis in a major joint, such as your knee or hip, can make it painful to walk or exercise. If you have osteoarthritis in your hands, you might not be able to grip items, twist your hand, or control small movements of your hands and fingers (fine motor skills). How is this diagnosed? This condition may be diagnosed based on:  Your medical history.  A physical exam.  Your symptoms.  X-rays of the affected joint(s).  Blood tests to rule out other types of arthritis.  How is this treated? There is no cure for this condition, but treatment can help to control pain and improve joint function. Treatment plans may include:  A prescribed exercise program that allows for rest and joint relief. You may work with a physical therapist.  A weight control plan.  Pain relief techniques, such as: ? Applying heat and cold to the joint. ? Electric pulses delivered to nerve endings under the skin (transcutaneous electrical nerve stimulation, or TENS). ? Massage. ? Certain nutritional supplements.  NSAIDs or prescription medicines to help relieve pain.  Medicine to help relieve pain and inflammation (corticosteroids). This can be given by mouth (orally) or as an injection.  Assistive devices, such as a brace, wrap, splint, specialized glove, or cane.  Surgery, such as: ? An osteotomy. This is done to reposition the bones and relieve pain or to remove loose pieces of bone and cartilage. ? Joint replacement surgery. You may need this surgery if you have very bad (advanced) osteoarthritis.  Follow these instructions at home: Activity  Rest your affected joints as directed by your health care provider.  Do not drive or use heavy machinery while taking prescription pain medicine.  Exercise as directed. Your health care provider or physical therapist may recommend specific types of exercise, such as: ? Strengthening exercises. These are done to strengthen the muscles that support  joints that are affected by arthritis. They can be performed with weights or with exercise bands to add resistance. ? Aerobic activities. These are exercises, such as brisk walking or water aerobics, that get your heart pumping. ? Range-of-motion activities. These keep your joints easy to move. ? Balance and agility exercises. Managing pain, stiffness, and swelling  If directed, apply heat to the affected area as often as told by your health care provider. Use the heat source that your health care provider recommends, such as a moist heat pack or a heating pad. ? If you have a removable assistive device, remove it as told by your health care provider. ? Place a towel between your skin and the heat source. If your health care provider tells you to keep the assistive device on while you apply heat, place a towel between the assistive device and the heat source. ? Leave the heat on for 20-30 minutes. ? Remove the heat if your skin turns bright red. This is especially important if you are unable to feel pain, heat, or cold. You may have a greater risk of getting burned.  If directed, put ice on the affected joint: ? If you have a removable assistive device, remove it as told by your health care provider. ? Put ice in a plastic bag. ? Place a towel between your skin and the bag. If your health care provider tells you to keep the assistive device on during icing, place a towel between the assistive device and the bag. ? Leave the ice on for 20 minutes, 2-3 times a day. General instructions  Take over-the-counter and prescription medicines only as told by your health care provider.  Maintain a healthy weight. Follow instructions from your health care provider for weight control. These may include dietary restrictions.  Do not use any products that contain nicotine or tobacco, such as cigarettes and e-cigarettes. These can delay bone healing. If you need help quitting, ask your health care  provider.  Use assistive devices as directed by your health care provider.  Keep all follow-up visits as told by your health care provider. This is important. Where to find more information:  [employer] of Arthritis and Musculoskeletal and Skin Diseases: www.niams.SouthExposed.es  [employer] on Aging: http://kim-miller.com/  American College of Rheumatology: www.rheumatology.org Contact a health care provider if:  Your skin turns red.  You develop a rash.  You have pain that gets worse.  You have a fever along with joint or muscle aches. Get help right away if:  You lose a lot of weight.  You suddenly lose your appetite.  You have night sweats. Summary  Osteoarthritis is a type of arthritis that affects tissue covering the ends of bones in joints (cartilage).  This condition is caused by age-related wearing down of cartilage that covers the ends of bones.  The main symptom of this condition is pain, swelling, and stiffness in the joint.  There is no cure for this condition, but treatment can help to control pain and improve joint function. This information is not intended to replace advice given to you by your health care provider.  Make sure you discuss any questions you have with your health care provider. Document Released: 07/22/2005 Document Revised: 03/25/2016 Document Reviewed: 03/25/2016 Elsevier Interactive Patient Education  [employer].

## 2017-06-20 NOTE — Assessment & Plan Note (Signed)
  Chronic, patient is physically active which helps. Pain controlled w/ mobic and tramadol  -tramadol refilled -referral to sports med made, she has had knee/shoulder injections in past w/ some relief

## 2017-06-20 NOTE — Assessment & Plan Note (Signed)
  Patient does not want surgery on this, would consider injection. Advised toe strap.   -referral made to sports med per pt preference

## 2017-07-01 ENCOUNTER — Encounter: Payer: Self-pay | Admitting: Family Medicine

## 2017-07-01 ENCOUNTER — Ambulatory Visit (INDEPENDENT_AMBULATORY_CARE_PROVIDER_SITE_OTHER): Payer: Medicare Other | Admitting: Family Medicine

## 2017-07-01 DIAGNOSIS — M6530 Trigger finger, unspecified finger: Secondary | ICD-10-CM

## 2017-07-01 DIAGNOSIS — M2042 Other hammer toe(s) (acquired), left foot: Secondary | ICD-10-CM

## 2017-07-01 MED ORDER — METHYLPREDNISOLONE ACETATE 40 MG/ML IJ SUSP
40.0000 mg | Freq: Once | INTRAMUSCULAR | Status: AC
  Administered 2017-07-01: 40 mg via INTRA_ARTICULAR

## 2017-07-04 DIAGNOSIS — M6530 Trigger finger, unspecified finger: Secondary | ICD-10-CM | POA: Insufficient documentation

## 2017-07-04 NOTE — Progress Notes (Signed)
  Shelli Portilla - 76 y.o. female MRN [id number]  Date of birth: 21-Sep-1940    SUBJECTIVE:      Chief Complaint:/ HPI:  1. Has painful left second toe. Seen by PCP and referred here for further evaluation. Has been having problems with it for years. Became more painful in the last 6 months. #2. Fourth /ring finger on the right hand is painful in flexion. Sometimes sticks in this position. This is been going on about a month. #3. She has multiple other hand joint pains that are chronic. Also notes in deformity of her fingers. Feels like this is osteoarthritis wonders if there is something different she can do for been just doing pain management.   ROS:     Pertinent review of systems: negative for fever or unusual weight change.  PERTINENT  PMH / PSH FH / / SH:  Past Medical, Surgical, Social, and Family History Reviewed & Updated in the EMR.  Pertinent findings include:  Hypertension Multiple sites of osteoarthritis   OBJECTIVE: BP (!) 162/80   Ht 5\' 2"  (1.575 m)   Wt 155 lb (70.3 kg)   BMI 28.35 kg/m   Physical Exam:  Vital signs are reviewed. GEN.: Well-developed thin female no acute distress FEET: Bilaterally she has loss of transverse arch. The left second toe is held and hammer position. There is mild callus formation on the medial surface. There is no ulceration of the skin. No tenderness over the metatarsal head area. There is poor movement of the MTP and IP joint secondary to osteoarthritis. NEURO: Intact sensation to soft touch bilateral hands and feet FINGERS: Right fourth finger has a nodule at the A1 pulley. She has full range of motion flexion extension but has to assist full extension secondary sticking.  PROCEDURE: INJECTION: Patient was given informed consent, signed copy in the chart. Appropriate time out was taken. Area prepped and draped in usual sterile fashion. Ethyl chloride was  used for local anesthesia. A 21 gauge 1 1/2 inch needle was used.. One half cc of  methylprednisolone 40 mg/ml plus  one half cc of 1% lidocaine without epinephrine was injected into the A1 pulley area of the fourth right finger using a(n) volar approach.   The patient tolerated the procedure well. There were no complications. Post procedure instructions were given.   ASSESSMENT & PLAN:  See problem based charting & AVS for pt instructions.

## 2017-07-04 NOTE — Assessment & Plan Note (Signed)
I tried her in a hammertoe upon since she found it very uncomfortable. Says she tried one of the over-the-counter ones previously. She is currently using a sleeve for that toe which will protect it from excessive callus and pressure. Had nothing additional to offer her except for surgical intervention which she does not want to consider.

## 2017-07-04 NOTE — Assessment & Plan Note (Signed)
CSI today F/u 3-4 weeks

## 2017-07-22 ENCOUNTER — Ambulatory Visit: Admitting: Family Medicine

## 2017-07-25 ENCOUNTER — Ambulatory Visit (INDEPENDENT_AMBULATORY_CARE_PROVIDER_SITE_OTHER): Payer: Medicare Other | Admitting: Family Medicine

## 2017-07-25 ENCOUNTER — Encounter: Payer: Self-pay | Admitting: Family Medicine

## 2017-07-25 DIAGNOSIS — M6530 Trigger finger, unspecified finger: Secondary | ICD-10-CM

## 2017-07-25 DIAGNOSIS — M25511 Pain in right shoulder: Secondary | ICD-10-CM

## 2017-07-25 DIAGNOSIS — M25519 Pain in unspecified shoulder: Secondary | ICD-10-CM | POA: Insufficient documentation

## 2017-07-25 MED ORDER — METHYLPREDNISOLONE ACETATE 40 MG/ML IJ SUSP
40.0000 mg | Freq: Once | INTRAMUSCULAR | Status: AC
  Administered 2017-07-25: 40 mg via INTRA_ARTICULAR

## 2017-07-25 NOTE — Addendum Note (Signed)
Addended by: Cyd Silence on: 07/25/2017 12:57 PM   Modules accepted: Orders

## 2017-07-25 NOTE — Assessment & Plan Note (Signed)
I do not have access to her prior imaging studies.  I would like to get those at some point.  From what she describes, it sounds like she has chronic rotator cuff syndrome.  Some history of rotator cuff injury, I am not sure if she has an existing tear or not.  It sounds like her orthopedist has been giving her twice yearly subacromial bursa injections.  I gave her one today.  She will follow-up as needed.  I agreed that I would keep these to twice a year if possible.

## 2017-07-25 NOTE — Assessment & Plan Note (Signed)
Resolution of sticking of her trigger finger after her corticosteroid injection at last office visit.  Follow-up as needed.  She is aware that this may return.

## 2017-07-25 NOTE — Progress Notes (Signed)
    CHIEF COMPLAINT / HPI: #1.  Follow-up trigger finger.  Totally resolved.  Very happy about this 2.  Right shoulder pain.  Has had some imaging studies done in the past which showed a rotator cuff tear.  She had been previously followed by an orthopedist who gave her twice yearly injections in the shoulder.  These injections significantly help her pain.  It has been greater than 6 months since her last when she wonders if we could do her injection today.  Her pain is greatest if she abducts the right arm over her head.  This bothers her when she does tai chi.  She is right-hand dominant.  She also has some pain in forward flexion.  REVIEW OF SYSTEMS: Denies any numbness or tingling in the right upper extremity.  No unusual weight change.  No fevers.  PERTINENT  PMH / PSH: I have reviewed the patient's medications, allergies, past medical and surgical history, smoking status and updated in the EMR as appropriate.   OBJECTIVE: GENERAL: Well-developed female no acute distress HANDS: Bilateral hands reveal multiple PIP and DIP joint deformities secondary to osteoarthritis.  She has a small nontender nodule in the right palm.  She has full range of motion of flexion extension of all fingers and thumb. SHOULDER: Right.  Full range of motion but pain with abduction or forward flexion above 90 degrees.  The bicep tendon area is nontender to palpation.  Her strength is actually symmetrical with that of the left rotator cuff muscles. VASCULAR: Radial pulses 2+ bilaterally symmetrical NEURO: Intact sensation to soft touch bilateral hands.  PROCEDURE: INJECTION: Patient was given informed consent, signed copy in the chart. Appropriate time out was taken. Area prepped and draped in usual sterile fashion. Ethyl chloride was  used for local anesthesia. A 21 gauge 1 1/2 inch needle was used.. 1 cc of methylprednisolone 40 mg/ml plus 4 cc of 1% lidocaine without epinephrine was injected into the right  subacromial bursa using a(n) posterior approach.   The patient tolerated the procedure well. There were no complications. Post procedure instructions were given.   ASSESSMENT / PLAN: Please see problem oriented charting for details 

## 2017-10-09 ENCOUNTER — Telehealth: Payer: Self-pay | Admitting: Family Medicine

## 2017-10-16 NOTE — Telephone Encounter (Signed)
Fax from pharmacy stating pt is requesting a 90 day supply. Please advise.

## 2017-10-17 NOTE — Telephone Encounter (Signed)
I'll send in a refill next month. Thanks

## 2017-10-20 NOTE — Telephone Encounter (Signed)
Pharmacy calling stating that tramadol per patient should be written for 90 tabs but was instead written for 30.  They would like to know if it is ok to dispense 90.  Will forward to MD to advise. Jazmin Hartsell,CMA

## 2017-10-20 NOTE — Telephone Encounter (Signed)
Jazmin, Based on Angela response from similar question last week, it looks like she will just refill it on a monthly basis. Let's keep it that way until next week when she returns we can check with her.  Marjie Skiff, MD [employer], PGY-2

## 2017-10-21 ENCOUNTER — Telehealth: Payer: Self-pay

## 2017-10-21 NOTE — Telephone Encounter (Signed)
Pt called nurse line very upset that her Tramadol Rx is only for 30 tabs instead of 90. Pt requesting to speak to Dr. Vanetta Shawl- stated several times how upset she is that her medicine for her arthritis is being "cut back". Her call back [phone] Wallace Cullens, RN

## 2017-10-21 NOTE — Telephone Encounter (Signed)
Janie with [facility] made aware via [employer]. Danley Danker, RN [facility] RN)

## 2017-10-22 NOTE — Telephone Encounter (Signed)
I talked to the patient, and she is expecting a call from Dr. Vanetta Shawl. For now, I think she understand.   Thanks  Marjie Skiff, MD [facility], PGY-2

## 2017-10-24 ENCOUNTER — Observation Stay (HOSPITAL_COMMUNITY)
Admission: EM | Admit: 2017-10-24 | Discharge: 2017-10-25 | Disposition: A | Discharge status: Home / Self Care | Payer: Medicare Other | Attending: Family Medicine | Admitting: Family Medicine

## 2017-10-24 ENCOUNTER — Encounter (HOSPITAL_COMMUNITY): Payer: Self-pay

## 2017-10-24 ENCOUNTER — Other Ambulatory Visit: Payer: Self-pay

## 2017-10-24 ENCOUNTER — Emergency Department (HOSPITAL_COMMUNITY): Payer: Medicare Other

## 2017-10-24 DIAGNOSIS — Z91040 Latex allergy status: Secondary | ICD-10-CM | POA: Diagnosis not present

## 2017-10-24 DIAGNOSIS — E782 Mixed hyperlipidemia: Secondary | ICD-10-CM | POA: Diagnosis not present

## 2017-10-24 DIAGNOSIS — I639 Cerebral infarction, unspecified: Secondary | ICD-10-CM | POA: Diagnosis not present

## 2017-10-24 DIAGNOSIS — I10 Essential (primary) hypertension: Secondary | ICD-10-CM | POA: Diagnosis not present

## 2017-10-24 DIAGNOSIS — Z79899 Other long term (current) drug therapy: Secondary | ICD-10-CM | POA: Diagnosis not present

## 2017-10-24 DIAGNOSIS — H532 Diplopia: Secondary | ICD-10-CM | POA: Diagnosis not present

## 2017-10-24 DIAGNOSIS — Z7982 Long term (current) use of aspirin: Secondary | ICD-10-CM | POA: Diagnosis not present

## 2017-10-24 DIAGNOSIS — Z87891 Personal history of nicotine dependence: Secondary | ICD-10-CM | POA: Diagnosis not present

## 2017-10-24 LAB — CBC
HCT: 46.6 % — ABNORMAL HIGH (ref 36.0–46.0)
HEMOGLOBIN: 15.8 g/dL — AB (ref 12.0–15.0)
MCH: 31.7 pg (ref 26.0–34.0)
MCHC: 33.9 g/dL (ref 30.0–36.0)
MCV: 93.6 fL (ref 78.0–100.0)
Platelets: 226 10*3/uL (ref 150–400)
RBC: 4.98 MIL/uL (ref 3.87–5.11)
RDW: 12.7 % (ref 11.5–15.5)
WBC: 5.6 10*3/uL (ref 4.0–10.5)

## 2017-10-24 LAB — DIFFERENTIAL
Basophils Absolute: 0 10*3/uL (ref 0.0–0.1)
Basophils Relative: 0 %
EOS PCT: 2 %
Eosinophils Absolute: 0.1 10*3/uL (ref 0.0–0.7)
LYMPHS ABS: 1.9 10*3/uL (ref 0.7–4.0)
LYMPHS PCT: 33 %
Monocytes Absolute: 0.4 10*3/uL (ref 0.1–1.0)
Monocytes Relative: 8 %
NEUTROS PCT: 57 %
Neutro Abs: 3.2 10*3/uL (ref 1.7–7.7)

## 2017-10-24 LAB — RAPID URINE DRUG SCREEN, HOSP PERFORMED
Amphetamines: NOT DETECTED
Barbiturates: NOT DETECTED
Benzodiazepines: NOT DETECTED
Cocaine: NOT DETECTED
Opiates: NOT DETECTED
TETRAHYDROCANNABINOL: NOT DETECTED

## 2017-10-24 LAB — COMPREHENSIVE METABOLIC PANEL
ALBUMIN: 4.6 g/dL (ref 3.5–5.0)
ALK PHOS: 89 U/L (ref 38–126)
ALT: 20 U/L (ref 14–54)
AST: 34 U/L (ref 15–41)
Anion gap: 13 (ref 5–15)
BILIRUBIN TOTAL: 0.9 mg/dL (ref 0.3–1.2)
BUN: 16 mg/dL (ref 6–20)
CALCIUM: 9.8 mg/dL (ref 8.9–10.3)
CO2: 24 mmol/L (ref 22–32)
CREATININE: 1.03 mg/dL — AB (ref 0.44–1.00)
Chloride: 104 mmol/L (ref 101–111)
GFR calc Af Amer: >60 mL/min (ref >60)
GFR calc non Af Amer: 52 mL/min — ABNORMAL LOW (ref >60)
GLUCOSE: 101 mg/dL — AB (ref 65–99)
Potassium: 4 mmol/L (ref 3.5–5.1)
Sodium: 141 mmol/L (ref 135–145)
TOTAL PROTEIN: 7.7 g/dL (ref 6.5–8.1)

## 2017-10-24 LAB — I-STAT CHEM 8, ED
BUN: 17 mg/dL (ref 6–20)
CALCIUM ION: 1.17 mmol/L (ref 1.15–1.40)
CHLORIDE: 104 mmol/L (ref 101–111)
CREATININE: 1.1 mg/dL — AB (ref 0.44–1.00)
GLUCOSE: 99 mg/dL (ref 65–99)
HCT: 48 % — ABNORMAL HIGH (ref 36.0–46.0)
Hemoglobin: 16.3 g/dL — ABNORMAL HIGH (ref 12.0–15.0)
Potassium: 3.9 mmol/L (ref 3.5–5.1)
SODIUM: 141 mmol/L (ref 135–145)
TCO2: 27 mmol/L (ref 22–32)

## 2017-10-24 LAB — APTT: aPTT: 28 seconds (ref 24–36)

## 2017-10-24 LAB — PROTIME-INR
INR: 1.09
Prothrombin Time: 14 seconds (ref 11.4–15.2)

## 2017-10-24 LAB — URINALYSIS, ROUTINE W REFLEX MICROSCOPIC
Bilirubin Urine: NEGATIVE
Glucose, UA: NEGATIVE mg/dL
Hgb urine dipstick: NEGATIVE
KETONES UR: 5 mg/dL — AB
LEUKOCYTES UA: NEGATIVE
NITRITE: NEGATIVE
PROTEIN: NEGATIVE mg/dL
Specific Gravity, Urine: 1.004 — ABNORMAL LOW (ref 1.005–1.030)
pH: 8 (ref 5.0–8.0)

## 2017-10-24 LAB — I-STAT TROPONIN, ED: Troponin i, poc: 0 ng/mL (ref 0.00–0.08)

## 2017-10-24 LAB — CBG MONITORING, ED: Glucose-Capillary: 114 mg/dL — ABNORMAL HIGH (ref 65–99)

## 2017-10-24 MED ORDER — ACETAMINOPHEN 325 MG PO TABS: 650.0000 mg | ORAL_TABLET | ORAL | Status: DC | PRN

## 2017-10-24 MED ORDER — ACETAMINOPHEN 650 MG RE SUPP: 650.0000 mg | RECTAL | Status: DC | PRN

## 2017-10-24 MED ORDER — IRBESARTAN 300 MG PO TABS
300.0000 mg | ORAL_TABLET | Freq: Every day | ORAL | Status: DC
  Administered 2017-10-25: 300 mg via ORAL
  Filled 2017-10-24: qty 1

## 2017-10-24 MED ORDER — STROKE: EARLY STAGES OF RECOVERY BOOK
Freq: Once | Status: AC
  Administered 2017-10-25

## 2017-10-24 MED ORDER — ASPIRIN EC 81 MG PO TBEC
81.0000 mg | DELAYED_RELEASE_TABLET | Freq: Every evening | ORAL | Status: DC
  Administered 2017-10-25: 81 mg via ORAL
  Filled 2017-10-24: qty 1

## 2017-10-24 MED ORDER — ACETAMINOPHEN 160 MG/5ML PO SOLN: 650.0000 mg | ORAL | Status: DC | PRN

## 2017-10-24 MED ORDER — VERAPAMIL HCL ER 120 MG PO TBCR
120.0000 mg | EXTENDED_RELEASE_TABLET | Freq: Every day | ORAL | Status: DC
  Administered 2017-10-25: 120 mg via ORAL
  Filled 2017-10-24: qty 1

## 2017-10-24 NOTE — ED Notes (Signed)
Pt transported to CT ?

## 2017-10-24 NOTE — Consult Note (Signed)
Neurology Consultation Reason for Consult: Diplopia Referring Physician: Ardelia Mems, B  CC: Diplopia  History is obtained from: Patient  HPI: Jessica Beck is a 77 y.o. female with a history of diplopia since this morning.  She denies any history of headaches.  She denies any true unsteadiness, but states that if she tries to walk with both eyes open she feels a little off balance, but with closing of either eye she is not unsteady at all.  She denies any numbness, weakness, headache, nausea or vomiting, or other symptoms.   LKW: 9 AM tpa given?: no, mild symptoms, out of window Premorbid modified rankin scale: 0 NIHSS: 0   ROS: A 14 point ROS was performed and is negative except as noted in the HPI.   Past Medical History:  Diagnosis Date  . Hypertension   . IBS (irritable bowel syndrome)   . Migraine 03/07/2017     Family history: Mother-strokes  Social History:  reports that she quit smoking about 43 years ago. She has never used smokeless tobacco. She reports that she drinks alcohol. Her drug history is not on file.   Exam: Current vital signs: BP (!) 187/78 (BP Location: Left Arm)   Pulse 78   Temp 98 F (36.7 C) (Oral)   Resp 20   Ht 5\' 2"  (1.575 m)   Wt 74.4 kg (164 lb 0.4 oz)   SpO2 95%   BMI 30.00 kg/m  Vital signs in last 24 hours: Temp:  [98 F (36.7 C)-98.4 F (36.9 C)] 98 F (36.7 C) (03/22 2125) Pulse Rate:  [77-100] 78 (03/22 2125) Resp:  [12-22] 20 (03/22 2125) BP: (142-195)/(75-121) 187/78 (03/22 2125) SpO2:  [94 %-97 %] 95 % (03/22 2125) Weight:  [74.4 kg (164 lb)-74.4 kg (164 lb 0.4 oz)] 74.4 kg (164 lb 0.4 oz) (03/22 2125)   Physical Exam  Constitutional: Appears well-developed and well-nourished.  Psych: Affect appropriate to situation Eyes: No scleral injection HENT: No OP obstrucion Head: Normocephalic.  Cardiovascular: Normal rate and regular rhythm.  Respiratory: Effort normal, non-labored breathing GI: Soft.  No distension.  There is no tenderness.  Skin: WDI  Neuro: Mental Status: Patient is awake, alert, oriented to person, place, month, year, and situation. Patient is able to give a clear and coherent history. No signs of aphasia or neglect Cranial Nerves: II: Visual Fields are full. Pupils are equal, round, and reactive to light.   III,IV, VI: She has diplopia which is worse on leftward gaze as well as downward gaze V: Facial sensation is symmetric to temperature VII: Facial movement is symmetric.  VIII: hearing is intact to voice X: Uvula elevates symmetrically XI: Shoulder shrug is symmetric. XII: tongue is midline without atrophy or fasciculations.  Motor: Tone is normal. Bulk is normal. 5/5 strength was present in all four extremities.  Sensory: Sensation is symmetric to light touch and temperature in the arms and legs. Cerebellar: FNF and HKS are intact bilaterally   I have reviewed labs in epic and the results pertinent to this consultation are: CMP-unremarkable  I have reviewed the images obtained: CT head-unremarkable  Impression: 77 year old female with new onset diplopia.  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  The isolated nature makes cerebral ischemia less likely, but this will need to be ruled out with MRI.  Recommendations: 1) continue antiplatelet therapy with aspirin 2) LDL, A1c 3) MRI brain 4) echo, CTA head neck only if MRI is positive. 5) PT, OT, ST if MRI is positive.  Roland Rack, MD [facility] [phone]  If 7pm- 7am, please page neurology on call as listed in [place].

## 2017-10-24 NOTE — H&P (Addendum)
[facility] Admission History and Physical Service Pager: [phone]  Patient name: Jessica Beck Medical record number: [id number] Date of birth: 1941/02/24 Age: 77 y.o. Gender: female  Primary Care Provider: Steve Rattler, DO Consultants: Neurology Code Status: Full  Chief Complaint: Diplopia   Assessment and Plan: Jessica Beck is a 77 y.o. female past medical history significant for hypertension, osteoarthritis, GERD, asthma who presented with diplopia concerning for possible stroke.  Patient was admitted for stroke workup.  #Diplopia, acute, and resolved Patient presented with acute onset of diplopia without any signs of dizziness, headaches or trauma.  Head CT was negative for any signs of stroke.  No change in mental status, patient has normal neurologic exam without any deficit noted.  Patient denies any prior episodes similar to this.  Patient appears to have binocular diplopia therefore only present with both eyes open.  Vision return to normal when one eye is covered.  Patient does report worsening of diplopia with left lateral  and downward gaze.  Presentation seems to be more consistent with possible lateral rectus muscle palsy.  Could also consider fourth nerve palsy.  Patient also has a remote history of migraine headache which could also be causing diplopia, however it is unlikely in the absence of aura or photophobia.  Patient also admitted with elevated blood pressure 195/108 which could also be causing vision changes. Will order MRI to rule out possible stroke.  Neurology recommend holding off on typical stroke workup given patient's current symptoms and initial test results. Unconvinced that symptoms are secondary to stroke. --Admit to [facility], admitting physician Dr. Ardelia Mems --Order MRI brain --Follow-up neurology consult, appreciate recommendations --Order hemoglobin A1c and lipid panel --Consider consulting/referring to  ophthalmology --Acetaminophen as needed --Continue ASA 81 mg daily  #Hypertension Patient has a history of hypertension well controlled on home regimen.   On admission blood pressure was 195/108, and has normalized throughout the day. Currently 140/75. --We will continue home regimen of telmisartan (substituted for ibesartan), verapamil.  #Osteoarthritis At baseline, the patient reports extensive history.  We will continue to monitor. --Tylenol as needed --PRN tramadol  #GERD --Continue Protonix 40 mg daily  FEN/GI: Heart healthy Prophylaxis: SCDs until MRI  Disposition: Home pending workup  History of Present Illness:  Jessica Beck is a 77 y.o. female past medical history significant for hypertension, osteoarthritis, GERD, and asthma who presented today with diplopia.  Patient reports that symptoms started this morning around 9 AM after she woke up. Patient started to experience double vision worse in her left eye than her right.  Patient noticed that when she covered one eye she was able to regain normal vision.  Patient reports mild imbalance secondary to her diplopia.  She denies any dizziness, room spinning, nausea, vomiting.  Patient reports  diplopia is worse when she looks left and down but is much improved when she looks right and up.  Patient denies any similar presentation in the past.  Patient does have a history of migraines.  Patient denies any headaches, weakness, trouble speaking, On initial presentation in the ED, there were concerns of possible stroke.  Neurology was consulted and patient was not considered a candidate for tPA. CT head was within normal limits.  Remaining labs were within normal limits and UDS was negative.  EKG showed normal sinus rhythm. Patient was transferred to [facility] for stroke workup. Family medicine teaching service was consulted for admission.  Review Of Systems: Per HPI with the following additions:  Review of Systems  Constitutional:  Negative.   HENT: Negative.   Eyes: Positive for double vision.  Respiratory: Negative.   Cardiovascular: Negative.   Gastrointestinal: Negative.   Genitourinary: Negative.   Musculoskeletal: Negative.   Skin: Negative.   Neurological: Negative.   Endo/Heme/Allergies: Negative.   Psychiatric/Behavioral: Negative.     Patient Active Problem List   Diagnosis Date Noted  . Diplopia 10/24/2017  . Pain in joint, shoulder region 07/25/2017  . Trigger finger, acquired 07/04/2017  . Hammer toe of left foot 06/20/2017  . Migraine 03/07/2017  . IBS (irritable bowel syndrome) 03/07/2017  . HTN (hypertension) 03/07/2017  . Osteoarthritis 03/07/2017  . Mixed hyperlipidemia 03/07/2017  . Malnutrition of moderate degree 09/19/2016  . Acute appendicitis 09/15/2016  . Acute gangrenous perforated appendicitis s/p lap appendectomy 09/14/2016 09/14/2016    Past Medical History: Past Medical History:  Diagnosis Date  . Hypertension   . IBS (irritable bowel syndrome)   . Migraine 03/07/2017    Past Surgical History: Past Surgical History:  Procedure Laterality Date  . LAPAROSCOPIC APPENDECTOMY N/A 09/14/2016   Procedure: APPENDECTOMY LAPAROSCOPIC;  Surgeon: Armandina Gemma, MD;  Location: [facility] ORS;  Service: General;  Laterality: N/A;    Social History: Social History   Tobacco Use  . Smoking status: Former Smoker    Last attempt to quit: 08/05/1974    Years since quitting: 43.2  . Smokeless tobacco: Never Used  Substance Use Topics  . Alcohol use: Yes  . Drug use: Not on file   Additional social history:  Please also refer to relevant sections of EMR.  Family History: History reviewed. No pertinent family history. (If not completed, MUST add something in)  Allergies and Medications: Allergies  Allergen Reactions  . Flexeril [Cyclobenzaprine] Anxiety and Rash  . Latex Other (See Comments)    Not reported reaction   . Oxycodone Hives  . Sulfa Antibiotics Other (See Comments)     Not reported reaction  rash   No current facility-administered medications on file prior to encounter.    Current Outpatient Medications on File Prior to Encounter  Medication Sig Dispense Refill  . aspirin EC 81 MG tablet Take 81 mg by mouth every evening.     Marland Kitchen co-enzyme Q-10 30 MG capsule Resume this medication next week when you're feeling better. (Patient taking differently: Take 30 mg by mouth at bedtime. )    . magnesium oxide (MAG-OX) 400 MG tablet Take 400 mg by mouth every evening.     . meloxicam (MOBIC) 15 MG tablet Take 15 mg by mouth daily.    . Multiple Vitamins-Minerals (ICAPS AREDS 2 PO) Take 1 capsule by mouth daily.    . Multiple Vitamins-Minerals (MULTIVITAMIN ADULT) TABS Take 1 tablet by mouth every evening.    . Omega 3 1000 MG CAPS Resume this medication next week when you're feeling better. (Patient taking differently: Take 1 capsule by mouth 2 (two) times daily. ) 90 each   . omeprazole (PRILOSEC) 20 MG capsule Take 20 mg by mouth daily.    . Probiotic Product (ALIGN PO) Take 1 capsule by mouth daily.    Marland Kitchen telmisartan (MICARDIS) 80 MG tablet Take 1 tablet (80 mg total) daily by mouth. 90 tablet 3  . traMADol (ULTRAM) 50 MG tablet TAKE 1 TABLET 3 TIMES A DAY AS NEEDED. (Patient taking differently: TAKE 1 TABLET PO 3 TIMES A DAY AS NEEDED PAIN) 30 tablet 0  . verapamil (CALAN-SR) 120 MG CR tablet Take 120 mg  by mouth daily.    Marland Kitchen VITAMIN E PO Take 1 tablet by mouth every evening.    . Cholecalciferol (VITAMIN D3) 2000 units TABS Resume this medication next week when you are feeling better. (Patient not taking: Reported on 10/24/2017) 30 tablet   . Polyethyl Glycol-Propyl Glycol (SYSTANE) 0.4-0.3 % SOLN Place 1 drop into both eyes daily as needed (dry eyes).     Marland Kitchen PROAIR HFA 108 (90 Base) MCG/ACT inhaler Take 2 puffs by mouth daily as needed for wheezing or shortness of breath.     . triamcinolone cream (KENALOG) 0.1 % Apply 1 application daily as needed topically. (Patient  taking differently: Apply 1 application topically daily as needed (rash). ) 453.6 g 2    Objective: BP (!) 187/78 (BP Location: Left Arm)   Pulse 78   Temp 98 F (36.7 C) (Oral)   Resp 20   Ht 5\' 2"  (1.575 m)   Wt 164 lb 0.4 oz (74.4 kg)   SpO2 95%   BMI 30.00 kg/m     General: NAD, pleasant, able to participate in exam HEENT: PERRLA, extraocular movement intact, no visual field deficit however patient does endorse diplopia bilaterally, moist mucous membrane, neck supple Cardiac: RRR, normal heart sounds, no murmurs. 2+ radial and PT pulses bilaterally Respiratory: CTAB, normal effort, No wheezes, rales or rhonchi Abdomen: soft, nontender, nondistended, no hepatic or splenomegaly, +BS Extremities: no edema or cyanosis. WWP. Skin: warm and dry, no rashes noted Neuro: alert and oriented x4, cranial nerves II through XII intact, normal finger to nose, normal rapid alternating movement, strength +5 bilaterally upper and lower extremity, no gaze defici, sensation intact upper and lower extremity Psych: Normal affect and mood   Labs and Imaging: CBC BMET  Recent Labs  Lab 10/24/17 1446 10/24/17 1455  WBC 5.6  --   HGB 15.8* 16.3*  HCT 46.6* 48.0*  PLT 226  --    Recent Labs  Lab 10/24/17 1446 10/24/17 1455  NA 141 141  K 4.0 3.9  CL 104 104  CO2 24  --   BUN 16 17  CREATININE 1.03* 1.10*  GLUCOSE 101* 99  CALCIUM 9.8  --      Ct Head Code Stroke Wo Contrast  Result Date: 10/24/2017 CLINICAL DATA:  Code stroke. Acute presentation with diplopia beginning today. EXAM: CT HEAD WITHOUT CONTRAST TECHNIQUE: Contiguous axial images were obtained from the base of the skull through the vertex without intravenous contrast. COMPARISON:  None. FINDINGS: Brain: The brain does not show any evidence of old or acute focal infarction, mass lesion, hemorrhage, hydrocephalus or extra-axial collection. Vascular: There is atherosclerotic calcification of the major vessels at the base of the  brain. Skull: Normal Sinuses/Orbits: Clear/normal Other: None ASPECTS ([place] Stroke Program Early CT Score) - Ganglionic level infarction (caudate, lentiform nuclei, internal capsule, insula, M1-M3 cortex): 7 - Supraganglionic infarction (M4-M6 cortex): 3 Total score (0-10 with 10 being normal): 10 IMPRESSION: 1. Normal head CT for age. No sign of acute stroke. Atherosclerotic calcification of the major vessels at the base of the brain. 2. ASPECTS is 10. 3. These results were called by telephone at the time of interpretation on 10/24/2017 at 2:40 pm to Dr. Davonna Belling , who verbally acknowledged these results. Electronically Signed   By: Nelson Chimes M.D.   On: 10/24/2017 14:41    Marjie Skiff, MD 10/24/2017, 11:42 PM PGY-2, [facility] pager: [phone], text pages welcome

## 2017-10-24 NOTE — ED Notes (Signed)
[facility] contacted for transport to [place] 

## 2017-10-24 NOTE — Consult Note (Signed)
Date of Service 10/24/2017  [facility] Tele[facility] Consult Services  Comments: Last time known well:  _ 9:00 Door time:  _13:10 [facility] contacted: _15:02 initial stamp, then I was notified at 15:46) [facility] at bedside: _15:30 NIHSS assessment time: _1533 consult end time: _1544 --------------------------------------------------------------------------- Impression:  acute onset vertigo and diplopia, Consistent with Acute Ischemic Stroke  Does not meet Large Vessel Occlusion (LVO) screening criteria (aphasia, neglect, gaze deviation, dense hemiparesis, or visual field deficits on exam), therefore advanced imaging (CTA head and neck and CTP brain) is not indicated.   Differential Diagnosis:  1. Cardioembolic stroke  2. Small vessel disease/ lacune  3. Thromboembolic, artery-to-artery mechanism  4. Hypercoagulable state-related infarct  5. Transient ischemic attack  6. Thrombotic mechanism, large artery disease   ------------------------------------------------------------------------------------------- tPA decision and other recommendations:   _  Patient is not a tPA candidate Head CT did not show any acute hemorrhage. reviewed report (if available) and images  Reason: _ last time known well>4.5 hours, NIHSS 0 (though has some double vision)  Patient is not a NIR candidate: _ Thrombectomy not considered since large proximal intracranial vessel occlusion is not suspected.  Recommendations dysphagia screen ASA if no contraindications head of bed flat IV fluids NS Stroke work up with: noncontrast brain MRI, head and neck MRA (or CTA), 2D ECHO, lipid panel, HbA1c (Goal LDL<70, HbA1c<7) Physical Therapy/Occupational Therapy  inpatient neurology consultation Inpatient stroke evaluation as per Neurology/ Internal Medicine Discussed with ED physician/medical  staff   -------------------------------------------------------------------------------------------- Reason for Stroke Alert and History of Present Illness:  _ Patient is a 77 years old female, with history of hypertension, GERD; takes aspirin 81mg   last known well: 9:00 when starting feeling off balance then started having double vision which progressed.   ---------------------------------------------------------------------------------------------- Review of Systems:  Constitutional:  Negative except as documented in history of present illness.   Eye:  Negative except as documented in history of present illness.   Ear/Nose/Mouth/Throat:  Negative except as documented in history of present illness.   Respiratory:  Negative except as documented in history of present illness.   Cardiovascular:  Negative except as documented in history of present illness.   Gastrointestinal:  Negative except as documented in history of present illness.   Musculoskeletal:  Negative except as documented in history of present illness.   Neurologic:  Negative except as documented in history of present illness.    --------------------------------------------------------------------------------------------------- Examination:   NIHSS Details documented below ___ # NIHSS - NIH STROKE SCALE - 11 items:  1A: Level of consciousness: 0 - Alert; keenly responsive.  1B: Ask month and age: 60 - Both questions right  1C: 'Blink eyes' & 'squeeze hands': 0 - Performs both tasks  2: Horizontal extraocular movements: 0 - Normal.  3: Visual fields: 0 - No visual loss.  4: Facial palsy: 0 - Normal symmetry.  5A: Left arm motor drift: 0 - No drift for 10 seconds, or Amputation/joint fusion.  5B: Right arm motor drift: 0 - No drift for 10 seconds, or Amputation/joint fusion.  6A: Left leg motor drift: 0 - No drift for 5 seconds, or Amputation/joint fusion.  6B: Right leg motor drift: 0 - No drift for 5 seconds,  or Amputation/joint fusion.  7: Limb Ataxia: 0 - No ataxia, or does not understand, or Paralyzed, or Amputation/joint fusion.  8: Sensation: 0 - Normal; no sensory loss.  9: Language/aphasia: 0 - Normal; no aphasia.  10: Dysarthria: 0 - Normal, or Intubated/unable to test  11: Extinction/inattention: 0 -  No abnormality   == Score: 0 ------------------------------------------------------------------------------------------------------- Medical Decision Making:  - Extensive number of diagnosis or management options are considered above.   - Extensive amount of complex data reviewed.   - High risk of complication and/or morbidity or mortality are associated with differential diagnostic considerations above.  - There may be Uncertain outcome and increased probability of prolonged functional impairment or high probability of severe prolonged functional impairment associated with some of these differential diagnoses.  Medical Data Reviewed:  1.Data reviewed include clinical labs, radiology, Medical Tests;   2.Tests results discussed w/performing or interpreting physician;   3.Obtaining/reviewing old medical records;  4.Obtaining case history from another source;  5.Independent review of image, tracing or specimen.    When possible Patient/family were informed the Neurology Consult would happen via telehealth (remote video) and consented to receiving care in this manner.    Case discussed with the Medical staff. Critical Care notation:  I was called to see this critical patient emergently. I personally evaluated this critical patient for acute stroke evaluation and determining their eligibility for IV Alteplase and interventional therapies. I have spent approximately _14_ minutes with the patient, including time at bedside, time discussing the case with other physicians, reviewing plan of care, and time independently reviewing the records and scans.

## 2017-10-24 NOTE — ED Notes (Signed)
ED TO INPATIENT HANDOFF REPORT  Name/Age/Gender Jessica Beck 77 y.o. female  Code Status Code Status History    Date Active Date Inactive Code Status Order ID Comments User Context   09/14/2016 1518 09/20/2016 1456 Full Code [id number]  Armandina Gemma, MD ED    Advance Directive Documentation     Most Recent Value  Type of Advance Directive  Healthcare Power of Attorney  Pre-existing out of facility DNR order (yellow form or pink MOST form)  -  "MOST" Form in Place?  -      Home/SNF/Other Home  Chief Complaint Hypertention;Diplopia  Level of Care/Admitting Diagnosis ED Disposition    ED Disposition Condition [place]: [place] [100100]  Level of Care: Telemetry [5]  Diagnosis: Diplopia [368.2.ICD-9-CM]  Admitting Physician: Leeanne Rio [2010]  Attending Physician: Leeanne Rio [phone]  Estimated length of stay: past midnight tomorrow  Certification:: I certify this patient will need inpatient services for at least 2 midnights  PT Class (Do Not Modify): Inpatient [101]  PT Acc Code (Do Not Modify): Private [1]       Medical History Past Medical History:  Diagnosis Date  . Hypertension   . IBS (irritable bowel syndrome)   . Migraine 03/07/2017    Allergies Allergies  Allergen Reactions  . Flexeril [Cyclobenzaprine] Anxiety and Rash  . Latex Other (See Comments)    Not reported reaction   . Oxycodone Hives  . Sulfa Antibiotics Other (See Comments)    Not reported reaction  rash    IV Location/Drains/Wounds Patient Lines/Drains/Airways Status   Active Line/Drains/Airways    Name:   Placement date:   Placement time:   Site:   Days:   Peripheral IV 10/24/17 Right Antecubital   10/24/17    1446    Antecubital   less than 1   Incision (Closed) 09/14/16 Abdomen Other (Comment)   09/14/16    1623     405          Labs/Imaging Results for orders placed or performed during the hospital encounter of  10/24/17 (from the past 48 hour(s))  CBG monitoring, ED     Status: Abnormal   Collection Time: 10/24/17  1:21 PM  Result Value Ref Range   Glucose-Capillary 114 (H) 65 - 99 mg/dL  Protime-INR     Status: None   Collection Time: 10/24/17  2:46 PM  Result Value Ref Range   Prothrombin Time 14.0 11.4 - 15.2 seconds   INR 1.09     Comment: Performed at [facility], [place] [address], [place], [place] [address]  APTT     Status: None   Collection Time: 10/24/17  2:46 PM  Result Value Ref Range   aPTT 28 24 - 36 seconds    Comment: Performed at [facility], [place] [address], [place], [place] [address]  CBC     Status: Abnormal   Collection Time: 10/24/17  2:46 PM  Result Value Ref Range   WBC 5.6 4.0 - 10.5 K/uL   RBC 4.98 3.87 - 5.11 MIL/uL   Hemoglobin 15.8 (H) 12.0 - 15.0 g/dL   HCT 46.6 (H) 36.0 - 46.0 %   MCV 93.6 78.0 - 100.0 fL   MCH 31.7 26.0 - 34.0 pg   MCHC 33.9 30.0 - 36.0 g/dL   RDW 12.7 11.5 - 15.5 %   Platelets 226 150 - 400 K/uL    Comment: Performed at [facility]  [place] [address], [place], [place] [address]  Differential     Status: None   Collection Time: 10/24/17  2:46 PM  Result Value Ref Range   Neutrophils Relative % 57 %   Neutro Abs 3.2 1.7 - 7.7 K/uL   Lymphocytes Relative 33 %   Lymphs Abs 1.9 0.7 - 4.0 K/uL   Monocytes Relative 8 %   Monocytes Absolute 0.4 0.1 - 1.0 K/uL   Eosinophils Relative 2 %   Eosinophils Absolute 0.1 0.0 - 0.7 K/uL   Basophils Relative 0 %   Basophils Absolute 0.0 0.0 - 0.1 K/uL    Comment: Performed at [facility], [place] [address] [place], [place] [address]  Comprehensive metabolic panel     Status: Abnormal   Collection Time: 10/24/17  2:46 PM  Result Value Ref Range   Sodium 141 135 - 145 mmol/L   Potassium 4.0 3.5 - 5.1 mmol/L   Chloride 104 101 - 111 mmol/L   CO2 24 22 - 32 mmol/L   Glucose, Bld 101 (H) 65 - 99 mg/dL   BUN 16 6 - 20 mg/dL    Creatinine, Ser 1.03 (H) 0.44 - 1.00 mg/dL   Calcium 9.8 8.9 - 10.3 mg/dL   Total Protein 7.7 6.5 - 8.1 g/dL   Albumin 4.6 3.5 - 5.0 g/dL   AST 34 15 - 41 U/L   ALT 20 14 - 54 U/L   Alkaline Phosphatase 89 38 - 126 U/L   Total Bilirubin 0.9 0.3 - 1.2 mg/dL   GFR calc non Af Amer 52 (L) >60 mL/min   GFR calc Af Amer >60 >60 mL/min    Comment: (NOTE) The eGFR has been calculated using the CKD EPI equation. This calculation has not been validated in all clinical situations. eGFR's persistently <60 mL/min signify possible Chronic Kidney Disease.    Anion gap 13 5 - 15    Comment: Performed at [facility], [place] [address], [place], [place] [address]  I-stat troponin, ED     Status: None   Collection Time: 10/24/17  2:52 PM  Result Value Ref Range   Troponin i, poc 0.00 0.00 - 0.08 ng/mL   Comment 3            Comment: Due to the release kinetics of cTnI, a negative result within the first hours of the onset of symptoms does not rule out myocardial infarction with certainty. If myocardial infarction is still suspected, repeat the test at appropriate intervals.   I-Stat Chem 8, ED     Status: Abnormal   Collection Time: 10/24/17  2:55 PM  Result Value Ref Range   Sodium 141 135 - 145 mmol/L   Potassium 3.9 3.5 - 5.1 mmol/L   Chloride 104 101 - 111 mmol/L   BUN 17 6 - 20 mg/dL   Creatinine, Ser 1.10 (H) 0.44 - 1.00 mg/dL   Glucose, Bld 99 65 - 99 mg/dL   Calcium, Ion 1.17 1.15 - 1.40 mmol/L   TCO2 27 22 - 32 mmol/L   Hemoglobin 16.3 (H) 12.0 - 15.0 g/dL   HCT 48.0 (H) 36.0 - 46.0 %  Urine rapid drug screen (hosp performed)     Status: None   Collection Time: 10/24/17  4:49 PM  Result Value Ref Range   Opiates NONE DETECTED NONE DETECTED   Cocaine NONE DETECTED NONE DETECTED   Benzodiazepines NONE DETECTED NONE DETECTED   Amphetamines NONE DETECTED NONE DETECTED  Tetrahydrocannabinol NONE DETECTED NONE DETECTED   Barbiturates NONE DETECTED NONE DETECTED     Comment: (NOTE) DRUG SCREEN FOR MEDICAL PURPOSES ONLY.  IF CONFIRMATION IS NEEDED FOR ANY PURPOSE, NOTIFY LAB WITHIN 5 DAYS. LOWEST DETECTABLE LIMITS FOR URINE DRUG SCREEN Drug Class                     Cutoff (ng/mL) Amphetamine and metabolites    1000 Barbiturate and metabolites    200 Benzodiazepine                 017 Tricyclics and metabolites     300 Opiates and metabolites        300 Cocaine and metabolites        300 THC                            50 Performed at [facility], [place] [address], [place], [place] [address]   Urinalysis, Routine w reflex microscopic     Status: Abnormal   Collection Time: 10/24/17  4:49 PM  Result Value Ref Range   Color, Urine STRAW (A) YELLOW   APPearance CLEAR CLEAR   Specific Gravity, Urine 1.004 (L) 1.005 - 1.030   pH 8.0 5.0 - 8.0   Glucose, UA NEGATIVE NEGATIVE mg/dL   Hgb urine dipstick NEGATIVE NEGATIVE   Bilirubin Urine NEGATIVE NEGATIVE   Ketones, ur 5 (A) NEGATIVE mg/dL   Protein, ur NEGATIVE NEGATIVE mg/dL   Nitrite NEGATIVE NEGATIVE   Leukocytes, UA NEGATIVE NEGATIVE    Comment: Performed at [facility] [address] [place], [place] [address]   Ct Head Code Stroke Wo Contrast  Result Date: 10/24/2017 CLINICAL DATA:  Code stroke. Acute presentation with diplopia beginning today. EXAM: CT HEAD WITHOUT CONTRAST TECHNIQUE: Contiguous axial images were obtained from the base of the skull through the vertex without intravenous contrast. COMPARISON:  None. FINDINGS: Brain: The brain does not show any evidence of old or acute focal infarction, mass lesion, hemorrhage, hydrocephalus or extra-axial collection. Vascular: There is atherosclerotic calcification of the major vessels at the base of the brain. Skull: Normal Sinuses/Orbits: Clear/normal Other: None ASPECTS ([place] Stroke Program Early CT Score) - Ganglionic level infarction (caudate, lentiform nuclei, internal capsule, insula, M1-M3  cortex): 7 - Supraganglionic infarction (M4-M6 cortex): 3 Total score (0-10 with 10 being normal): 10 IMPRESSION: 1. Normal head CT for age. No sign of acute stroke. Atherosclerotic calcification of the major vessels at the base of the brain. 2. ASPECTS is 10. 3. These results were called by telephone at the time of interpretation on 10/24/2017 at 2:40 pm to Dr. Davonna Belling , who verbally acknowledged these results. Electronically Signed   By: Nelson Chimes M.D.   On: 10/24/2017 14:41    Pending Labs Unresulted Labs (From admission, onward)   Start     Ordered   10/24/17 1420  Ethanol  STAT,   STAT     10/24/17 1420      Vitals/Pain Today's Vitals   10/24/17 1700 10/24/17 1730 10/24/17 1800 10/24/17 1830  BP: (!) 152/84 (!) 149/88 (!) 155/91 (!) 154/79  Pulse: 83 83  83  Resp: 14 16 (!) 21 18  Temp:      TempSrc:      SpO2: 95% 95%  97%  Weight:      Height:        Isolation Precautions No active isolations  Medications Medications - No data to display  Mobility Walks with device

## 2017-10-24 NOTE — ED Provider Notes (Signed)
[facility] DEPT Provider Note   CSN: [id number] Arrival date & time: 10/24/17  1310     History   Chief Complaint Chief Complaint  Patient presents with  . Hypertension  . Diplopia    HPI Jessica Beck is a 77 y.o. female.  HPI Patient presents with double vision.  Acute onset at around 9:00 this morning.  She was up prior to this and did work around the house and outside.  But at 9:00 she sat down to read and found that there was double vision.  The double vision goes away if she closes either of her eyes.  No headache.  States she has had some difficulty walking with it also.  No numbness weakness confusion or difficulty speaking.  Has had previous retinal issues and cataracts.  No previous stroke.  Not on anticoagulation. Past Medical History:  Diagnosis Date  . Hypertension   . IBS (irritable bowel syndrome)   . Migraine 03/07/2017    Patient Active Problem List   Diagnosis Date Noted  . Diplopia 10/24/2017  . Pain in joint, shoulder region 07/25/2017  . Trigger finger, acquired 07/04/2017  . Hammer toe of left foot 06/20/2017  . Migraine 03/07/2017  . IBS (irritable bowel syndrome) 03/07/2017  . HTN (hypertension) 03/07/2017  . Osteoarthritis 03/07/2017  . Mixed hyperlipidemia 03/07/2017  . Malnutrition of moderate degree 09/19/2016  . Acute appendicitis 09/15/2016  . Acute gangrenous perforated appendicitis s/p lap appendectomy 09/14/2016 09/14/2016    Past Surgical History:  Procedure Laterality Date  . LAPAROSCOPIC APPENDECTOMY N/A 09/14/2016   Procedure: APPENDECTOMY LAPAROSCOPIC;  Surgeon: Armandina Gemma, MD;  Location: [facility] ORS;  Service: General;  Laterality: N/A;     OB History   None      Home Medications    Prior to Admission medications   Medication Sig Start Date End Date Taking? Authorizing Provider  aspirin EC 81 MG tablet Take 81 mg by mouth every evening.    Yes [provider]  co-enzyme Q-10 30 MG  capsule Resume this medication next week when you're feeling better. Patient taking differently: Take 30 mg by mouth at bedtime.  09/20/16  Yes Earnstine Regal, PA-C  magnesium oxide (MAG-OX) 400 MG tablet Take 400 mg by mouth every evening.    Yes [provider]  meloxicam (MOBIC) 15 MG tablet Take 15 mg by mouth daily. 08/29/16  Yes [provider]  Multiple Vitamins-Minerals (ICAPS AREDS 2 PO) Take 1 capsule by mouth daily.   Yes [provider]  Multiple Vitamins-Minerals (MULTIVITAMIN ADULT) TABS Take 1 tablet by mouth every evening.   Yes [provider]  Omega 3 1000 MG CAPS Resume this medication next week when you're feeling better. Patient taking differently: Take 1 capsule by mouth 2 (two) times daily.  09/20/16  Yes Earnstine Regal, PA-C  omeprazole (PRILOSEC) 20 MG capsule Take 20 mg by mouth daily. 08/29/16  Yes [provider]  Probiotic Product (ALIGN PO) Take 1 capsule by mouth daily.   Yes [provider]  telmisartan (MICARDIS) 80 MG tablet Take 1 tablet (80 mg total) daily by mouth. 06/09/17  Yes Riccio, Angela C, DO  traMADol (ULTRAM) 50 MG tablet TAKE 1 TABLET 3 TIMES A DAY AS NEEDED. Patient taking differently: TAKE 1 TABLET PO 3 TIMES A DAY AS NEEDED PAIN 10/15/17  Yes Riccio, Angela C, DO  verapamil (CALAN-SR) 120 MG CR tablet Take 120 mg by mouth daily. 08/29/16  Yes [provider]  VITAMIN E PO Take 1 tablet by mouth every evening.   Yes [provider]  Cholecalciferol (VITAMIN D3) 2000 units TABS Resume this medication next week when you are feeling better. Patient not taking: Reported on 10/24/2017 09/20/16   Earnstine Regal, PA-C  Polyethyl Glycol-Propyl Glycol (SYSTANE) 0.4-0.3 % SOLN Place 1 drop into both eyes daily as needed (dry eyes).     [provider]  PROAIR HFA 108 [phone] Base) MCG/ACT inhaler Take 2 puffs by mouth daily as needed for wheezing or shortness of breath.  06/20/16    [provider]  triamcinolone cream (KENALOG) 0.1 % Apply 1 application daily as needed topically. Patient taking differently: Apply 1 application topically daily as needed (rash).  06/20/17   Steve Rattler, DO    Family History History reviewed. No pertinent family history.  Social History Social History   Tobacco Use  . Smoking status: Former Smoker    Last attempt to quit: 08/05/1974    Years since quitting: 43.2  . Smokeless tobacco: Never Used  Substance Use Topics  . Alcohol use: Yes  . Drug use: Not on file     Allergies   Flexeril [cyclobenzaprine]; Latex; Oxycodone; and Sulfa antibiotics   Review of Systems Review of Systems  Constitutional: Negative for appetite change.  HENT: Negative for congestion.   Eyes: Positive for visual disturbance.  Respiratory: Negative for shortness of breath.   Gastrointestinal: Negative for abdominal pain.  Genitourinary: Negative for flank pain.  Musculoskeletal: Negative for back pain.  Skin: Negative for rash.  Neurological: Negative for speech difficulty and numbness.  Psychiatric/Behavioral: Negative for confusion.     Physical Exam Updated Vital Signs BP (!) 156/90   Pulse 84   Temp 98.4 F (36.9 C)   Resp 18   Ht 5\' 2"  (1.575 m)   Wt 74.4 kg (164 lb)   SpO2 94%   BMI 30.00 kg/m   Physical Exam  Constitutional: She appears well-developed.  HENT:  Head: Normocephalic and atraumatic.  Eyes: Pupils are equal, round, and reactive to light.  May have slight decrease in left eye movement laterally and superior laterally.Double vision with gaze particularly to left.  Cardiovascular: Normal rate.  Abdominal: Soft.  Neurological: She is alert.  Finger-nose intact bilaterally.  May have slight decrease in left eye movement laterally and superior laterally.  Face symmetric.  Good grip strength.  No Romberg.  Normal gait.  More complete NIH scoring done by neurology.  Skin: Skin is warm. Capillary refill  takes less than 2 seconds.  Psychiatric: She has a normal mood and affect.     ED Treatments / Results  Labs (all labs ordered are listed, but only abnormal results are displayed) Labs Reviewed  CBC - Abnormal; Notable for the following components:      Result Value   Hemoglobin 15.8 (*)    HCT 46.6 (*)    All other components within normal limits  COMPREHENSIVE METABOLIC PANEL - Abnormal; Notable for the following components:   Glucose, Bld 101 (*)    Creatinine, Ser 1.03 (*)    GFR calc non Af Amer 52 (*)    All other components within normal limits  CBG MONITORING, ED - Abnormal; Notable for the following components:   Glucose-Capillary 114 (*)    All other components within normal limits  I-STAT CHEM 8, ED - Abnormal; Notable for the following components:   Creatinine, Ser 1.10 (*)    Hemoglobin 16.3 (*)  HCT 48.0 (*)    All other components within normal limits  PROTIME-INR  APTT  DIFFERENTIAL  ETHANOL  RAPID URINE DRUG SCREEN, HOSP PERFORMED  URINALYSIS, ROUTINE W REFLEX MICROSCOPIC  I-STAT TROPONIN, ED    EKG None  Radiology Ct Head Code Stroke Wo Contrast  Result Date: 10/24/2017 CLINICAL DATA:  Code stroke. Acute presentation with diplopia beginning today. EXAM: CT HEAD WITHOUT CONTRAST TECHNIQUE: Contiguous axial images were obtained from the base of the skull through the vertex without intravenous contrast. COMPARISON:  None. FINDINGS: Brain: The brain does not show any evidence of old or acute focal infarction, mass lesion, hemorrhage, hydrocephalus or extra-axial collection. Vascular: There is atherosclerotic calcification of the major vessels at the base of the brain. Skull: Normal Sinuses/Orbits: Clear/normal Other: None ASPECTS ([place] Stroke Program Early CT Score) - Ganglionic level infarction (caudate, lentiform nuclei, internal capsule, insula, M1-M3 cortex): 7 - Supraganglionic infarction (M4-M6 cortex): 3 Total score (0-10 with 10 being normal): 10  IMPRESSION: 1. Normal head CT for age. No sign of acute stroke. Atherosclerotic calcification of the major vessels at the base of the brain. 2. ASPECTS is 10. 3. These results were called by telephone at the time of interpretation on 10/24/2017 at 2:40 pm to Dr. Davonna Belling , who verbally acknowledged these results. Electronically Signed   By: Nelson Chimes M.D.   On: 10/24/2017 14:41    Procedures Procedures (including critical care time)  Medications Ordered in ED Medications - No data to display   Initial Impression / Assessment and Plan / ED Course  I have reviewed the triage vital signs and the nursing notes.  Pertinent labs & imaging results that were available during my care of the patient were reviewed by me and considered in my medical decision making (see chart for details).     EKG Interpretation  Date/Time:  Friday October 24 2017 14:55:03 EDT Ventricular Rate:  86 PR Interval:    QRS Duration: 83 QT Interval:  356 QTC Calculation: 426 R Axis:   13 Text Interpretation:  Sinus rhythm Probable left atrial enlargement Confirmed by Davonna Belling [phone]) on 10/24/2017 3:48:04 PM       Patient with diplopia.  Last normal was 9:00 this morning.  Discussed with Dr. Rory Percy from neurology after discussion it was decided to call a code stroke since it was less than 6 hours since the onset.  Head CT done and reassuring.  Seen by tele-neurology.  They did not call me but reviewing the report it appears as if he needs more of a stroke workup.  Not a TPA candidate due to size of deficits.  Neurology NIH of 0 but does have some double vision.  Otherwise nonfocal exam.  Patient is a family medicine patient and will transfer to [facility] for [facility].  CRITICAL CARE Performed by: Davonna Belling Total critical care time: 30 minutes Critical care time was exclusive of separately billable procedures and treating other patients. Critical care was necessary to treat or prevent  imminent or life-threatening deterioration. Critical care was time spent personally by me on the following activities: development of treatment plan with patient and/or surrogate as well as nursing, discussions with consultants, evaluation of patient's response to treatment, examination of patient, obtaining history from patient or surrogate, ordering and performing treatments and interventions, ordering and review of laboratory studies, ordering and review of radiographic studies, pulse oximetry and re-evaluation of patient's condition.   Final Clinical Impressions(s) / ED Diagnoses   Final diagnoses:  Diplopia    ED Discharge Orders    None       Davonna Belling, MD 10/24/17 [phone]

## 2017-10-24 NOTE — ED Notes (Signed)
Pt given eye patch and tape per her request.

## 2017-10-24 NOTE — ED Notes (Signed)
Patient transported to CT 

## 2017-10-24 NOTE — ED Notes (Signed)
Called [facility] to given report, unable to give report at this time. Told to call back after 10 minutes.

## 2017-10-24 NOTE — ED Notes (Signed)
Patient escorted herself to the restroom, did not ask for assistance, unable to obtain urine sample because of that. Patient currently aware urine sample is needed

## 2017-10-24 NOTE — ED Notes (Signed)
Called to give report, Shaune Leeks unavailable at this time. Awaiting a call back.

## 2017-10-24 NOTE — ED Triage Notes (Addendum)
Patient presents with diplopia, starting this morning and "gradually has gotten worse." Patient reports a history of hypertension and states she takes her blood pressure medication twice a day regularly. Patient denies headache. Patient reports "seeing perfectly fine when one eye is closed, but when I open both eyes, everything becomes doubled." Patient denies tingling, weakness, aphasia, difficulty ambulating. Patient denies CP/SOB. 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

## 2017-10-25 ENCOUNTER — Inpatient Hospital Stay (HOSPITAL_COMMUNITY): Payer: Medicare Other

## 2017-10-25 DIAGNOSIS — H532 Diplopia: Secondary | ICD-10-CM

## 2017-10-25 DIAGNOSIS — I639 Cerebral infarction, unspecified: Secondary | ICD-10-CM | POA: Diagnosis present

## 2017-10-25 LAB — LIPID PANEL
Cholesterol: 188 mg/dL (ref 0–200)
HDL: 80 mg/dL (ref >40)
LDL Cholesterol: 96 mg/dL (ref 0–99)
TRIGLYCERIDES: 58 mg/dL (ref <150)
Total CHOL/HDL Ratio: 2.4 RATIO
VLDL: 12 mg/dL (ref 0–40)

## 2017-10-25 LAB — CBC
HEMATOCRIT: 43.1 % (ref 36.0–46.0)
Hemoglobin: 14.2 g/dL (ref 12.0–15.0)
MCH: 31 pg (ref 26.0–34.0)
MCHC: 32.9 g/dL (ref 30.0–36.0)
MCV: 94.1 fL (ref 78.0–100.0)
Platelets: 205 10*3/uL (ref 150–400)
RBC: 4.58 MIL/uL (ref 3.87–5.11)
RDW: 12.8 % (ref 11.5–15.5)
WBC: 5.5 10*3/uL (ref 4.0–10.5)

## 2017-10-25 LAB — BASIC METABOLIC PANEL
ANION GAP: 11 (ref 5–15)
BUN: 11 mg/dL (ref 6–20)
CALCIUM: 9.3 mg/dL (ref 8.9–10.3)
CO2: 26 mmol/L (ref 22–32)
Chloride: 101 mmol/L (ref 101–111)
Creatinine, Ser: 0.95 mg/dL (ref 0.44–1.00)
GFR calc Af Amer: >60 mL/min (ref >60)
GFR calc non Af Amer: 57 mL/min — ABNORMAL LOW (ref >60)
Glucose, Bld: 109 mg/dL — ABNORMAL HIGH (ref 65–99)
Potassium: 3.9 mmol/L (ref 3.5–5.1)
Sodium: 138 mmol/L (ref 135–145)

## 2017-10-25 LAB — HEMOGLOBIN A1C
HEMOGLOBIN A1C: 5.3 % (ref 4.8–5.6)
MEAN PLASMA GLUCOSE: 105.41 mg/dL

## 2017-10-25 MED ORDER — PANTOPRAZOLE SODIUM 40 MG PO TBEC
40.0000 mg | DELAYED_RELEASE_TABLET | Freq: Every day | ORAL | Status: DC
  Administered 2017-10-25: 40 mg via ORAL
  Filled 2017-10-25: qty 1

## 2017-10-25 MED ORDER — ATORVASTATIN CALCIUM 40 MG PO TABS: 40.0000 mg | ORAL_TABLET | Freq: Every day | ORAL | Status: DC

## 2017-10-25 NOTE — Discharge Summary (Signed)
[facility] Discharge Summary  Patient name: Jessica Beck Medical record number: [id number] Date of birth: Dec 18, 1940 Age: 77 y.o. Gender: female Date of Admission: 10/24/2017  Date of Discharge: 10/25/2017 Admitting Physician: Leeanne Rio, MD  Primary Care Provider: Steve Rattler, DO Consultants: neurology  Indication for Hospitalization: Diplopia  Discharge Diagnoses/Problem List:  Patient Active Problem List   Diagnosis Date Noted  . 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 Diplopia 10/24/2017  . Pain in joint, shoulder region 07/25/2017  . Trigger finger, acquired 07/04/2017  . Hammer toe of left foot 06/20/2017  . Migraine 03/07/2017  . IBS (irritable bowel syndrome) 03/07/2017  . HTN (hypertension) 03/07/2017  . Osteoarthritis 03/07/2017  . Mixed hyperlipidemia 03/07/2017  . Malnutrition of moderate degree 09/19/2016  . Acute appendicitis 09/15/2016  . Acute gangrenous perforated appendicitis s/p lap appendectomy 09/14/2016 09/14/2016   Disposition: Home  Discharge Condition: stable  Discharge Exam: Copied from Dr. Carmelina Dane progress note on the day of discharge: Temp:  [97.7 F (36.5 C)-98.4 F (36.9 C)] 98.2 F (36.8 C) (03/23 0824) Pulse Rate:  [67-100] 70 (03/23 0824) Resp:  [12-22] 18 (03/23 0824) BP: (133-195)/(56-121) 141/77 (03/23 0824) SpO2:  [94 %-97 %] 97 % (03/23 0824) Weight:  [164 lb (74.4 kg)-164 lb 0.4 oz (74.4 kg)] 164 lb 0.4 oz (74.4 kg) (03/22 2125)   Physical Exam: General: NAD, pleasant, able to participate in exam HEENT:PERRLA, extraocular movement intact, no visual field deficit however patient does endorse diplopia bilaterally worse looking leftward and down, moist mucous membrane, neck supple Cardiac: RRR, normal heart sounds, no murmurs. 2+ radial and PT pulses bilaterally Respiratory: CTAB, normal effort, No wheezes, rales or rhonchi Abdomen: soft, nontender, nondistended, no hepatic or  splenomegaly, +BS Extremities:no edema or cyanosis. WWP. Skin:warm and dry, no rashes noted Neuro: alert and oriented x4,cranial nerves II through XII intact, normal finger to nose, normal rapid alternating movement, strength +5 bilaterally upper and lower extremity,no gaze defici, sensation intact upper and lower extremity Psych:Normal affect and mood    Brief Hospital Course:  77 year old female with HTN, asthma, GERD, osteoarthritis presented to the hospital with diplopia, left eye worse than right. She noted that when she covered one eye her vision improved. On initial presentation there was concern for possible stroke. However CT head was normal. Neurology was consulted and followed. 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 Other workup including UDS was negative, and EKG was NSR.   Patient was given an eye patch which she found helpful. Neurology recommended outpatient follow up with ophthalmology. Patient felt better with glasses taped. Neurology saw patient in hospital and answered questions, recommended glycemic and cholesterol control to improve neuropathy.   Issues for Follow Up:  1. Patient to follow up with ophthalmology for CN IV palsy 2. Patient refused statin in hospital, consider discussing alternative control for hyperlipidemia  Significant Procedures: None  Significant Labs and Imaging:  Recent Labs  Lab 10/24/17 1446 10/24/17 1455 10/25/17 0636  WBC 5.6  --  5.5  HGB 15.8* 16.3* 14.2  HCT 46.6* 48.0* 43.1  PLT 226  --  205   Recent Labs  Lab 10/24/17 1446 10/24/17 1455 10/25/17 0636  NA 141 141 138  K 4.0 3.9 3.9  CL 104 104 101  CO2 24  --  26  GLUCOSE 101* 99 109*  BUN 16 17 11   CREATININE 1.03* 1.10* 0.95  CALCIUM 9.8  --  9.3  ALKPHOS 89  --   --  AST 34  --   --   ALT 20  --   --   ALBUMIN 4.6  --   --     HbA1c 5.3% Lipid panel: Chol 188, Trig 58, HDL 80, VLDL 12, LDL 96  Mr Brain Wo Contrast  Result Date:  10/25/2017 CLINICAL DATA:  Initial evaluation for acute onset diplopia. EXAM: MRI HEAD WITHOUT CONTRAST MRA HEAD WITHOUT CONTRAST TECHNIQUE: Multiplanar, multiecho pulse sequences of the brain and surrounding structures were obtained without intravenous contrast. Angiographic images of the head were obtained using MRA technique without contrast. COMPARISON:  Prior CT from 10/24/2017. FINDINGS: MRI HEAD FINDINGS Brain: Cerebral volume within normal limits for age. Patchy multifocal T2/FLAIR hyperintensities seen involving the periventricular, deep, and subcortical white matter both cerebral hemispheres, nonspecific, but moderate nature. No abnormal foci of restricted diffusion to suggest acute or subacute ischemia. Gray-white matter differentiation maintained. No areas of chronic infarction. No acute or chronic intracranial hemorrhage. No mass lesion, midline shift or mass effect. No hydrocephalus. No extra-axial fluid collection. Major dural sinuses grossly patent. Pituitary gland suprasellar region normal. Midline structures intact and normal. Vascular: Major intracranial vascular flow voids are maintained. Skull and upper cervical spine: Craniocervical junction normal. Upper cervical spine normal. Bone marrow signal intensity within normal limits. No scalp soft tissue abnormality. Sinuses/Orbits: Globes and orbital soft tissues within normal limits. Paranasal sinuses are clear. No mastoid effusion. Inner ear structures normal. Other: None. MRA HEAD FINDINGS ANTERIOR CIRCULATION: Distal cervical segments of the internal carotid arteries are patent with antegrade flow. Petrous, cavernous, and supraclinoid segments patent without flow-limiting stenosis. ICA termini widely patent. A1 segments, anterior communicating artery common anterior cerebral arteries widely patent. No M1 stenosis or occlusion. No proximal M2 occlusion. Distal MCA branches well perfused and symmetric. POSTERIOR CIRCULATION: Left vertebral artery  dominant and widely patent to the vertebrobasilar junction. Right vertebral artery hypoplastic and largely terminates in PICA. Posterior inferior cerebral arteries patent bilaterally. Basilar artery mildly tortuous but widely patent to its distal aspect. Superior cerebral arteries patent bilaterally. Left PCA supplied via the basilar. Right PCA largely supplied by the right posterior communicating artery. There is a short-segment severe stenosis within the mid left P2 segment. PCAs otherwise widely patent. IMPRESSION: MRI HEAD IMPRESSION: 1. No acute intracranial abnormality. 2. Moderate T2/FLAIR hyperintensities involving the supratentorial cerebral white matter, nonspecific. Changes are most commonly associated with sequelae of chronic small vessel ischemic disease. Changes related to underlying migrainous disorder could also be considered given the history of migraine. Demyelinating disease could also potentially have this appearance, and could be considered in the correct clinical setting. MRA HEAD IMPRESSION: 1. Negative intracranial MRA for large vessel occlusion. 2. Single short-segment severe stenosis involving the mid left P2 segment. 3. Otherwise essentially normal intracranial MRA. Electronically Signed   By: Jeannine Boga M.D.   On: 10/25/2017 06:44   Mr Jodene Nam Head Wo Contrast  Result Date: 10/25/2017 CLINICAL DATA:  Initial evaluation for acute onset diplopia. EXAM: MRI HEAD WITHOUT CONTRAST MRA HEAD WITHOUT CONTRAST TECHNIQUE: Multiplanar, multiecho pulse sequences of the brain and surrounding structures were obtained without intravenous contrast. Angiographic images of the head were obtained using MRA technique without contrast. COMPARISON:  Prior CT from 10/24/2017. FINDINGS: MRI HEAD FINDINGS Brain: Cerebral volume within normal limits for age. Patchy multifocal T2/FLAIR hyperintensities seen involving the periventricular, deep, and subcortical white matter both cerebral hemispheres,  nonspecific, but moderate nature. No abnormal foci of restricted diffusion to suggest acute or subacute ischemia. Gray-white matter differentiation  maintained. No areas of chronic infarction. No acute or chronic intracranial hemorrhage. No mass lesion, midline shift or mass effect. No hydrocephalus. No extra-axial fluid collection. Major dural sinuses grossly patent. Pituitary gland suprasellar region normal. Midline structures intact and normal. Vascular: Major intracranial vascular flow voids are maintained. Skull and upper cervical spine: Craniocervical junction normal. Upper cervical spine normal. Bone marrow signal intensity within normal limits. No scalp soft tissue abnormality. Sinuses/Orbits: Globes and orbital soft tissues within normal limits. Paranasal sinuses are clear. No mastoid effusion. Inner ear structures normal. Other: None. MRA HEAD FINDINGS ANTERIOR CIRCULATION: Distal cervical segments of the internal carotid arteries are patent with antegrade flow. Petrous, cavernous, and supraclinoid segments patent without flow-limiting stenosis. ICA termini widely patent. A1 segments, anterior communicating artery common anterior cerebral arteries widely patent. No M1 stenosis or occlusion. No proximal M2 occlusion. Distal MCA branches well perfused and symmetric. POSTERIOR CIRCULATION: Left vertebral artery dominant and widely patent to the vertebrobasilar junction. Right vertebral artery hypoplastic and largely terminates in PICA. Posterior inferior cerebral arteries patent bilaterally. Basilar artery mildly tortuous but widely patent to its distal aspect. Superior cerebral arteries patent bilaterally. Left PCA supplied via the basilar. Right PCA largely supplied by the right posterior communicating artery. There is a short-segment severe stenosis within the mid left P2 segment. PCAs otherwise widely patent. IMPRESSION: MRI HEAD IMPRESSION: 1. No acute intracranial abnormality. 2. Moderate T2/FLAIR  hyperintensities involving the supratentorial cerebral white matter, nonspecific. Changes are most commonly associated with sequelae of chronic small vessel ischemic disease. Changes related to underlying migrainous disorder could also be considered given the history of migraine. Demyelinating disease could also potentially have this appearance, and could be considered in the correct clinical setting. MRA HEAD IMPRESSION: 1. Negative intracranial MRA for large vessel occlusion. 2. Single short-segment severe stenosis involving the mid left P2 segment. 3. Otherwise essentially normal intracranial MRA. Electronically Signed   By: Jeannine Boga M.D.   On: 10/25/2017 06:44   Ct Head Code Stroke Wo Contrast  Result Date: 10/24/2017 CLINICAL DATA:  Code stroke. Acute presentation with diplopia beginning today. EXAM: CT HEAD WITHOUT CONTRAST TECHNIQUE: Contiguous axial images were obtained from the base of the skull through the vertex without intravenous contrast. COMPARISON:  None. FINDINGS: Brain: The brain does not show any evidence of old or acute focal infarction, mass lesion, hemorrhage, hydrocephalus or extra-axial collection. Vascular: There is atherosclerotic calcification of the major vessels at the base of the brain. Skull: Normal Sinuses/Orbits: Clear/normal Other: None ASPECTS ([place] Stroke Program Early CT Score) - Ganglionic level infarction (caudate, lentiform nuclei, internal capsule, insula, M1-M3 cortex): 7 - Supraganglionic infarction (M4-M6 cortex): 3 Total score (0-10 with 10 being normal): 10 IMPRESSION: 1. Normal head CT for age. No sign of acute stroke. Atherosclerotic calcification of the major vessels at the base of the brain. 2. ASPECTS is 10. 3. These results were called by telephone at the time of interpretation on 10/24/2017 at 2:40 pm to Dr. Davonna Belling , who verbally acknowledged these results. Electronically Signed   By: Nelson Chimes M.D.   On: 10/24/2017 14:41     Results/Tests Pending at Time of Discharge: None  Discharge Medications:  Allergies as of 10/25/2017      Reactions   Flexeril [cyclobenzaprine] Anxiety, Rash   Latex Other (See Comments)   Not reported reaction    Oxycodone Hives   Sulfa Antibiotics Other (See Comments)   Not reported reaction  rash      Medication List  STOP taking these medications   telmisartan 80 MG tablet Commonly known as:  MICARDIS     TAKE these medications   ALIGN PO Take 1 capsule by mouth daily.   aspirin EC 81 MG tablet Take 81 mg by mouth every evening.   co-enzyme Q-10 30 MG capsule Resume this medication next week when you're feeling better. What changed:    how much to take  how to take this  when to take this  additional instructions   ICAPS AREDS 2 PO Take 1 capsule by mouth daily. What changed:  Another medication with the same name was removed. Continue taking this medication, and follow the directions you see here.   magnesium oxide 400 MG tablet Commonly known as:  MAG-OX Take 400 mg by mouth every evening.   meloxicam 15 MG tablet Commonly known as:  MOBIC Take 15 mg by mouth daily.   Omega 3 1000 MG Caps Resume this medication next week when you're feeling better. What changed:    how much to take  how to take this  when to take this  additional instructions   omeprazole 20 MG capsule Commonly known as:  PRILOSEC Take 20 mg by mouth daily.   PROAIR HFA 108 (90 Base) MCG/ACT inhaler Generic drug:  albuterol Take 2 puffs by mouth daily as needed for wheezing or shortness of breath.   SYSTANE 0.4-0.3 % Soln Generic drug:  Polyethyl Glycol-Propyl Glycol Place 1 drop into both eyes daily as needed (dry eyes).   traMADol 50 MG tablet Commonly known as:  ULTRAM TAKE 1 TABLET 3 TIMES A DAY AS NEEDED. What changed:  See the new instructions.   triamcinolone cream 0.1 % Commonly known as:  KENALOG Apply 1 application daily as needed  topically. What changed:  reasons to take this   verapamil 120 MG CR tablet Commonly known as:  CALAN-SR Take 120 mg by mouth daily.   Vitamin D3 2000 units Tabs Resume this medication next week when you are feeling better.   VITAMIN E PO Take 1 tablet by mouth every evening.       Discharge Instructions: Please refer to Patient Instructions section of EMR for full details.  Patient was counseled important signs and symptoms that should prompt return to medical care, changes in medications, dietary instructions, activity restrictions, and follow up appointments.   Follow-Up Appointments: Follow-up Information    Steve Rattler, DO Follow up on 11/05/2017.   Specialty:  Family Medicine Why:   Please arrive 15 minutes before your 2:50 PM appointment Contact information: [place] [phone]        Jalene Mullet, MD. Schedule an appointment as soon as possible for a visit.   Specialty:  Ophthalmology Contact information: [address] [place] [address] [phone]           Everrett Coombe, MD 10/25/2017, 4:39 PM PGY-2, [place]

## 2017-10-25 NOTE — Progress Notes (Addendum)
Occupational Therapy Evaluation Patient Details Name: Jessica Beck MRN: [id number] DOB: [birth year] Today's Date: 10/25/2017    History of Present Illness 77 y.o. female past medical history significant for hypertension, osteoarthritis, GERD, asthma who presented with diplopia concerning for possible stroke.  Patient was admitted for stroke workup.MRI-.    Clinical Impression   PTA, pt independent and lived alone. Pt describes her diplopia as more vertical with diplopia worse in L gaze. Partial occlusion used with taping nasal portion of R lens. Began education regarding use of partial occlusion to improve functional vision. Pt states taping significantly reduces her double vision. Will plan to see again prior to DC. Recommend pt follow up with her eye doctor to refer to optometrist if needed for temporary prisms. Pt verbalized understanding. Pt very appreciative. Pt safe to DC home with assistance of family. Recommend pt refrain from driving at this time.  Pt seen for additional visit. Pt states the taping is working well. Pt given written information regarding the use of partial occlusion and recommend pt follow up with her eye doctor. Pt asking to talk with the consulting neurologist - nsg made aware.     Follow Up Recommendations  No OT follow up;Supervision - Intermittent    Equipment Recommendations  None recommended by OT    Recommendations for Other Services Other (comment)(folow up with her eye doctor)     Precautions / Restrictions Precautions Precautions: Fall      Mobility Bed Mobility Overal bed mobility: Independent                Transfers Overall transfer level: Modified independent                    Balance Overall balance assessment: Needs assistance   Sitting balance-Leahy Scale: Good       Standing balance-Leahy Scale: Fair                             ADL either performed or assessed with clinical judgement   ADL Overall  ADL's : Needs assistance/impaired                                     Functional mobility during ADLs: Supervision/safety;Cane General ADL Comments: Pt comleteing ADL tasks with overall set up. Pt states she "can do things, it just takes longer" Pt feel more stable using her cane for ambulation. Recommend using cane or furniture walking within the home. Educated pt on home safety affected by impaired depth perception. Pt verbalzied understanding. Pt asked about returning to driving  - recommed pt not drive at this time and discuss this with her eye doctor.      Vision Baseline Vision/History: Wears glasses Wears Glasses: At all times Patient Visual Report: Diplopia Vision Assessment?: Yes Eye Alignment: Within Functional Limits Ocular Range of Motion: Restricted on the left;Restricted looking down;Impaired-to be further tested in functional context Alignment/Gaze Preference: Within Defined Limits Tracking/Visual Pursuits: Decreased smoothness of horizontal tracking;Decreased smoothness of vertical tracking Saccades: Decreased speed of saccadic movement;Impaired - to be further tested in functional context Convergence: Within functional limits Visual Fields: No apparent deficits Diplopia Assessment: Objects split on top of one another;Disappears with one eye closed;Present in far gaze;Only with left gaze(better in R gaze) Depth Perception: Undershoots Additional Comments: Appears R eye dominanat but staets her vision is better in her L  eye; used partial occlusion on R eye     Perception     Praxis      Pertinent Vitals/Pain Pain Assessment: No/denies pain     Hand Dominance Right   Extremity/Trunk Assessment Upper Extremity Assessment Upper Extremity Assessment: Overall WFL for tasks assessed   Lower Extremity Assessment Lower Extremity Assessment: Overall WFL for tasks assessed   Cervical / Trunk Assessment Cervical / Trunk Assessment: Normal   Communication  Communication Communication: No difficulties   Cognition Arousal/Alertness: Awake/alert Behavior During Therapy: WFL for tasks assessed/performed Overall Cognitive Status: Within Functional Limits for tasks assessed                                     General Comments       Exercises     Shoulder Instructions      Home Living Family/patient expects to be discharged to:: Private residence Living Arrangements: Alone Available Help at Discharge: Family;Available PRN/intermittently Type of Home: House Home Access: Stairs to enter [employer] of Steps: 3 Entrance Stairs-Rails: Right;Left Home Layout: One level;Laundry or work area in basement     [employer] Shower/Tub: [facility];Door   [employer] Toilet: Standard Bathroom Accessibility: Yes How Accessible: Accessible via walker Home Equipment: Walker - 2 wheels;Shower seat - built in          Prior Functioning/Environment Level of Independence: Independent        Comments: drove        OT Problem List: Impaired vision/perception      OT Treatment/Interventions: Self-care/ADL training;Therapeutic activities;Visual/perceptual remediation/compensation;Patient/family education    OT Goals(Current goals can be found in the care plan section) Acute Rehab OT Goals Patient Stated Goal: to go home and be independent OT Goal Formulation: With patient Potential to Achieve Goals: Good  OT Frequency: Min 2X/week   Barriers to D/C:            Co-evaluation              AM-PAC PT "6 Clicks" Daily Activity     Outcome Measure Help from another person eating meals?: None Help from another person taking care of personal grooming?: None Help from another person toileting, which includes using toliet, bedpan, or urinal?: None Help from another person bathing (including washing, rinsing, drying)?: None Help from another person to put on and taking off regular upper body clothing?:  None Help from another person to put on and taking off regular lower body clothing?: None 6 Click Score: 24   End of Session Nurse Communication: Mobility status;Other (comment)(use of partial occlusion)  Activity Tolerance: Patient tolerated treatment well Patient left: in chair;with call bell/phone within reach  OT Visit Diagnosis: Low vision, both eyes (H54.2)                Time: 8502-7741 OT Time Calculation (min): 27 min  1426 - 1439 Time: 13 min  Charges:  OT General Charges $OT Visit: 1 Visit OT Evaluation $OT Eval Moderate Complexity: 1 Mod OT Treatments $Therapeutic Activity: 23-37 mins G-Codes:     [facility], OT/L  [phone] 10/25/2017  [place],HILLARY 10/25/2017, 11:24 AM

## 2017-10-25 NOTE — Progress Notes (Signed)
Family Medicine Teaching Service Daily Progress Note Intern Pager: [phone]  Patient name: Jessica Beck Medical record number: [id number] Date of birth: [birth year] Age: 77 y.o. Gender: female  Primary Care Provider: Steve Rattler, DO Consultants: Neurology Code Status: Full   Pt Overview and Major Events to Date:  Admitted 3/22  Assessment and Plan: Beverly Ferner is a 77 y.o. female past medical history significant for hypertension, osteoarthritis, GERD, asthma who presented with diplopia concerning for possible stroke.  Patient was admitted for stroke workup.  #Diplopia, acute, and resolved Patient reports mild improvement in diploplia this morning. MRI/MRA brain was negative for acute stroke. 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 Lipid panel and A1c are within normal limits. No further stroke work up testing needed. Stable for discharge with outpatient follow up as needed. --Outpatient follow up with ophthalmology --Continue ASA 81 mg daily --Patient with a history of severe myalgias while on statin.  #Hypertension Mildly elevated at 141/77. Titrate home meds with PCP. --We will continue home regimen of telmisartan and verapamil.   #Osteoarthritis At baseline, the patient reports extensive history.  We will continue to monitor. --Can resume home regimen with meloxican and tramadol as needed  #GERD --Continue Protonix 40 mg dail   FEN/GI: heart healthy PPx: protonix  Disposition: Appropriate for discharge   Subjective:  Patient is well appearing and endorse slight improvement in diplopia. Concern about symptoms resolution because she lives alone and this has been debilitating. Taking good po and ambulating.  Objective: Temp:  [97.7 F (36.5 C)-98.4 F (36.9 C)] 98.2 F (36.8 C) (03/23 0824) Pulse Rate:  [67-100] 70 (03/23 0824) Resp:  [12-22] 18 (03/23 0824) BP: (133-195)/(56-121) 141/77 (03/23 0824) SpO2:  [94 %-97 %] 97 %  (03/23 0824) Weight:  [164 lb (74.4 kg)-164 lb 0.4 oz (74.4 kg)] 164 lb 0.4 oz (74.4 kg) (03/22 2125)   Physical Exam: General: NAD, pleasant, able to participate in exam HEENT: PERRLA, extraocular movement intact, no visual field deficit however patient does endorse diplopia bilaterally worse looking leftward and down, moist mucous membrane, neck supple Cardiac: RRR, normal heart sounds, no murmurs. 2+ radial and PT pulses bilaterally Respiratory: CTAB, normal effort, No wheezes, rales or rhonchi Abdomen: soft, nontender, nondistended, no hepatic or splenomegaly, +BS Extremities: no edema or cyanosis. WWP. Skin: warm and dry, no rashes noted Neuro: alert and oriented x4, cranial nerves II through XII intact, normal finger to nose, normal rapid alternating movement, strength +5 bilaterally upper and lower extremity, no gaze defici, sensation intact upper and lower extremity Psych: Normal affect and mood   Laboratory: Recent Labs  Lab 10/24/17 1446 10/24/17 1455 10/25/17 0636  WBC 5.6  --  5.5  HGB 15.8* 16.3* 14.2  HCT 46.6* 48.0* 43.1  PLT 226  --  205   Recent Labs  Lab 10/24/17 1446 10/24/17 1455 10/25/17 0636  NA 141 141 138  K 4.0 3.9 3.9  CL 104 104 101  CO2 24  --  26  BUN 16 17 11   CREATININE 1.03* 1.10* 0.95  CALCIUM 9.8  --  9.3  PROT 7.7  --   --   BILITOT 0.9  --   --   ALKPHOS 89  --   --   ALT 20  --   --   AST 34  --   --   GLUCOSE 101* 99 109*    Lipid Panel     Component Value Date/Time   CHOL 188 10/25/2017 0418  CHOL 210 (H) 03/07/2017 1149   TRIG 58 10/25/2017 0418   HDL 80 10/25/2017 0418   HDL 77 03/07/2017 1149   CHOLHDL 2.4 10/25/2017 0418   VLDL 12 10/25/2017 0418   LDLCALC 96 10/25/2017 0418   LDLCALC 110 (H) 03/07/2017 1149    Imaging/Diagnostic Tests: Mr Brain Wo Contrast  Result Date: 10/25/2017 CLINICAL DATA:  Initial evaluation for acute onset diplopia. EXAM: MRI HEAD WITHOUT CONTRAST MRA HEAD WITHOUT CONTRAST TECHNIQUE:  Multiplanar, multiecho pulse sequences of the brain and surrounding structures were obtained without intravenous contrast. Angiographic images of the head were obtained using MRA technique without contrast. COMPARISON:  Prior CT from 10/24/2017. FINDINGS: MRI HEAD FINDINGS Brain: Cerebral volume within normal limits for age. Patchy multifocal T2/FLAIR hyperintensities seen involving the periventricular, deep, and subcortical white matter both cerebral hemispheres, nonspecific, but moderate nature. No abnormal foci of restricted diffusion to suggest acute or subacute ischemia. Gray-white matter differentiation maintained. No areas of chronic infarction. No acute or chronic intracranial hemorrhage. No mass lesion, midline shift or mass effect. No hydrocephalus. No extra-axial fluid collection. Major dural sinuses grossly patent. Pituitary gland suprasellar region normal. Midline structures intact and normal. Vascular: Major intracranial vascular flow voids are maintained. Skull and upper cervical spine: Craniocervical junction normal. Upper cervical spine normal. Bone marrow signal intensity within normal limits. No scalp soft tissue abnormality. Sinuses/Orbits: Globes and orbital soft tissues within normal limits. Paranasal sinuses are clear. No mastoid effusion. Inner ear structures normal. Other: None. MRA HEAD FINDINGS ANTERIOR CIRCULATION: Distal cervical segments of the internal carotid arteries are patent with antegrade flow. Petrous, cavernous, and supraclinoid segments patent without flow-limiting stenosis. ICA termini widely patent. A1 segments, anterior communicating artery common anterior cerebral arteries widely patent. No M1 stenosis or occlusion. No proximal M2 occlusion. Distal MCA branches well perfused and symmetric. POSTERIOR CIRCULATION: Left vertebral artery dominant and widely patent to the vertebrobasilar junction. Right vertebral artery hypoplastic and largely terminates in PICA. Posterior  inferior cerebral arteries patent bilaterally. Basilar artery mildly tortuous but widely patent to its distal aspect. Superior cerebral arteries patent bilaterally. Left PCA supplied via the basilar. Right PCA largely supplied by the right posterior communicating artery. There is a short-segment severe stenosis within the mid left P2 segment. PCAs otherwise widely patent. IMPRESSION: MRI HEAD IMPRESSION: 1. No acute intracranial abnormality. 2. Moderate T2/FLAIR hyperintensities involving the supratentorial cerebral white matter, nonspecific. Changes are most commonly associated with sequelae of chronic small vessel ischemic disease. Changes related to underlying migrainous disorder could also be considered given the history of migraine. Demyelinating disease could also potentially have this appearance, and could be considered in the correct clinical setting. MRA HEAD IMPRESSION: 1. Negative intracranial MRA for large vessel occlusion. 2. Single short-segment severe stenosis involving the mid left P2 segment. 3. Otherwise essentially normal intracranial MRA. Electronically Signed   By: Jeannine Boga M.D.   On: 10/25/2017 06:44   Mr Jodene Nam Head Wo Contrast  Result Date: 10/25/2017 CLINICAL DATA:  Initial evaluation for acute onset diplopia. EXAM: MRI HEAD WITHOUT CONTRAST MRA HEAD WITHOUT CONTRAST TECHNIQUE: Multiplanar, multiecho pulse sequences of the brain and surrounding structures were obtained without intravenous contrast. Angiographic images of the head were obtained using MRA technique without contrast. COMPARISON:  Prior CT from 10/24/2017. FINDINGS: MRI HEAD FINDINGS Brain: Cerebral volume within normal limits for age. Patchy multifocal T2/FLAIR hyperintensities seen involving the periventricular, deep, and subcortical white matter both cerebral hemispheres, nonspecific, but moderate nature. No abnormal foci of restricted diffusion to  suggest acute or subacute ischemia. Gray-white matter  differentiation maintained. No areas of chronic infarction. No acute or chronic intracranial hemorrhage. No mass lesion, midline shift or mass effect. No hydrocephalus. No extra-axial fluid collection. Major dural sinuses grossly patent. Pituitary gland suprasellar region normal. Midline structures intact and normal. Vascular: Major intracranial vascular flow voids are maintained. Skull and upper cervical spine: Craniocervical junction normal. Upper cervical spine normal. Bone marrow signal intensity within normal limits. No scalp soft tissue abnormality. Sinuses/Orbits: Globes and orbital soft tissues within normal limits. Paranasal sinuses are clear. No mastoid effusion. Inner ear structures normal. Other: None. MRA HEAD FINDINGS ANTERIOR CIRCULATION: Distal cervical segments of the internal carotid arteries are patent with antegrade flow. Petrous, cavernous, and supraclinoid segments patent without flow-limiting stenosis. ICA termini widely patent. A1 segments, anterior communicating artery common anterior cerebral arteries widely patent. No M1 stenosis or occlusion. No proximal M2 occlusion. Distal MCA branches well perfused and symmetric. POSTERIOR CIRCULATION: Left vertebral artery dominant and widely patent to the vertebrobasilar junction. Right vertebral artery hypoplastic and largely terminates in PICA. Posterior inferior cerebral arteries patent bilaterally. Basilar artery mildly tortuous but widely patent to its distal aspect. Superior cerebral arteries patent bilaterally. Left PCA supplied via the basilar. Right PCA largely supplied by the right posterior communicating artery. There is a short-segment severe stenosis within the mid left P2 segment. PCAs otherwise widely patent. IMPRESSION: MRI HEAD IMPRESSION: 1. No acute intracranial abnormality. 2. Moderate T2/FLAIR hyperintensities involving the supratentorial cerebral white matter, nonspecific. Changes are most commonly associated with sequelae of  chronic small vessel ischemic disease. Changes related to underlying migrainous disorder could also be considered given the history of migraine. Demyelinating disease could also potentially have this appearance, and could be considered in the correct clinical setting. MRA HEAD IMPRESSION: 1. Negative intracranial MRA for large vessel occlusion. 2. Single short-segment severe stenosis involving the mid left P2 segment. 3. Otherwise essentially normal intracranial MRA. Electronically Signed   By: Jeannine Boga M.D.   On: 10/25/2017 06:44   Ct Head Code Stroke Wo Contrast  Result Date: 10/24/2017 CLINICAL DATA:  Code stroke. Acute presentation with diplopia beginning today. EXAM: CT HEAD WITHOUT CONTRAST TECHNIQUE: Contiguous axial images were obtained from the base of the skull through the vertex without intravenous contrast. COMPARISON:  None. FINDINGS: Brain: The brain does not show any evidence of old or acute focal infarction, mass lesion, hemorrhage, hydrocephalus or extra-axial collection. Vascular: There is atherosclerotic calcification of the major vessels at the base of the brain. Skull: Normal Sinuses/Orbits: Clear/normal Other: None ASPECTS ([place] Stroke Program Early CT Score) - Ganglionic level infarction (caudate, lentiform nuclei, internal capsule, insula, M1-M3 cortex): 7 - Supraganglionic infarction (M4-M6 cortex): 3 Total score (0-10 with 10 being normal): 10 IMPRESSION: 1. Normal head CT for age. No sign of acute stroke. Atherosclerotic calcification of the major vessels at the base of the brain. 2. ASPECTS is 10. 3. These results were called by telephone at the time of interpretation on 10/24/2017 at 2:40 pm to Dr. Davonna Belling , who verbally acknowledged these results. Electronically Signed   By: Nelson Chimes M.D.   On: 10/24/2017 14:41     Marjie Skiff, MD 10/25/2017, 10:47 AM PGY-2, [place] Intern pager: [phone], text pages welcome

## 2017-10-25 NOTE — Progress Notes (Signed)
Patient ready for discharge to home; discharge instructions given and reviewed; no Rx's; patient discharged out via wheelchair accompanied by her sister.

## 2017-10-25 NOTE — Progress Notes (Signed)
Chart review note  Advised to follow up MRI. MRI negative for acute process. Essentially unremarkable MRI and MRA of brain. 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  Recs: -Antiplatelets -Statin -Check A1c and LDL and treat to keep at goal -No need for echo or CTA -Outpatient ophtho follow up -Can see outpatient neurology if desired.  Please call inpatient neurology service with questions.  -- Amie Portland, MD [facility] Pager: [phone] If 7pm to 7am, please call on call as listed on AMION.

## 2017-10-25 NOTE — Evaluation (Signed)
Physical Therapy Evaluation Patient Details Name: Jessica Beck MRN: [id number] DOB: 07-27-1941 Today's Date: 10/25/2017   History of Present Illness  Pt is a 77 y.o. female past medical history significant for hypertension, osteoarthritis, GERD, asthma who presented with diplopia concerning for possible stroke.  Patient was admitted for stroke workup.MRI-.     Clinical Impression  Pt presented supine in bed with HOB elevated, awake and willing to participate in therapy session. Prior to admission, pt reported that she was very independent and active (walking daily and participates in tai chi). Pt able to perform bed mobility independently, transfers with modified independence and ambulated in hallway with supervision for safety. Pt participated in higher level balance assessment and scored a 24/24 on the DGI indicating that she is a safe community ambulator. No further acute PT needs identified at this time. PT signing off.     Follow Up Recommendations No PT follow up    Equipment Recommendations  None recommended by PT    Recommendations for Other Services       Precautions / Restrictions Precautions Precautions: Fall Restrictions Weight Bearing Restrictions: No      Mobility  Bed Mobility Overal bed mobility: Independent                Transfers Overall transfer level: Modified independent                  Ambulation/Gait Ambulation/Gait assistance: Supervision Ambulation Distance (Feet): 400 Feet Assistive device: None Gait Pattern/deviations: Step-through pattern Gait velocity: can vary Gait velocity interpretation: at or above normal speed for age/gender General Gait Details: mild instability with L head turns   Stairs Stairs: Yes Stairs assistance: Supervision Stair Management: No rails;Alternating pattern;Forwards Number of Stairs: 5 General stair comments: no concerns or issues  Wheelchair Mobility    Modified Rankin (Stroke Patients  Only)       Balance Overall balance assessment: Needs assistance Sitting-balance support: Feet supported Sitting balance-Leahy Scale: Normal     Standing balance support: During functional activity;No upper extremity supported Standing balance-Leahy Scale: Good                   Standardized Balance Assessment Standardized Balance Assessment : Dynamic Gait Index   Dynamic Gait Index Level Surface: Normal Change in Gait Speed: Normal Gait with Horizontal Head Turns: Normal Gait with Vertical Head Turns: Normal Gait and Pivot Turn: Normal Step Over Obstacle: Normal Step Around Obstacles: Normal Steps: Normal Total Score: 24       Pertinent Vitals/Pain Pain Assessment: No/denies pain    Home Living Family/patient expects to be discharged to:: Private residence Living Arrangements: Alone Available Help at Discharge: Family;Available PRN/intermittently Type of Home: House Home Access: Stairs to enter Entrance Stairs-Rails: Right;Left Entrance Stairs-Number of Steps: 3 Home Layout: One level;Laundry or work area in [place]: Environmental consultant - 2 wheels;Shower seat - built in      Prior Function Level of Independence: Independent         Comments: drove     Journalist, newspaper        Extremity/Trunk Assessment   Upper Extremity Assessment Upper Extremity Assessment: Overall WFL for tasks assessed    Lower Extremity Assessment Lower Extremity Assessment: Overall WFL for tasks assessed    Cervical / Trunk Assessment Cervical / Trunk Assessment: Normal  Communication   Communication: No difficulties  Cognition Arousal/Alertness: Awake/alert Behavior During Therapy: WFL for tasks assessed/performed Overall Cognitive Status: Within Functional Limits for tasks assessed  General Comments      Exercises     Assessment/Plan    PT Assessment Patent does not need any further PT services   PT Problem List         PT Treatment Interventions      PT Goals (Current goals can be found in the Care Plan section)  Acute Rehab PT Goals Patient Stated Goal: to go home and be independent    Frequency     Barriers to discharge        Co-evaluation               AM-PAC PT "6 Clicks" Daily Activity  Outcome Measure Difficulty turning over in bed (including adjusting bedclothes, sheets and blankets)?: None Difficulty moving from lying on back to sitting on the side of the bed? : None Difficulty sitting down on and standing up from a chair with arms (e.g., wheelchair, bedside commode, etc,.)?: None Help needed moving to and from a bed to chair (including a wheelchair)?: None Help needed walking in hospital room?: None Help needed climbing 3-5 steps with a railing? : None 6 Click Score: 24    End of Session   Activity Tolerance: Patient tolerated treatment well Patient left: in bed;with call bell/phone within reach;with family/visitor present Nurse Communication: Mobility status PT Visit Diagnosis: Other symptoms and signs involving the nervous system (R29.898)    Time: 5146-0479 PT Time Calculation (min) (ACUTE ONLY): 26 min   Charges:   PT Evaluation $PT Eval Low Complexity: 1 Low PT Treatments $Gait Training: 8-22 mins   PT G Codes:        [place], PT, DPT [place] 10/25/2017, 3:59 PM

## 2017-10-25 NOTE — Care Management Obs Status (Signed)
[place] NOTIFICATION   Patient Details  Name: Katha Kuehne MRN: [id number] Date of Birth: 06/14/1941   Medicare Observation Status Notification Given:  Yes    Carles Collet, RN 10/25/2017, 4:06 PM

## 2017-10-25 NOTE — Care Management CC44 (Signed)
Condition Code 44 Documentation Completed  Patient Details  Name: Jessica Beck MRN: [id number] Date of Birth: September 27, 1940   Condition Code 44 given:  Yes Patient signature on Condition Code 44 notice:  Yes Documentation of 2 MD's agreement:  Yes Code 44 added to claim:  Yes    Carles Collet, RN 10/25/2017, 4:06 PM

## 2017-10-30 ENCOUNTER — Telehealth: Payer: Self-pay

## 2017-10-30 NOTE — Telephone Encounter (Signed)
Patient left message she would like to speak with PCP regarding Telmisartan. She was told to D/C this when she was recently discharged from hospital. This was to replace the Valsartan in the recall. She has not had it in about a week and is concerned not to be taking it.  Call back is [phone]. Danley Danker, RN [facility] RN)

## 2017-10-31 NOTE — Telephone Encounter (Signed)
Pt called nurse line very upset that no one has called her back about her BP medication. I spoke with Dr. Erin Hearing who reviewed her chart and agreed that her BP med was stopped because her BP was low during hospital stay. His recommendation was to d/c medication and take her BP at home between now and her appointment 11/11/17. I called patient back and told her Dr. Margaretha Sheffield recommendations at which pt got even more upset. She thinks she needs to be on her BP medication and that's hypertension is what caused the problem with her eye. I offered patient an appointment sooner on 11/05/17 with Dr. Vanetta Shawl which she declined. She then said if she "doesn't die before the 9th she will come to that appointment. I told her I would forward her concerns to MD since she was unhappy with our current recommendation at which point she said "whatever" and hung up. Will forward to MD.  Wallace Cullens, RN

## 2017-11-05 ENCOUNTER — Inpatient Hospital Stay: Payer: Medicare Other | Admitting: Family Medicine

## 2017-11-11 ENCOUNTER — Other Ambulatory Visit: Payer: Self-pay

## 2017-11-11 ENCOUNTER — Ambulatory Visit (INDEPENDENT_AMBULATORY_CARE_PROVIDER_SITE_OTHER): Payer: Medicare Other | Admitting: Family Medicine

## 2017-11-11 ENCOUNTER — Encounter: Payer: Self-pay | Admitting: Family Medicine

## 2017-11-11 VITALS — BP 164/94 | HR 77 | Temp 98.2°F | Ht 62.0 in | Wt 162.0 lb

## 2017-11-11 DIAGNOSIS — H532 Diplopia: Secondary | ICD-10-CM

## 2017-11-11 DIAGNOSIS — I10 Essential (primary) hypertension: Secondary | ICD-10-CM | POA: Diagnosis not present

## 2017-11-11 MED ORDER — TELMISARTAN-HCTZ 80-25 MG PO TABS: 1.0000 | ORAL_TABLET | Freq: Every day | ORAL | 0 refills | Status: DC

## 2017-11-11 NOTE — Assessment & Plan Note (Signed)
Improving. Will follow up on Ophthalmology consult on 4/29.

## 2017-11-11 NOTE — Progress Notes (Signed)
   Subjective:    Patient ID: Jessica Beck, female    DOB: 1941-05-21, 77 y.o.   MRN: [id number]   CC: Hospital follow up and hypertension  HPI: Patient is a 77 yo female who presents today to discuss hypertension and recent hospital admission for diplopia.  Diplopia: Patient was recently admitted to [facility] for diplopia on 3/22. Patient reports since discharge her vision has significantly improved. She has an appointment with ophthalmology on 4/29. She has seen on optometrist who recommended an ophthalmologist. She does not wear the eye patch anymore to correct her vision.  Hypertension: Patient reports she was asked to stop telmisartan on discharge from the hospital but was not told when to restart. Patient has been checking her BP at home and reports and it has been high. She continue to take her verapamil. Patient just bought a new bp machine. She reports she decided to restart the medications after calling multiple times the clinic to talk to someone about it. Patient would like to be switch from verapamil due to the side effect profile ( tinnitus, skin rash, etc). She was originally started on it for BP control and migraines.  Smoking status reviewed   ROS: all other systems were reviewed and are negative other than in the HPI   Past Medical History:  Diagnosis Date  . Hypertension   . IBS (irritable bowel syndrome)   . Migraine 03/07/2017    Past Surgical History:  Procedure Laterality Date  . LAPAROSCOPIC APPENDECTOMY N/A 09/14/2016   Procedure: APPENDECTOMY LAPAROSCOPIC;  Surgeon: Armandina Gemma, MD;  Location: [facility] ORS;  Service: General;  Laterality: N/A;    Past medical history, surgical, family, and social history reviewed and updated in the EMR as appropriate.  Objective:  BP (!) 164/94   Pulse 77   Temp 98.2 F (36.8 C) (Oral)   Ht 5\' 2"  (1.575 m)   Wt 162 lb (73.5 kg)   SpO2 95%   BMI 29.63 kg/m   Vitals and nursing note reviewed  General: NAD, pleasant, able to  participate in exam Cardiac: RRR, normal heart sounds, no murmurs. 2+ radial and PT pulses bilaterally Respiratory: CTAB, normal effort, No wheezes, rales or rhonchi Abdomen: soft, nontender, nondistended, no hepatic or splenomegaly, +BS Extremities: no edema or cyanosis. WWP. Skin: warm and dry, no rashes noted Neuro: alert and oriented x4, no focal deficits Psych: Normal affect and mood   Assessment & Plan:    HTN (hypertension) Patient BP today is 164/94. Patient reports that she has recently resumed her telmisartan in addition to taking her verapamil. Patient would like to be taken off verapamil due to the side effect profile. Given continued uncontrolled BP despite tow oral agents, will make a few changes to her regimen. Will switch from verapamil to HCTZ. I will also increase telmisartan dosage to 80 mg daily. --Will start patient on combo pill telmisartan-HCTZ 80-25 mg once daily --Follow up in two weeks with PCP. --Patient know to discontinue new prescription if she start to experience dizziness and contact PCP for further adjustment.  Diplopia Improving. Will follow up on Ophthalmology consult on 4/29.    Marjie Skiff, MD [place] PGY-2

## 2017-11-11 NOTE — Patient Instructions (Signed)
It was great seeing you today! We have addressed the following issues today  1. I'm starting you on a new combo pills for your blood pressure called Micardis. If you notice some dizziness stop taking the drug and let me know. 2. You have a follow up appointment with Dr. Vanetta Shawl for blood pressure check. 3. Please make sure you make an appointment dr. Valentina Lucks to calibrate your BP machine.  If we did any lab work today, and the results require attention, either me or my nurse will get in touch with you. If everything is normal, you will get a letter in mail and a message via . If you don't hear from [place] in two weeks, please give [place] a call. Otherwise, we look forward to seeing you again at your next visit. If you have any questions or concerns before then, please call the clinic at [phone].  Please bring all your medications to every doctors visit  Sign up for My Chart to have easy access to your labs results, and communication with your Primary care physician. Please ask Front Desk for some assistance.   Please check-out at the front desk before leaving the clinic.    Take Care,   Dr. Andy Gauss

## 2017-11-11 NOTE — Assessment & Plan Note (Addendum)
Patient BP today is 164/94. Patient reports that she has recently resumed her telmisartan in addition to taking her verapamil. Patient would like to be taken off verapamil due to the side effect profile. Given continued uncontrolled BP despite tow oral agents, will make a few changes to her regimen. Will switch from verapamil to HCTZ. I will also increase telmisartan dosage to 80 mg daily. --Will start patient on combo pill telmisartan-HCTZ 80-25 mg once daily --Follow up in two weeks with PCP. --Patient know to discontinue new prescription if she start to experience dizziness and contact PCP for further adjustment.

## 2017-11-13 ENCOUNTER — Ambulatory Visit: Payer: Medicare Other | Admitting: Pharmacist

## 2017-11-28 ENCOUNTER — Other Ambulatory Visit: Payer: Self-pay

## 2017-11-28 ENCOUNTER — Encounter: Payer: Self-pay | Admitting: Family Medicine

## 2017-11-28 ENCOUNTER — Ambulatory Visit (INDEPENDENT_AMBULATORY_CARE_PROVIDER_SITE_OTHER): Payer: Medicare Other | Admitting: Family Medicine

## 2017-11-28 VITALS — BP 131/83 | HR 91 | Temp 98.3°F | Wt 162.0 lb

## 2017-11-28 DIAGNOSIS — I10 Essential (primary) hypertension: Secondary | ICD-10-CM

## 2017-11-28 DIAGNOSIS — M720 Palmar fascial fibromatosis [Dupuytren]: Secondary | ICD-10-CM

## 2017-11-28 DIAGNOSIS — M150 Primary generalized (osteo)arthritis: Secondary | ICD-10-CM

## 2017-11-28 DIAGNOSIS — M159 Polyosteoarthritis, unspecified: Secondary | ICD-10-CM

## 2017-11-28 MED ORDER — TRAMADOL HCL 50 MG PO TABS: ORAL_TABLET | ORAL | 3 refills | Status: DC

## 2017-11-28 MED ORDER — TELMISARTAN-HCTZ 80-25 MG PO TABS: 1.0000 | ORAL_TABLET | Freq: Every day | ORAL | 3 refills | Status: DC

## 2017-11-28 NOTE — Progress Notes (Signed)
    Subjective:    Patient ID: Jessica Beck, female    DOB: Feb 07, 1941, 77 y.o.   MRN: [id number]   CC: follow up BP  HPI: patient coming in to discuss blood pressure, she is on telmisartan-HCTZ and had recently stopped verapimil. She denies CP, SOB, LE, headaches, blurred vision  She reports she has experienced return of trigger finger and additionally has a very tender chord of tissue in her palm. She mentioned it at prior sports med visit but was told to keep an eye on it. It is getting worse, more bothersome to her, limiting function in her hand. She is open to more injection in her trigger finger. She is not interested in any operation.   She also wants to discuss her pain medication. Patient states she takes tramadol "for over 20 years without any issues" for her chronic knee OA. She is very upset her last prescription was for only 30 pills. She takes 1-3 pills a day, depending on the pain. Rarely she can go without any tramadol. She states she has never had an issue before getting a ninety day supply of tramadol in the past. She is extremely upset I "changed her medication without discussing it with her". She states she does not think tramadol is an opiate derivative and denies any danger to herself from taking it.   Smoking status reviewed- former smoker  Review of Systems- see HPI   Objective:  BP 131/83   Pulse 91   Temp 98.3 F (36.8 C) (Oral)   Wt 162 lb (73.5 kg)   SpO2 97%   BMI 29.63 kg/m  Vitals and nursing note reviewed  General: well nourished, in no acute distress Cardiac: RRR, clear S1 and S2, no murmurs, rubs, or gallops Respiratory: clear to auscultation bilaterally, no increased work of breathing Abdomen: soft, nontender, nondistended, no masses or organomegaly. Bowel sounds present Extremities: no edema or cyanosis. Skin: warm and dry, no rashes noted Neuro: alert and oriented, no focal deficits   Assessment & Plan:   1. 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 Discussed seeing hand specialist for injections or surgical intervention. Patient declined. She states she would like to see if it improves on her own. I cautioned her that this is best to treat early to prevent permanent disability.   2. Essential hypertension Stable at goal of <140/90, continue current medication - telmisartan-hydrochlorothiazide (MICARDIS HCT) 80-25 MG tablet; Take 1 tablet by mouth daily.  Dispense: 90 tablet; Refill: 3  3. Primary osteoarthritis involving multiple joints Refilled tramadol. Discussed dangers of tramadol in geriatric patients. Patient displeased. Will continue to bring up risks benefits of this medication at future visits.    Return in about 6 months (around 05/30/2018).   Lucila Maine, DO Family Medicine Resident PGY-2

## 2017-11-28 NOTE — Patient Instructions (Signed)
Dupuytren Contracture Dupuytren contracture is a condition in which tissue under the skin of the palm becomes abnormally thickened. This causes one or more of the fingers to curl inward (contract) toward the palm. Eventually, the fingers may not be able to straighten out. This condition affects some or all of the fingers and the palm of the hand. It is often passed along from parent to child (inherited). Dupuytren contracture is a long-term (chronic) condition that develops (progresses) slowly over time. There is no cure, but symptoms can be managed and progression can be slowed with treatment. This condition is usually not dangerous or painful, but it can interfere with everyday tasks. What are the causes? This condition is caused by tissue (fascia) in the palm getting thicker and tighter. When the fascia thickens, it pulls on the cords of tissue (tendons) that control finger movement. This causes the fingers to contract. The cause of fascia thickening is not known. What increases the risk? This condition may be more likely to develop in:  People who are age 34 or older.  Men.  People with a family history of this condition.  People who use tobacco products, including cigarettes, chewing tobacco, and e-cigarettes.  People who drink alcohol excessively.  What are the signs or symptoms? Symptoms may develop in one or both hands. Any of the fingers can contract. The fingers farthest from the thumb are commonly affected. Usually, this condition is painless. You may have discomfort when holding or grabbing objects. Early symptoms of this condition may include:  Thick, puckered skin on the hand.  One or more lumps (nodules) on the palm. Nodules may be tender when they first appear, but they are generally painless.  Symptoms of this condition develop slowly over months or years. Later symptoms of this condition may include:  Thick cords of tissue in the palm.  Fingers curled up toward the  palm.  Inability to straighten the fingers into their normal position.  How is this diagnosed? This condition is diagnosed with a physical exam, which may include:  Looking at your hands and feeling your hands. This is to check for thickened fascia and nodules.  Measuring finger motion.  Doing the [employer]. You may be asked to try to put your hand on a surface, with your palm down and your fingers straight out.  How is this treated? There is no cure for this condition, but treatment can make symptoms more manageable and relieve discomfort. Treatment options may include:  Physical therapy. This can strengthen your hand and increase flexibility.  Occupational therapy. This can help you with everyday tasks that may be more difficult because of your condition.  A hand splint.  Shots (injections). Substances may be injected into your hand, such as: ? Medicines that help to decrease swelling (corticosteroids). ? Proteins (collagenase) to weaken thick tissue. After a collagenase injection, your health care provider may stretch your fingers.  Needle aponeurotomy. In this procedure, a needle is pushed through the skin and into the fascia. Moving the needle against the fascia can weaken or break up the thick tissue.  Surgery. This may be needed if your condition causes discomfort or interferes with everyday activities. Physical therapy is usually needed after surgery.  In some cases, symptoms never develop to the point of needing major treatment, and caring for yourself at home can be enough to manage your condition. Symptoms often return after treatment. Follow these instructions at home: If you have a splint:  Do not put  pressure on any part of the splint until it is fully hardened. This may take several hours.  Wear the splint as told by your health care provider. Remove it only as told by your health care provider.  Loosen the splint if your fingers tingle, become numb,  or turn cold and blue.  Do not let your splint get wet if it is not waterproof. ? If your splint is not waterproof, cover it with a watertight covering when you take a bath or a shower. ? Do not take baths, swim, or use a hot tub until your health care provider approves. Ask your health care provider if you can take showers. You may only be allowed to take sponge baths for bathing.  Keep the splint clean.  Ask your health care provider when it is safe to drive. Hand Care  Take these actions to help protect your hand from possible injury: ? Use tools that have padded grips. ? Wear protective gloves while you work with your hands. ? Avoid repetitive hand movements.  Avoid actions that cause pain or discomfort.  Stretch your hand by gently pulling your fingers backward toward your wrist. Do this as often as is comfortable. Stop if this causes pain.  Gently massage your hand as often as is comfortable.  If directed, apply heat to the affected area as often as told by your health care provider. Use the heat source that your health care provider recommends, such as a moist heat pack or a heating pad. ? Place a towel between your skin and the heat source. ? Leave the heat on for 20-30 minutes. ? Remove the heat if your skin turns bright red. This is especially important if you are unable to feel pain, heat, or cold. You may have a greater risk of getting burned. General instructions  Take over-the-counter and prescription medicines only as told by your health care provider.  Manage any other conditions that you have, such as diabetes.  If physical therapy was prescribed, do exercises as told by your health care provider.  Keep all follow-up visits as told by your health care provider. This is important. Contact a health care provider if:  You develop new symptoms, or your symptoms get worse.  You have pain that gets worse or does not get better with medicine.  You have difficulty or  discomfort with everyday tasks.  You have problems with your splint.  You develop numbness or tingling. Get help right away if:  You have severe pain.  Your fingers change color or become unusually cold. This information is not intended to replace advice given to you by your health care provider. Make sure you discuss any questions you have with your health care provider. Document Released: 05/19/2009 Document Revised: 09/05/2015 Document Reviewed: 12/14/2014 Elsevier Interactive Patient Education  [employer].

## 2017-12-01 DIAGNOSIS — Z1231 Encounter for screening mammogram for malignant neoplasm of breast: Secondary | ICD-10-CM | POA: Diagnosis not present

## 2017-12-02 DIAGNOSIS — H5021 Vertical strabismus, right eye: Secondary | ICD-10-CM | POA: Diagnosis not present

## 2017-12-05 ENCOUNTER — Encounter: Payer: Self-pay | Admitting: Family Medicine

## 2017-12-15 ENCOUNTER — Other Ambulatory Visit: Payer: Self-pay

## 2017-12-15 ENCOUNTER — Encounter: Payer: Self-pay | Admitting: Internal Medicine

## 2017-12-15 ENCOUNTER — Other Ambulatory Visit: Payer: Self-pay | Admitting: Family Medicine

## 2017-12-15 ENCOUNTER — Ambulatory Visit (INDEPENDENT_AMBULATORY_CARE_PROVIDER_SITE_OTHER): Payer: Medicare Other | Admitting: Internal Medicine

## 2017-12-15 DIAGNOSIS — S70362A Insect bite (nonvenomous), left thigh, initial encounter: Secondary | ICD-10-CM

## 2017-12-15 DIAGNOSIS — W57XXXA Bitten or stung by nonvenomous insect and other nonvenomous arthropods, initial encounter: Secondary | ICD-10-CM | POA: Diagnosis not present

## 2017-12-15 MED ORDER — DOXYCYCLINE HYCLATE 50 MG PO TABS: 50.0000 mg | ORAL_TABLET | Freq: Two times a day (BID) | ORAL | 0 refills | Status: DC

## 2017-12-15 NOTE — Patient Instructions (Signed)
It was so nice to meet you!  We think you most likely are having an allergic reaction from the tick bite. Sometimes people can get an overlying bacterial infection when this happens. We will treat you with a course of Doxycycline. Please take this twice a day for 7 days.  If it get a lot more itchy or if you get more blisters, please come back to see [place]!  -Dr. Brett Albino

## 2017-12-15 NOTE — Progress Notes (Signed)
   [facility] Phone: [phone]  Subjective:  Jessica Beck is a 77 year old female presenting to clinic with a tick bite. She was walking her dog in a meadow 5 days ago. A couple of hours later, she noticed a tick on her left thigh. She used tweezers to remove the tick. The tick came off in 1 piece. She then noticed a red dot where the tick had been. She then started having some redness around the tick bite. The redness resolved on its own. The area then got very itchy and a bump/blister formed. She tried putting anti-itch cream on it, which helped. The blister has continued to get bigger. No fevers, no chills, no headaches, no joint pain, no muscle pain.  ROS: See HPI for pertinent positives and negatives  Past Medical History- HTN, hx stroke, IBS, HLD, osteoarthritis.  Family history reviewed for today's visit. No changes.  Social history- patient is a former smoker  Objective: BP 138/86 (BP Location: Right Arm, Patient Position: Sitting, Cuff Size: Normal)   Pulse 92   Temp 98.1 F (36.7 C) (Oral)   Ht 5\' 2"  (1.575 m)   Wt 164 lb (74.4 kg)   SpO2 94%   BMI 30.00 kg/m  Gen: NAD, alert, cooperative with exam Skin: Firm, fluid-filled 1cm x 1cm blister present on the anterior left thigh. Very mild rim of surrounding erythema. No induration. No tenderness to palpation.  Assessment/Plan: Tick Bite: Occurred 5 days ago. She does have fluid-filled lesion on her right anterior thigh that has the appearance of a blister but is very firm to the touch. Likely just a local allergic reaction, but it is unusual in appearance. Could also be bullous impetigo. Will treat with Doxycycline bid x 7 days to cover for both tick-bourne illness and skin infection. Advised patient to keep the area covered. Return precautions discussed. Follow-up if no improvement. Precepted with attending.   Hyman Bible, MD PGY-3

## 2017-12-16 DIAGNOSIS — W57XXXA Bitten or stung by nonvenomous insect and other nonvenomous arthropods, initial encounter: Secondary | ICD-10-CM | POA: Insufficient documentation

## 2017-12-16 NOTE — Assessment & Plan Note (Signed)
Occurred 5 days ago. She does have fluid-filled lesion on her right anterior thigh that has the appearance of a blister but is very firm to the touch. Likely just a local allergic reaction, but it is unusual in appearance. Could also be bullous impetigo. Will treat with Doxycycline bid x 7 days to cover for both tick-bourne illness and skin infection. Advised patient to keep the area covered. Return precautions discussed. Follow-up if no improvement. Precepted with attending.

## 2017-12-23 ENCOUNTER — Encounter: Payer: Self-pay | Admitting: Family Medicine

## 2018-01-13 DIAGNOSIS — M65341 Trigger finger, right ring finger: Secondary | ICD-10-CM | POA: Diagnosis not present

## 2018-01-15 DIAGNOSIS — Z96652 Presence of left artificial knee joint: Secondary | ICD-10-CM | POA: Diagnosis not present

## 2018-01-15 DIAGNOSIS — Z96641 Presence of right artificial hip joint: Secondary | ICD-10-CM | POA: Diagnosis not present

## 2018-01-15 DIAGNOSIS — M170 Bilateral primary osteoarthritis of knee: Secondary | ICD-10-CM | POA: Diagnosis not present

## 2018-01-15 DIAGNOSIS — Z96651 Presence of right artificial knee joint: Secondary | ICD-10-CM | POA: Diagnosis not present

## 2018-01-15 DIAGNOSIS — Z471 Aftercare following joint replacement surgery: Secondary | ICD-10-CM | POA: Diagnosis not present

## 2018-01-15 DIAGNOSIS — M1611 Unilateral primary osteoarthritis, right hip: Secondary | ICD-10-CM | POA: Diagnosis not present

## 2018-01-22 ENCOUNTER — Ambulatory Visit (INDEPENDENT_AMBULATORY_CARE_PROVIDER_SITE_OTHER): Payer: Medicare Other | Admitting: Podiatry

## 2018-01-22 ENCOUNTER — Ambulatory Visit (INDEPENDENT_AMBULATORY_CARE_PROVIDER_SITE_OTHER): Payer: Medicare Other

## 2018-01-22 ENCOUNTER — Encounter: Payer: Self-pay | Admitting: Podiatry

## 2018-01-22 VITALS — BP 130/79 | HR 82 | Resp 16

## 2018-01-22 DIAGNOSIS — M2041 Other hammer toe(s) (acquired), right foot: Secondary | ICD-10-CM | POA: Diagnosis not present

## 2018-01-22 DIAGNOSIS — M2042 Other hammer toe(s) (acquired), left foot: Secondary | ICD-10-CM | POA: Diagnosis not present

## 2018-01-22 DIAGNOSIS — M779 Enthesopathy, unspecified: Secondary | ICD-10-CM

## 2018-01-22 DIAGNOSIS — M150 Primary generalized (osteo)arthritis: Secondary | ICD-10-CM

## 2018-01-22 DIAGNOSIS — M159 Polyosteoarthritis, unspecified: Secondary | ICD-10-CM | POA: Insufficient documentation

## 2018-01-22 NOTE — Progress Notes (Signed)
   Subjective:    Patient ID: Jessica Beck, female    DOB: Oct 02, 1940, 77 y.o.   MRN: [id number]  HPI    Review of Systems  All other systems reviewed and are negative.      Objective:   Physical Exam        Assessment & Plan:

## 2018-01-22 NOTE — Patient Instructions (Signed)
Hammer Toe Hammer toe is a change in the shape (a deformity) of your second, third, or fourth toe. The deformity causes the middle joint of your toe to stay bent. This causes pain, especially when you are wearing shoes. Hammer toe starts gradually. At first, the toe can be straightened. Gradually over time, the deformity becomes stiff and permanent. Early treatments to keep the toe straight may relieve pain. As the deformity becomes stiff and permanent, surgery may be needed to straighten the toe. What are the causes? Hammer toe is caused by abnormal bending of the toe joint that is closest to your foot. It happens gradually over time. This pulls on the muscles and connections (tendons) of the toe joint, making them weak and stiff. It is often related to wearing shoes that are too short or narrow and do not let your toes straighten. What increases the risk? You may be at greater risk for hammer toe if you:  Are female.  Are older.  Wear shoes that are too small.  Wear high-heeled shoes that pinch your toes.  Are a ballet dancer.  Have a second toe that is longer than your big toe (first toe).  Injure your foot or toe.  Have arthritis.  Have a family history of hammer toe.  Have a nerve or muscle disorder.  What are the signs or symptoms? The main symptoms of this condition are pain and deformity of the toe. The pain is worse when wearing shoes, walking, or running. Other symptoms may include:  Corns or calluses over the bent part of the toe or between the toes.  Redness and a burning feeling on the toe.  An open sore that forms on the top of the toe.  Not being able to straighten the toe.  How is this diagnosed? This condition is diagnosed based on your symptoms and a physical exam. During the exam, your health care provider will try to straighten your toe to see how stiff the deformity is. You may also have tests, such as:  A blood test to check for rheumatoid  arthritis.  An X-ray to show how severe the deformity is.  How is this treated? Treatment for this condition will depend on how stiff the deformity is. Surgery is often needed. However, sometimes a hammer toe can be straightened without surgery. Treatments that do not involve surgery include:  Taping the toe into a straightened position.  Using pads and cushions to protect the toe (orthotics).  Wearing shoes that provide enough room for the toes.  Doing toe-stretching exercises at home.  Taking an NSAID to reduce pain and swelling.  If these treatments do not help or the toe cannot be straightened, surgery is the next option. The most common surgeries used to straighten a hammer toe include:  Arthroplasty. In this procedure, part of the joint is removed, and that allows the toe to straighten.  Fusion. In this procedure, cartilage between the two bones of the joint is taken out and the bones are fused together into one longer bone.  Implantation. In this procedure, part of the bone is removed and replaced with an implant to let the toe move again.  Flexor tendon transfer. In this procedure, the tendons that curl the toes down (flexor tendons) are repositioned.  Follow these instructions at home:  Take over-the-counter and prescription medicines only as told by your health care provider.  Do toe straightening and stretching exercises as told by your health care provider.  Keep all   follow-up visits as told by your health care provider. This is important. How is this prevented?  Wear shoes that give your toes enough room and do not cause pain.  Do not wear high-heeled shoes. Contact a health care provider if:  Your pain gets worse.  Your toe becomes red or swollen.  You develop an open sore on your toe. This information is not intended to replace advice given to you by your health care provider. Make sure you discuss any questions you have with your health care  provider. Document Released: 07/19/2000 Document Revised: 02/09/2016 Document Reviewed: 11/15/2015 Elsevier Interactive Patient Education  2018 [employer]  

## 2018-01-22 NOTE — Progress Notes (Signed)
Subjective:   Patient ID: Jessica Beck, female   DOB: 77 y.o.   MRN: [id number]   HPI Patient presents stating she gets trouble with her second toe right foot and big toe and also has pain in the outside of the left foot with both areas bothering her quite a bit.  Patient states is been going on for around a year and the toe seems to moved over that time and patient does not smoke currently and likes to be active   Review of Systems  All other systems reviewed and are negative.       Objective:  Physical Exam  Constitutional: She appears well-developed and well-nourished.  Cardiovascular: Intact distal pulses.  Pulmonary/Chest: Effort normal.  Musculoskeletal: Normal range of motion.  Neurological: She is alert.  Skin: Skin is warm.  Nursing note and vitals reviewed.   Neurovascular status intact muscle strength adequate range of motion within normal limits with patient found to have pressure between the hallux and second toe right foot with lesion formation of a minimal nature and is noted to have inflammation pain on the outside left foot base of fifth metatarsal with fluid buildup but no indication of muscle instability.  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  Reappoint as symptoms indicate eventually she may require digital fusion  X-rays indicate there is elevation in medial dislocation second digit right with irritation around the base of the fifth metatarsal left

## 2018-01-26 ENCOUNTER — Ambulatory Visit: Payer: Medicare Other | Admitting: Orthotics

## 2018-01-26 DIAGNOSIS — M2042 Other hammer toe(s) (acquired), left foot: Secondary | ICD-10-CM

## 2018-01-26 DIAGNOSIS — M79676 Pain in unspecified toe(s): Secondary | ICD-10-CM

## 2018-01-26 DIAGNOSIS — M2041 Other hammer toe(s) (acquired), right foot: Secondary | ICD-10-CM

## 2018-01-26 NOTE — Progress Notes (Signed)
Patient chose to refurbish existiing foot orthotics due to financial considerations at the time.

## 2018-03-19 ENCOUNTER — Other Ambulatory Visit: Payer: Self-pay | Admitting: Family Medicine

## 2018-05-04 DIAGNOSIS — L821 Other seborrheic keratosis: Secondary | ICD-10-CM | POA: Diagnosis not present

## 2018-05-04 DIAGNOSIS — D0439 Carcinoma in situ of skin of other parts of face: Secondary | ICD-10-CM | POA: Diagnosis not present

## 2018-05-07 ENCOUNTER — Ambulatory Visit (HOSPITAL_COMMUNITY)
Admission: EM | Admit: 2018-05-07 | Discharge: 2018-05-07 | Disposition: A | Discharge status: Home / Self Care | Payer: Medicare Other | Attending: Family Medicine | Admitting: Family Medicine

## 2018-05-07 ENCOUNTER — Encounter (HOSPITAL_COMMUNITY): Payer: Self-pay | Admitting: Emergency Medicine

## 2018-05-07 DIAGNOSIS — T41205A Adverse effect of unspecified general anesthetics, initial encounter: Secondary | ICD-10-CM

## 2018-05-07 DIAGNOSIS — H5789 Other specified disorders of eye and adnexa: Secondary | ICD-10-CM | POA: Diagnosis not present

## 2018-05-07 DIAGNOSIS — T7840XA Allergy, unspecified, initial encounter: Secondary | ICD-10-CM

## 2018-05-07 MED ORDER — PREDNISONE 20 MG PO TABS: ORAL_TABLET | ORAL | 0 refills | Status: DC

## 2018-05-07 NOTE — ED Provider Notes (Signed)
[place]    CSN: [id number] Arrival date & time: 05/07/18  3086     History   Chief Complaint Chief Complaint  Patient presents with  . Allergic Reaction    HPI Jessica Beck is a 77 y.o. female.   Pt sts possible allergic reaction with swelling to forehead and under eyes after having biopsy on Monday.  The allergic reaction began yesterday with swelling under her eyes.  Patient notes that she always has difficulty getting good anesthesia at the dentist.  Likewise, she had poor analgesia during the biopsy and cauterization on Monday.  Patient's never had this kind of reaction before.  She did receive quite a bit of anesthetic fluid during the Monday procedure.  Patient has no skin itchiness, respiratory difficulties.  She has had some increasing nasal congestion which she was not sure might be allergies at this time of year or reaction as well from the anesthetic.     Past Medical History:  Diagnosis Date  . Hypertension   . IBS (irritable bowel syndrome)   . Migraine 03/07/2017    Patient Active Problem List   Diagnosis Date Noted  . 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 Tick bite 12/16/2017  . 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 Diplopia 10/24/2017  . Pain in joint, shoulder region 07/25/2017  . Trigger finger, acquired 07/04/2017  . Hammer toe of left foot 06/20/2017  . Migraine 03/07/2017  . IBS (irritable bowel syndrome) 03/07/2017  . HTN (hypertension) 03/07/2017  . Osteoarthritis 03/07/2017  . Mixed hyperlipidemia 03/07/2017  . Malnutrition of moderate degree 09/19/2016  . Acute gangrenous perforated appendicitis s/p lap appendectomy 09/14/2016 09/14/2016  . Statin intolerance 08/30/2016  . On statin therapy due to risk of future cardiovascular event 11/20/2015  . Gastroesophageal reflux disease without esophagitis 08/18/2015  . Seasonal allergic rhinitis due to pollen 08/18/2015    Past Surgical History:    Procedure Laterality Date  . LAPAROSCOPIC APPENDECTOMY N/A 09/14/2016   Procedure: APPENDECTOMY LAPAROSCOPIC;  Surgeon: Armandina Gemma, MD;  Location: [facility];  Service: General;  Laterality: N/A;    OB History   None      Home Medications    Prior to Admission medications   Medication Sig Start Date End Date Taking? Authorizing Provider  aspirin EC 81 MG tablet Take 81 mg by mouth every evening.     [provider]  Cholecalciferol (VITAMIN D3) 2000 units TABS Resume this medication next week when you are feeling better. 09/20/16   Earnstine Regal, PA-C  co-enzyme Q-10 30 MG capsule Resume this medication next week when you're feeling better. Patient taking differently: Take 30 mg by mouth at bedtime.  09/20/16   Earnstine Regal, PA-C  Doxycycline Hyclate 50 MG TABS Take 50 mg by mouth 2 (two) times daily. 12/15/17   Mayo, Pete Pelt, MD  magnesium oxide (MAG-OX) 400 MG tablet Take 400 mg by mouth every evening.     [provider]  meloxicam (MOBIC) 15 MG tablet Take 15 mg by mouth daily. 08/29/16   [provider]  Multiple Vitamins-Minerals (ICAPS AREDS 2 PO) Take 1 capsule by mouth daily.    [provider]  Omega 3 1000 MG CAPS Resume this medication next week when you're feeling better. Patient taking differently: Take 1 capsule by mouth 2 (two) times daily.  09/20/16   Earnstine Regal, PA-C  omeprazole (PRILOSEC) 20 MG capsule TAKE (1) CAPSULE DAILY. 03/20/18   Steve Rattler, DO  Polyethyl  Glycol-Propyl Glycol (SYSTANE) 0.4-0.3 % SOLN Place 1 drop into both eyes daily as needed (dry eyes).     [provider]  predniSONE (DELTASONE) 20 MG tablet Two daily with food 05/07/18   Robyn Haber, MD  PROAIR HFA 108 [phone] Base) MCG/ACT inhaler Take 2 puffs by mouth daily as needed for wheezing or shortness of breath.  06/20/16   [provider]  Probiotic Product (ALIGN PO) Take 1 capsule by mouth daily.    [provider]   telmisartan-hydrochlorothiazide (MICARDIS HCT) 80-25 MG tablet Take 1 tablet by mouth daily. 11/28/17   Steve Rattler, DO  traMADol (ULTRAM) 50 MG tablet TAKE 1 TABLET PO 3 TIMES A DAY AS NEEDED PAIN 11/28/17   Lucila Maine C, DO  triamcinolone cream (KENALOG) 0.1 % Apply 1 application daily as needed topically. Patient taking differently: Apply 1 application topically daily as needed (rash).  06/20/17   Steve Rattler, DO  VITAMIN E PO Take 1 tablet by mouth every evening.    [provider]    Family History History reviewed. No pertinent family history.  Social History Social History   Tobacco Use  . Smoking status: Former Smoker    Last attempt to quit: 08/05/1974    Years since quitting: 43.7  . Smokeless tobacco: Never Used  Substance Use Topics  . Alcohol use: Yes  . Drug use: Never     Allergies   Oxycodone hcl; Flexeril [cyclobenzaprine]; Latex; Oxycodone; and Sulfa antibiotics   Review of Systems Review of Systems  Cardiovascular: Negative.   Gastrointestinal: Negative.   Skin: Positive for rash.  All other systems reviewed and are negative.    Physical Exam Triage Vital Signs ED Triage Vitals [05/07/18 1022]  Enc Vitals Group     BP (!) 174/82     Pulse Rate 87     Resp 18     Temp 98.6 F (37 C)     Temp Source Oral     SpO2 98 %     Weight      Height      Head Circumference      Peak Flow      Pain Score      Pain Loc      Pain Edu?      Excl. in [place]?    No data found.  Updated Vital Signs BP (!) 174/82 (BP Location: Left Arm)   Pulse 87   Temp 98.6 F (37 C) (Oral)   Resp 18   SpO2 98%    Physical Exam  Constitutional: She is oriented to person, place, and time. She appears well-developed and well-nourished.  HENT:  Right Ear: External ear normal.  Left Ear: External ear normal.  Mouth/Throat: Oropharynx is clear and moist.  Eyes: Pupils are equal, round, and reactive to light. Conjunctivae are normal.  Neck:  Normal range of motion. Neck supple.  Pulmonary/Chest: Effort normal.  Musculoskeletal: Normal range of motion.  Neurological: She is alert and oriented to person, place, and time.  Skin: Skin is warm and dry.  Psychiatric: She has a normal mood and affect.  Nursing note and vitals reviewed.       UC Treatments / Results  Labs (all labs ordered are listed, but only abnormal results are displayed) Labs Reviewed - No data to display  EKG None  Radiology No results found.  Procedures Procedures (including critical care time)  Medications Ordered in UC Medications - No data to display  Initial Impression / Assessment and Plan / UC Course  I have reviewed the triage vital signs and the nursing notes.  Pertinent labs & imaging results that were available during my care of the patient were reviewed by me and considered in my medical decision making (see chart for details).    Final Clinical Impressions(s) / UC Diagnoses   Final diagnoses:  Allergic reaction, initial encounter     Discharge Instructions     The fact that you do not get good anesthesia with dentistry as well as this biopsy procedure suggest that you do not respond well to the usual anesthetics.  You need to make your dental care provider know this as well as her dermatologist.  I believe you have had a delayed hypersensitivity reaction to the anesthetic that is causing the swelling under your eyes.  This should gradually resolve over the next 48 to 72 hours.  He should not use that type of anesthetic again and you can find out what your dermatologist used when you see her in follow-up.    ED Prescriptions    Medication Sig Dispense Auth. Provider   predniSONE (DELTASONE) 20 MG tablet Two daily with food 4 tablet Robyn Haber, MD     Controlled Substance Prescriptions [place] Controlled Substance Registry consulted? Not Applicable   Robyn Haber, MD 05/07/18 1047

## 2018-05-07 NOTE — Discharge Instructions (Addendum)
The fact that you do not get good anesthesia with dentistry as well as this biopsy procedure suggest that you do not respond well to the usual anesthetics.  You need to make your dental care provider know this as well as her dermatologist.  I believe you have had a delayed hypersensitivity reaction to the anesthetic that is causing the swelling under your eyes.  This should gradually resolve over the next 48 to 72 hours.  He should not use that type of anesthetic again and you can find out what your dermatologist used when you see her in follow-up.

## 2018-05-07 NOTE — ED Triage Notes (Signed)
Pt sts possible allergic reaction with swelling to forehead and under eyes after having biopsy on Tuesday

## 2018-05-13 ENCOUNTER — Ambulatory Visit (INDEPENDENT_AMBULATORY_CARE_PROVIDER_SITE_OTHER): Payer: Medicare Other

## 2018-05-13 DIAGNOSIS — Z23 Encounter for immunization: Secondary | ICD-10-CM

## 2018-05-25 DIAGNOSIS — D0439 Carcinoma in situ of skin of other parts of face: Secondary | ICD-10-CM | POA: Diagnosis not present

## 2018-06-22 ENCOUNTER — Other Ambulatory Visit: Payer: Self-pay

## 2018-06-22 ENCOUNTER — Ambulatory Visit (INDEPENDENT_AMBULATORY_CARE_PROVIDER_SITE_OTHER): Payer: Medicare Other | Admitting: Family Medicine

## 2018-06-22 VITALS — BP 134/70 | HR 84 | Temp 97.8°F | Ht 62.0 in | Wt 165.0 lb

## 2018-06-22 DIAGNOSIS — K582 Mixed irritable bowel syndrome: Secondary | ICD-10-CM

## 2018-06-22 DIAGNOSIS — R300 Dysuria: Secondary | ICD-10-CM | POA: Insufficient documentation

## 2018-06-22 LAB — POCT UA - MICROSCOPIC ONLY

## 2018-06-22 LAB — POCT URINALYSIS DIP (MANUAL ENTRY)
BILIRUBIN UA: NEGATIVE
BILIRUBIN UA: NEGATIVE mg/dL
Glucose, UA: NEGATIVE mg/dL
Nitrite, UA: NEGATIVE
PH UA: 7 (ref 5.0–8.0)
Protein Ur, POC: NEGATIVE mg/dL
SPEC GRAV UA: 1.01 (ref 1.010–1.025)
Urobilinogen, UA: 0.2 E.U./dL

## 2018-06-22 MED ORDER — NITROFURANTOIN MONOHYD MACRO 100 MG PO CAPS
100.0000 mg | ORAL_CAPSULE | Freq: Two times a day (BID) | ORAL | 0 refills | Status: DC
Start: 2018-06-22 — End: 2018-08-06

## 2018-06-22 NOTE — Assessment & Plan Note (Signed)
  Primarily diarrhea, flaring up now. She plans to follow up with GI which I think is appropriate. She feels her align probiotic is not effective anymore. Suggested Kefir and other fermented foods to try to see if theres an improvement.

## 2018-06-22 NOTE — Patient Instructions (Signed)
  Good to see you! Please take the macrobid twice a day for 5 days I will let you know the culture results and if we need to change treatment.  If you have flank pain or fevers/chills please let me know.  If you have questions or concerns please do not hesitate to call at [phone].  Lucila Maine, DO PGY-3, [facility] 06/22/2018 10:40 AM   Acute Urinary Retention, Female Urinary retention means you are unable to pee completely or at all (empty your bladder). Follow these instructions at home:  Drink enough fluids to keep your pee (urine) clear or pale yellow.  If you are sent home with a tube that drains the bladder (catheter), there will be a drainage bag attached to it. There are two types of bags. One is big that you can wear at night without having to empty it. One is smaller and needs to be emptied more often. ? Keep the drainage bag emptied. ? Keep the drainage bag lower than the tube.  Only take medicine as told by your doctor. Contact a doctor if:  You have a low-grade fever.  You have spasms or you are leaking pee when you have spasms. Get help right away if:  You have chills or a fever.  Your catheter stops draining pee.  Your catheter falls out.  You have increased bleeding that does not stop after you have rested and increased the amount of fluids you had been drinking. This information is not intended to replace advice given to you by your health care provider. Make sure you discuss any questions you have with your health care provider. Document Released: 01/08/2008 Document Revised: 12/28/2015 Document Reviewed: 12/31/2012 Elsevier Interactive Patient Education  2017 [employer].

## 2018-06-22 NOTE — Assessment & Plan Note (Signed)
  UA w/ trace blood and leuks. Awaiting micro. Will send for culture. rx for 5 day course of macrobid given. Will call patient if culture results indicate another antibiotic is necessary. Reasons to return to be seen reviewed including flank pain, fevers, chills. Patient verbalized understanding and agreement with plan.

## 2018-06-22 NOTE — Progress Notes (Signed)
    Subjective:    Patient ID: Jessica Beck, female    DOB: 1941/05/10, 77 y.o.   MRN: [id number]   CC: UTI symptoms  HPI:  10 days of burning with urination and frequent urination. Drinking a lot of water to flush out but no improvement in symptoms. Saw blood in her urine once which has happened in the past with UTIs. She was hoping it would resolve on its own but since it has not sought care.  On align probiotics for IBS with mixed constipation and diarrhea. IBS has been flaring up with bad diarrhea which she thinks is what caused the UTI. She plans to follow up with her GI to discuss this.  Smoking status reviewed- former smoker  Review of Systems-No fevers, chills, suprapubic pain, nausea, vomiting, flank pain   Objective:  BP 134/70   Pulse 84   Temp 97.8 F (36.6 C) (Other (Comment))   Ht 5\' 2"  (1.575 m)   Wt 165 lb (74.8 kg)   SpO2 97%   BMI 30.18 kg/m  Vitals and nursing note reviewed  General: well appearing, well nourished, in no acute distress HEENT: normocephalic, MMM Cardiac: regular rate Respiratory: no increased work of breathing Abdomen: soft, nontender, no CVA tenderness Neuro: alert and oriented, no focal deficits   Assessment & Plan:    Dysuria  UA w/ trace blood and leuks. Awaiting micro. Will send for culture. rx for 5 day course of macrobid given. Will call patient if culture results indicate another antibiotic is necessary. Reasons to return to be seen reviewed including flank pain, fevers, chills. Patient verbalized understanding and agreement with plan.   IBS (irritable bowel syndrome)  Primarily diarrhea, flaring up now. She plans to follow up with GI which I think is appropriate. She feels her align probiotic is not effective anymore. Suggested Kefir and other fermented foods to try to see if theres an improvement.    Return if symptoms worsen or fail to improve.   Lucila Maine, DO Family Medicine Resident PGY-3

## 2018-07-01 ENCOUNTER — Other Ambulatory Visit: Payer: Self-pay | Admitting: Family Medicine

## 2018-07-06 DIAGNOSIS — Z85828 Personal history of other malignant neoplasm of skin: Secondary | ICD-10-CM | POA: Diagnosis not present

## 2018-07-06 DIAGNOSIS — Z08 Encounter for follow-up examination after completed treatment for malignant neoplasm: Secondary | ICD-10-CM | POA: Diagnosis not present

## 2018-08-06 ENCOUNTER — Encounter (HOSPITAL_COMMUNITY): Payer: Self-pay | Admitting: Emergency Medicine

## 2018-08-06 ENCOUNTER — Other Ambulatory Visit: Payer: Self-pay

## 2018-08-06 ENCOUNTER — Ambulatory Visit (HOSPITAL_COMMUNITY)
Admission: EM | Admit: 2018-08-06 | Discharge: 2018-08-06 | Disposition: A | Discharge status: Home / Self Care | Payer: Medicare Other | Attending: Family Medicine | Admitting: Family Medicine

## 2018-08-06 DIAGNOSIS — J22 Unspecified acute lower respiratory infection: Secondary | ICD-10-CM | POA: Insufficient documentation

## 2018-08-06 MED ORDER — FLUTICASONE PROPIONATE 50 MCG/ACT NA SUSP: 2.0000 | Freq: Every day | NASAL | 0 refills | Status: DC

## 2018-08-06 MED ORDER — HYDROCOD POLST-CPM POLST ER 10-8 MG/5ML PO SUER: 5.0000 mL | Freq: Every evening | ORAL | 0 refills | Status: DC | PRN

## 2018-08-06 MED ORDER — AMOXICILLIN-POT CLAVULANATE 875-125 MG PO TABS: 1.0000 | ORAL_TABLET | Freq: Two times a day (BID) | ORAL | 0 refills | Status: DC

## 2018-08-06 NOTE — ED Triage Notes (Signed)
PT reports congestion, drainage, head discomfort, chest congestion. This has gone on for over a week.

## 2018-08-06 NOTE — ED Provider Notes (Signed)
Jessica Beck    CSN: [id number] Arrival date & time: 08/06/18  1605     History   Chief Complaint Chief Complaint  Patient presents with  . URI    HPI Jessica Beck is a 78 y.o. female.   HPI  Patient has 8 days of cold symptoms.  Started off as a head cold.  Then she started coughing.  Her cough is getting worse.  Now she is very fatigued.  She is coughing frequently and coughing up a lot of sputum.  She had fever initially but thinks this is resolved.  At times she feels short of breath.  No chest pain with deep breathing with coughing.  She is continuing to have sinus congestion and pressure.  She has postnasal drip that she describes as "nasty and brown" also some nasal bleeding.  She has been using over-the-counter medications.  She is not seeing improvement, so is here for evaluation.  Past Medical History:  Diagnosis Date  . Hypertension   . IBS (irritable bowel syndrome)   . Migraine 03/07/2017    Patient Active Problem List   Diagnosis Date Noted  . Dysuria 06/22/2018  . 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 Tick bite 12/16/2017  . 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 Diplopia 10/24/2017  . Pain in joint, shoulder region 07/25/2017  . Trigger finger, acquired 07/04/2017  . Hammer toe of left foot 06/20/2017  . Migraine 03/07/2017  . IBS (irritable bowel syndrome) 03/07/2017  . HTN (hypertension) 03/07/2017  . Osteoarthritis 03/07/2017  . Mixed hyperlipidemia 03/07/2017  . Malnutrition of moderate degree 09/19/2016  . Acute gangrenous perforated appendicitis s/p lap appendectomy 09/14/2016 09/14/2016  . Statin intolerance 08/30/2016  . On statin therapy due to risk of future cardiovascular event 11/20/2015  . Gastroesophageal reflux disease without esophagitis 08/18/2015  . Seasonal allergic rhinitis due to pollen 08/18/2015    Past Surgical History:  Procedure Laterality Date  . LAPAROSCOPIC APPENDECTOMY N/A 09/14/2016     Procedure: APPENDECTOMY LAPAROSCOPIC;  Surgeon: Armandina Gemma, MD;  Location: [facility];  Service: General;  Laterality: N/A;    OB History   No obstetric history on file.      Home Medications    Prior to Admission medications   Medication Sig Start Date End Date Taking? Authorizing Provider  aspirin EC 81 MG tablet Take 81 mg by mouth every evening.    Yes [provider]  magnesium oxide (MAG-OX) 400 MG tablet Take 400 mg by mouth every evening.    Yes [provider]  meloxicam (MOBIC) 15 MG tablet Take 15 mg by mouth daily. 08/29/16  Yes [provider]  omeprazole (PRILOSEC) 20 MG capsule TAKE (1) CAPSULE DAILY. 07/01/18  Yes Riccio, Gardiner Rhyme, DO  Polyethyl Glycol-Propyl Glycol (SYSTANE) 0.4-0.3 % SOLN Place 1 drop into both eyes daily as needed (dry eyes).    Yes [provider]  amoxicillin-clavulanate (AUGMENTIN) 875-125 MG tablet Take 1 tablet by mouth every 12 (twelve) hours. 08/06/18   Raylene Everts, MD  chlorpheniramine-HYDROcodone [facility] PENNKINETIC ER) 10-8 MG/5ML SUER Take 5 mLs by mouth at bedtime as needed for cough. 08/06/18   Raylene Everts, MD  Cholecalciferol (VITAMIN D3) 2000 units TABS Resume this medication next week when you are feeling better. 09/20/16   Earnstine Regal, PA-C  co-enzyme Q-10 30 MG capsule Resume this medication next week when you're feeling better. Patient taking differently: Take 30 mg by mouth at bedtime.  09/20/16  Earnstine Regal, PA-C  fluticasone [facility]) 50 MCG/ACT nasal spray Place 2 sprays into both nostrils daily. 08/06/18   Raylene Everts, MD  Multiple Vitamins-Minerals (ICAPS AREDS 2 PO) Take 1 capsule by mouth daily.    [provider]  Omega 3 1000 MG CAPS Resume this medication next week when you're feeling better. Patient taking differently: Take 1 capsule by mouth 2 (two) times daily.  09/20/16   Earnstine Regal, PA-C  PROAIR HFA 108 [phone] Base) MCG/ACT inhaler Take 2 puffs  by mouth daily as needed for wheezing or shortness of breath.  06/20/16   [provider]  Probiotic Product (ALIGN PO) Take 1 capsule by mouth daily.    [provider]  telmisartan-hydrochlorothiazide (MICARDIS HCT) 80-25 MG tablet Take 1 tablet by mouth daily. 11/28/17   Steve Rattler, DO  traMADol (ULTRAM) 50 MG tablet TAKE 1 TABLET PO 3 TIMES A DAY AS NEEDED PAIN 11/28/17   Lucila Maine C, DO  triamcinolone cream (KENALOG) 0.1 % Apply 1 application daily as needed topically. Patient taking differently: Apply 1 application topically daily as needed (rash).  06/20/17   Steve Rattler, DO  VITAMIN E PO Take 1 tablet by mouth every evening.    [provider]    Family History No family history on file.  Social History Social History   Tobacco Use  . Smoking status: Former Smoker    Last attempt to quit: 08/05/1974    Years since quitting: 44.0  . Smokeless tobacco: Never Used  Substance Use Topics  . Alcohol use: Yes  . Drug use: Never     Allergies   Oxycodone hcl; Flexeril [cyclobenzaprine]; Latex; Oxycodone; and Sulfa antibiotics   Review of Systems Review of Systems  Constitutional: Negative for chills and fever.  HENT: Positive for congestion, nosebleeds, postnasal drip, rhinorrhea and sinus pressure. Negative for ear pain and sore throat.   Eyes: Negative for pain and visual disturbance.  Respiratory: Positive for cough. Negative for shortness of breath.   Cardiovascular: Negative for chest pain and palpitations.  Gastrointestinal: Negative for abdominal pain and vomiting.  Genitourinary: Negative for dysuria and hematuria.  Musculoskeletal: Negative for arthralgias and back pain.  Skin: Negative for color change and rash.  Neurological: Negative for seizures and syncope.  All other systems reviewed and are negative.    Physical Exam Triage Vital Signs ED Triage Vitals  Enc Vitals Group     BP 08/06/18 1657 (!) 156/93     Pulse  Rate 08/06/18 1655 78     Resp 08/06/18 1655 16     Temp 08/06/18 1655 98.5 F (36.9 C)     Temp Source 08/06/18 1655 Oral     SpO2 08/06/18 1655 96 %     Weight --      Height --      Head Circumference --      Peak Flow --      Pain Score 08/06/18 1656 2     Pain Loc --      Pain Edu? --      Excl. in [place]? --    No data found.  Updated Vital Signs BP (!) 156/93   Pulse 78   Temp 98.5 F (36.9 C) (Oral)   Resp 16   SpO2 96%    Physical Exam Constitutional:      General: She is not in acute distress.    Appearance: She is well-developed. She is ill-appearing.  Comments: Appears tired.  Moderately ill  HENT:     Head: Normocephalic and atraumatic.     Right Ear: Tympanic membrane and ear canal normal.     Left Ear: Tympanic membrane and ear canal normal.     Nose: Congestion present.     Mouth/Throat:     Mouth: Mucous membranes are moist.     Comments: Mild posterior pharyngeal erythema Eyes:     Conjunctiva/sclera: Conjunctivae normal.     Pupils: Pupils are equal, round, and reactive to light.  Neck:     Musculoskeletal: Normal range of motion and neck supple.  Cardiovascular:     Rate and Rhythm: Normal rate and regular rhythm.     Heart sounds: Normal heart sounds.  Pulmonary:     Effort: Pulmonary effort is normal. No respiratory distress.     Comments: Patient has rales in left base, scattered rhonchi Abdominal:     General: There is no distension.     Palpations: Abdomen is soft.  Musculoskeletal: Normal range of motion.  Lymphadenopathy:     Cervical: No cervical adenopathy.  Skin:    General: Skin is warm and dry.  Neurological:     General: No focal deficit present.     Mental Status: She is alert. Mental status is at baseline.  Psychiatric:        Mood and Affect: Mood normal.        Thought Content: Thought content normal.      UC Treatments / Results  Labs (all labs ordered are listed, but only abnormal results are displayed) Labs  Reviewed - No data to display  EKG None  Radiology No results found.  Procedures Procedures (including critical care time)  Medications Ordered in UC Medications - No data to display  Initial Impression / Assessment and Plan / UC Course  I have reviewed the triage vital signs and the nursing notes.  Pertinent labs & imaging results that were available during my care of the patient were reviewed by me and considered in my medical decision making (see chart for details).     Discussed that she had an infection that likely needed antibiotics after 8 days of illness, and worsening symptoms.  Her lung exam suggests early pneumonia.  She chooses not to have a chest x-ray, since I am treating her with Augmentin for sinus infection would not change management.  She is cautioned to go to ER if she is worse instead of better Final Clinical Impressions(s) / UC Diagnoses   Final diagnoses:  Lower respiratory infection (e.g., bronchitis, pneumonia, pneumonitis, pulmonitis)     Discharge Instructions     Get plenty of rest Push fluids Take an expectorant for the thick mucous Take the antibiotic 2 x a day Take the strong cough medicine at night Use the flonase 2 x a day until symptoms resolve Return if you fail to see improvement in a few days   ED Prescriptions    Medication Sig Dispense Auth. Provider   amoxicillin-clavulanate (AUGMENTIN) 875-125 MG tablet Take 1 tablet by mouth every 12 (twelve) hours. 14 tablet Raylene Everts, MD   chlorpheniramine-HYDROcodone [facility]) 10-8 MG/5ML SUER Take 5 mLs by mouth at bedtime as needed for cough. 70 mL Raylene Everts, MD   fluticasone [facility]) 50 MCG/ACT nasal spray Place 2 sprays into both nostrils daily. 16 g Raylene Everts, MD     Controlled Substance Prescriptions [place] Controlled Substance Registry consulted? Yes   Meda Coffee,  Jennette Banker, MD 08/06/18 [phone]

## 2018-08-06 NOTE — Discharge Instructions (Addendum)
Get plenty of rest Push fluids Take an expectorant for the thick mucous Take the antibiotic 2 x a day Take the strong cough medicine at night Use the flonase 2 x a day until symptoms resolve Return if you fail to see improvement in a few days

## 2018-09-02 ENCOUNTER — Other Ambulatory Visit: Payer: Self-pay | Admitting: Family Medicine

## 2018-10-08 DIAGNOSIS — K219 Gastro-esophageal reflux disease without esophagitis: Secondary | ICD-10-CM | POA: Diagnosis not present

## 2018-10-08 DIAGNOSIS — K625 Hemorrhage of anus and rectum: Secondary | ICD-10-CM | POA: Diagnosis not present

## 2018-10-08 DIAGNOSIS — Z1211 Encounter for screening for malignant neoplasm of colon: Secondary | ICD-10-CM | POA: Diagnosis not present

## 2018-10-08 DIAGNOSIS — R194 Change in bowel habit: Secondary | ICD-10-CM | POA: Diagnosis not present

## 2018-12-15 ENCOUNTER — Other Ambulatory Visit: Payer: Self-pay | Admitting: Family Medicine

## 2018-12-15 DIAGNOSIS — I10 Essential (primary) hypertension: Secondary | ICD-10-CM

## 2019-04-11 IMAGING — MR MR MRA HEAD W/O CM
10 of 12 series · 32 of 48 positions shown · non-contrast
Comparison: Prior CT from 10/24/2017.

CLINICAL DATA: Initial evaluation for acute onset diplopia.

EXAM:
MRI HEAD WITHOUT CONTRAST
MRA HEAD WITHOUT CONTRAST
TECHNIQUE: Multiplanar, multiecho pulse sequences of the brain and surrounding
structures were obtained without intravenous contrast. Angiographic
images of the head were obtained using MRA technique without
contrast.

[Series 5001: ax dwi_tracew · axial · 3.0mm · 1.50mm/px · z∈[-35,+98]mm · 6 of 80 slices shown]
[im 1/80]
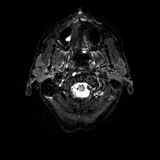
[im 16/80]
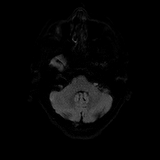
[im 32/80]
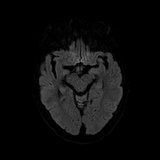
[im 48/80]
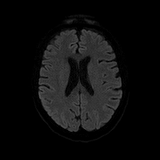
[im 64/80]
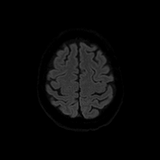
[im 80/80]
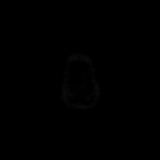

[Series 6001: ax dwi_adc · axial · 3.0mm · 1.50mm/px · z∈[-35,+98]mm · 3 of 40 slices shown]
[im 1/40]
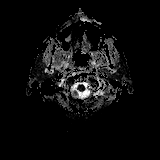
[im 20/40]
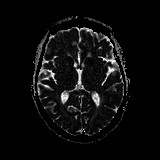
[im 40/40]
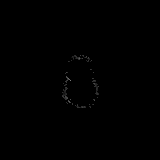

[Series 7001: cor dwi_tracew · coronal · 5.0mm · 1.44mm/px · 5 of 64 slices shown]
[im 1/64]
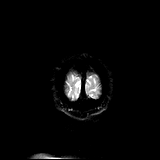
[im 16/64]
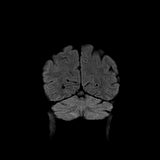
[im 32/64]
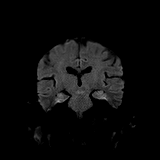
[im 48/64]
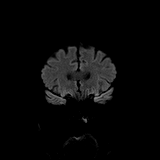
[im 64/64]
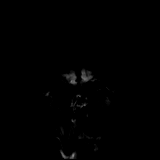

[Series 8001: cor dwi_adc · coronal · 5.0mm · 1.44mm/px · 2 of 32 slices shown]
[im 1/32]
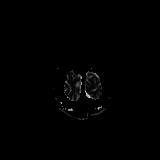
[im 32/32]
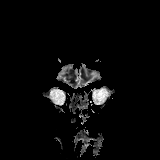

[T1 · sagittal · 5.0mm · 0.75mm/px · 2 of 23 slices shown]
[im 1/23]
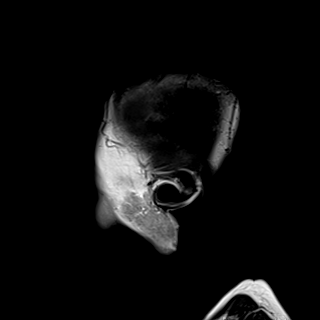
[im 23/23]
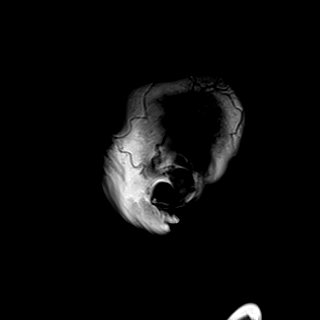

[T2 · axial · 5.0mm · 0.75mm/px · z∈[-42,+100]mm · 2 of 25 slices shown (1 of 2)]
[im 1/25]
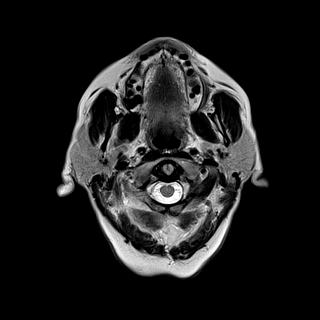
[im 25/25]
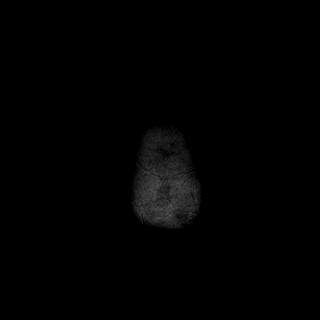

[FLAIR · axial · 5.0mm · 0.47mm/px · z∈[-41,+101]mm · 2 of 25 slices shown]
[im 1/25]
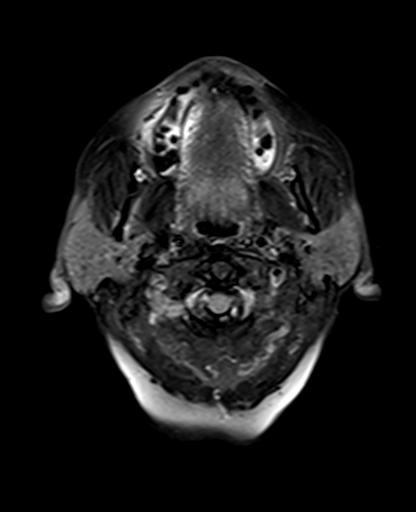
[im 25/25]
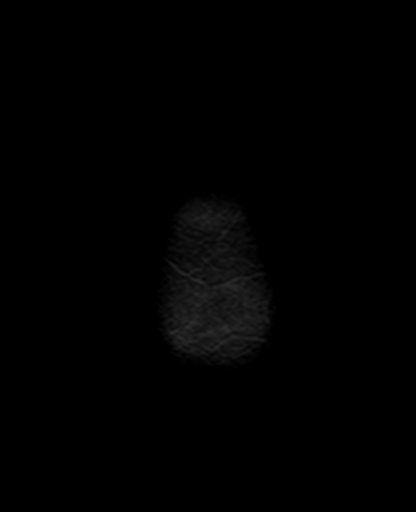

[swi_images · axial · 3.0mm · 0.94mm/px · z∈[-59,+116]mm · 4 of 60 slices shown]
[im 1/60]
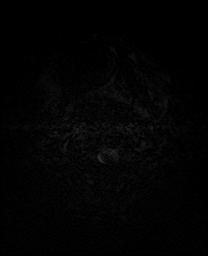
[im 20/60]
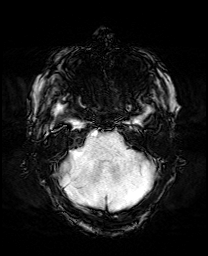
[im 40/60]
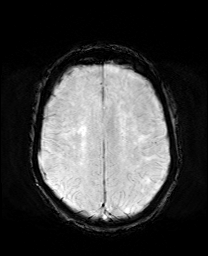
[im 60/60]
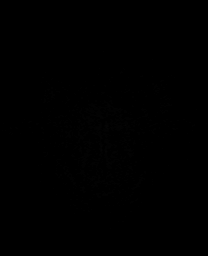

[mip_images(sw) · axial · 24.0mm · 0.94mm/px · z∈[-48,+106]mm · 4 of 53 slices shown]
[im 1/53]
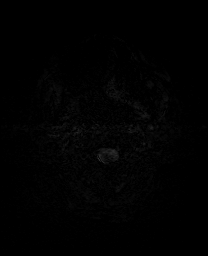
[im 18/53]
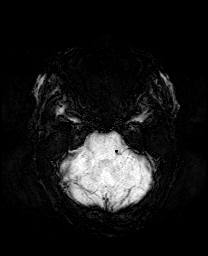
[im 35/53]
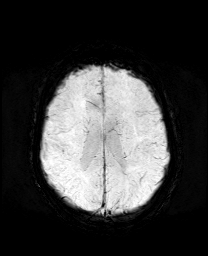
[im 53/53]
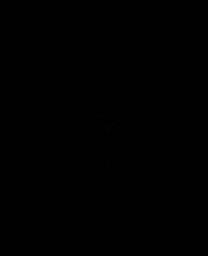

[T2 · coronal · 5.0mm · 0.34mm/px · 2 of 29 slices shown (2 of 2)]
[im 1/29]
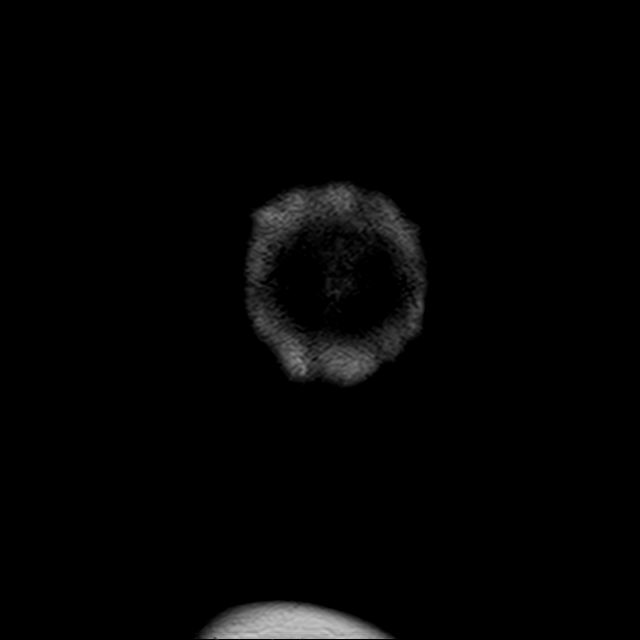
[im 29/29]
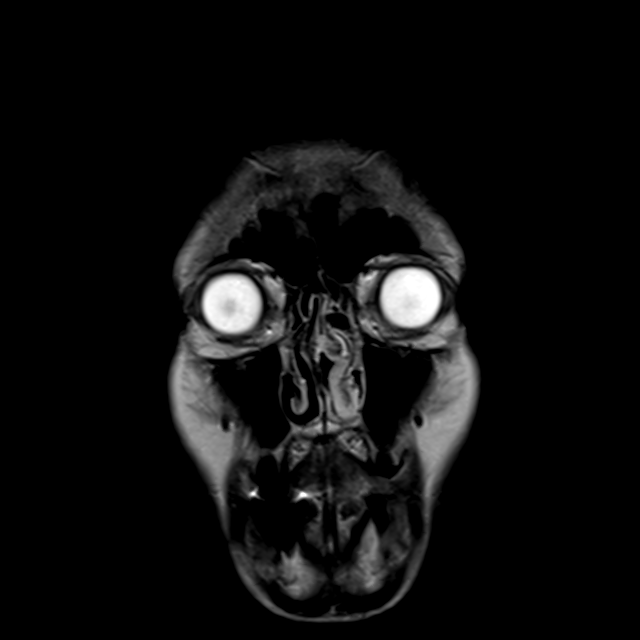

[32 of 48 positions shown; findings below may reference images not displayed]

FINDINGS: MRI HEAD FINDINGS

Brain: Cerebral volume within normal limits for age. Patchy
multifocal T2/FLAIR hyperintensities seen involving the
periventricular, deep, and subcortical white matter both cerebral
hemispheres, nonspecific, but moderate nature.

No abnormal foci of restricted diffusion to suggest acute or
subacute ischemia. Gray-white matter differentiation maintained. No
areas of chronic infarction. No acute or chronic intracranial
hemorrhage.

No mass lesion, midline shift or mass effect. No hydrocephalus. No
extra-axial fluid collection. Major dural sinuses grossly patent.

Pituitary gland suprasellar region normal. Midline structures intact
and normal.

Vascular: Major intracranial vascular flow voids are maintained.

Skull and upper cervical spine: Craniocervical junction normal.
Upper cervical spine normal. Bone marrow signal intensity within
normal limits. No scalp soft tissue abnormality.

Sinuses/Orbits: Globes and orbital soft tissues within normal
limits. Paranasal sinuses are clear. No mastoid effusion. Inner ear
structures normal.

Other: None.

MRA HEAD FINDINGS

ANTERIOR CIRCULATION:

Distal cervical segments of the internal carotid arteries are patent
with antegrade flow. Petrous, cavernous, and supraclinoid segments
patent without flow-limiting stenosis. ICA termini widely patent. A1
segments, anterior communicating artery common anterior cerebral
arteries widely patent. No M1 stenosis or occlusion. No proximal M2
occlusion. Distal MCA branches well perfused and symmetric.

POSTERIOR CIRCULATION:

Left vertebral artery dominant and widely patent to the
vertebrobasilar junction. Right vertebral artery hypoplastic and
largely terminates in PICA. Posterior inferior cerebral arteries
patent bilaterally. Basilar artery mildly tortuous but widely patent
to its distal aspect. Superior cerebral arteries patent bilaterally.
Left PCA supplied via the basilar. Right PCA largely supplied by the
right posterior communicating artery. There is a short-segment
severe stenosis within the mid left P2 segment. PCAs otherwise
widely patent.
IMPRESSION: MRI HEAD IMPRESSION:

1. No acute intracranial abnormality.
2. Moderate T2/FLAIR hyperintensities involving the supratentorial
cerebral white matter, nonspecific. Changes are most commonly
associated with sequelae of chronic small vessel ischemic disease.
Changes related to underlying migrainous disorder could also be
considered given the history of migraine. Demyelinating disease
could also potentially have this appearance, and could be considered
in the correct clinical setting.

MRA HEAD IMPRESSION:

1. Negative intracranial MRA for large vessel occlusion.
2. Single short-segment severe stenosis involving the mid left P2
segment.
3. Otherwise essentially normal intracranial MRA.

## 2019-05-26 ENCOUNTER — Ambulatory Visit (INDEPENDENT_AMBULATORY_CARE_PROVIDER_SITE_OTHER): Payer: Medicare Other | Admitting: *Deleted

## 2019-05-26 ENCOUNTER — Other Ambulatory Visit: Payer: Self-pay

## 2019-05-26 DIAGNOSIS — Z23 Encounter for immunization: Secondary | ICD-10-CM

## 2019-05-26 NOTE — Progress Notes (Signed)
Pt tolerated vaccine well. Belem Hintze, CMA  

## 2019-06-10 ENCOUNTER — Other Ambulatory Visit: Payer: Self-pay | Admitting: Family Medicine

## 2019-06-10 DIAGNOSIS — I10 Essential (primary) hypertension: Secondary | ICD-10-CM

## 2019-06-22 ENCOUNTER — Other Ambulatory Visit: Payer: Self-pay | Admitting: *Deleted

## 2019-06-24 NOTE — Telephone Encounter (Signed)
Scheduled patient with Dr. Caron Presume as pcp wasn't available until mid December.  Tnya Ades,CMA

## 2019-06-25 ENCOUNTER — Other Ambulatory Visit: Payer: Self-pay

## 2019-06-30 ENCOUNTER — Telehealth (INDEPENDENT_AMBULATORY_CARE_PROVIDER_SITE_OTHER): Payer: Medicare Other | Admitting: Family Medicine

## 2019-06-30 ENCOUNTER — Other Ambulatory Visit: Payer: Self-pay

## 2019-06-30 DIAGNOSIS — M8949 Other hypertrophic osteoarthropathy, multiple sites: Secondary | ICD-10-CM

## 2019-06-30 DIAGNOSIS — M159 Polyosteoarthritis, unspecified: Secondary | ICD-10-CM

## 2019-06-30 MED ORDER — TRAMADOL HCL 50 MG PO TABS: 50.0000 mg | ORAL_TABLET | Freq: Three times a day (TID) | ORAL | 0 refills | Status: AC | PRN

## 2019-06-30 NOTE — Assessment & Plan Note (Signed)
Counseled patient on limiting use of tramadol as much as possible.  Also discussed her need for an actual office visit.  Agreed to fill patient option for tramadol for 3 months and will reassess availability to come to the office at that point -Prescription sent to [place] [facility]

## 2019-06-30 NOTE — Progress Notes (Signed)
[place] Telemedicine Visit  Patient consented to have virtual visit. Method of visit: Initial contact via video was made but due to technological issues I was unable to hear the patient so conducted the rest of the visit via telephone.  Encounter participants: Patient: Jessica Beck - located at Home Provider: Gifford Shave - located at [facility] Others (if applicable):   Chief Complaint: Medication management  HPI:  Patient reports that she has a visit because she has not been seen in a year and needs a refill on her prescription for tramadol.  Patient reports she has history of arthritis.  Patient reports that she has tried multiple treatments but tramadol seems to work the best.  Patient reports that she takes 1 in the morning when she wakes up so that she can walk her dog.  She also takes 1 during the day depending on her activities.  She takes a third 1 at night before she goes to bed.  Patient reports she takes 3 pills a day approximately 4 days a week.  If she is not doing anything during the day she will only take 2.  Patient also was prescribed meloxicam by her orthopedist.  When she saw her GI specialist he recommended she not take the meloxicam so patient has been splitting her meloxicam 15 mg in half and been taking 7.5 mg meloxicam a day.  Counseled patient on limiting use of tramadol when possible.  Also discussed with her the fact that she needs to have an in office visit for routine health maintenance screenings and lab work.  Patient reports that she does not feel comfortable coming to the office given the Covid pandemic.  I completely understand and I am willing to prescribe her medication for an additional 3 months and we will reevaluate and hopefully see her in the office then.  ROS: per HPI  Pertinent PMHx: History of osteoarthritis, GERD, IBS, hypertension, migraines  Exam:  Respiratory: Normal work of breathing.  Speaking  clearly  Assessment/Plan:  Osteoarthritis Counseled patient on limiting use of tramadol as much as possible.  Also discussed her need for an actual office visit.  Agreed to fill patient option for tramadol for 3 months and will reassess availability to come to the office at that point -Prescription sent to [place] [facility]

## 2019-09-03 ENCOUNTER — Ambulatory Visit: Payer: Medicare Other

## 2019-09-10 ENCOUNTER — Ambulatory Visit: Payer: Medicare Other | Attending: Internal Medicine

## 2019-09-10 DIAGNOSIS — Z23 Encounter for immunization: Secondary | ICD-10-CM

## 2019-09-10 NOTE — Progress Notes (Signed)
   Covid-19 Vaccination Clinic  Name:  Jessica Beck    MRN: [id number] DOB: [birth year]  09/10/2019  [name] was observed post Covid-19 immunization for 15 minutes without incidence. She was provided with Vaccine Information Sheet and instruction to access the V-Safe system.   [name] was instructed to call 911 with any severe reactions post vaccine: Marland Kitchen Difficulty breathing  . Swelling of your face and throat  . A fast heartbeat  . A bad rash all over your body  . Dizziness and weakness    Immunizations Administered    Name Date Dose VIS Date Route   Pfizer COVID-19 Vaccine 09/10/2019  9:15 AM 0.3 mL 07/16/2019 Intramuscular   Manufacturer: [place]   Lot: [id number]   [place]: [id number]

## 2019-09-23 ENCOUNTER — Other Ambulatory Visit: Payer: Self-pay | Admitting: Family Medicine

## 2019-09-23 DIAGNOSIS — I10 Essential (primary) hypertension: Secondary | ICD-10-CM

## 2019-09-24 NOTE — Telephone Encounter (Signed)
Spoke with pt. She stated that she gets her 2nd COVID vaccine Mar. 2nd, and does not want to be around anyone until it is good in her system. I told her that the doctor refilled it for a month, so she should be good by the time she gets her next vaccine. If for some reason she does not get her 2nd vaccine. I informed pt to call the office to see where to go from there. Pt understood.  Salvatore Marvel, CMA

## 2019-09-24 NOTE — Telephone Encounter (Signed)
Last visit to address HTN with labs 10/2017. Will refill for 1 month, but no further refills after that without a visit for labs and to discuss HTN.  Gladys Damme, MD [facility], PGY-1

## 2019-10-05 ENCOUNTER — Ambulatory Visit: Payer: Medicare Other | Attending: Internal Medicine

## 2019-10-05 DIAGNOSIS — Z23 Encounter for immunization: Secondary | ICD-10-CM | POA: Insufficient documentation

## 2019-10-05 NOTE — Progress Notes (Signed)
   Covid-19 Vaccination Clinic  Name:  Jessica Beck    MRN: [id number] DOB: [birth year]  10/05/2019  Ms. Kedzior was observed post Covid-19 immunization for 15 minutes without incident. She was provided with Vaccine Information Sheet and instruction to access the V-Safe system.   Ms. Lumia was instructed to call 911 with any severe reactions post vaccine: Marland Kitchen Difficulty breathing  . Swelling of face and throat  . A fast heartbeat  . A bad rash all over body  . Dizziness and weakness   Immunizations Administered    Name Date Dose VIS Date Route   Pfizer COVID-19 Vaccine 10/05/2019  9:15 AM 0.3 mL 07/16/2019 Intramuscular   Manufacturer: [place]   Lot: [id number]   [place]: [id number]

## 2019-11-03 ENCOUNTER — Other Ambulatory Visit: Payer: Self-pay | Admitting: Family Medicine

## 2019-11-03 DIAGNOSIS — Z13820 Encounter for screening for osteoporosis: Secondary | ICD-10-CM

## 2019-11-08 ENCOUNTER — Encounter: Payer: Self-pay | Admitting: Family Medicine

## 2019-11-08 ENCOUNTER — Other Ambulatory Visit: Payer: Self-pay

## 2019-11-08 ENCOUNTER — Ambulatory Visit (INDEPENDENT_AMBULATORY_CARE_PROVIDER_SITE_OTHER): Payer: Medicare Other | Admitting: Family Medicine

## 2019-11-08 VITALS — BP 120/70 | HR 85 | Ht 62.0 in | Wt 162.2 lb

## 2019-11-08 DIAGNOSIS — M19041 Primary osteoarthritis, right hand: Secondary | ICD-10-CM | POA: Diagnosis not present

## 2019-11-08 DIAGNOSIS — Z23 Encounter for immunization: Secondary | ICD-10-CM

## 2019-11-08 DIAGNOSIS — Z791 Long term (current) use of non-steroidal anti-inflammatories (NSAID): Secondary | ICD-10-CM | POA: Diagnosis not present

## 2019-11-08 DIAGNOSIS — D649 Anemia, unspecified: Secondary | ICD-10-CM

## 2019-11-08 DIAGNOSIS — I10 Essential (primary) hypertension: Secondary | ICD-10-CM

## 2019-11-08 DIAGNOSIS — Z130 Encounter for screening for diseases of the blood and blood-forming organs and certain disorders involving the immune mechanism: Secondary | ICD-10-CM | POA: Diagnosis not present

## 2019-11-08 DIAGNOSIS — Z1231 Encounter for screening mammogram for malignant neoplasm of breast: Secondary | ICD-10-CM | POA: Diagnosis not present

## 2019-11-08 DIAGNOSIS — J301 Allergic rhinitis due to pollen: Secondary | ICD-10-CM

## 2019-11-08 DIAGNOSIS — Z13220 Encounter for screening for lipoid disorders: Secondary | ICD-10-CM | POA: Diagnosis not present

## 2019-11-08 DIAGNOSIS — Z0000 Encounter for general adult medical examination without abnormal findings: Secondary | ICD-10-CM | POA: Diagnosis not present

## 2019-11-08 DIAGNOSIS — M19042 Primary osteoarthritis, left hand: Secondary | ICD-10-CM

## 2019-11-08 DIAGNOSIS — J302 Other seasonal allergic rhinitis: Secondary | ICD-10-CM | POA: Diagnosis not present

## 2019-11-08 MED ORDER — TETANUS-DIPHTH-ACELL PERTUSSIS 5-2.5-18.5 LF-MCG/0.5 IM SUSP: 0.5000 mL | Freq: Once | INTRAMUSCULAR | 0 refills | Status: AC

## 2019-11-08 MED ORDER — FLUTICASONE PROPIONATE 50 MCG/ACT NA SUSP: 2.0000 | Freq: Every day | NASAL | 0 refills | Status: DC

## 2019-11-08 MED ORDER — ZOSTER VAC RECOMB ADJUVANTED 50 MCG/0.5ML IM SUSR: 0.5000 mL | Freq: Once | INTRAMUSCULAR | 0 refills | Status: AC

## 2019-11-08 MED ORDER — TRAMADOL HCL 50 MG PO TABS
50.0000 mg | ORAL_TABLET | Freq: Three times a day (TID) | ORAL | 3 refills | Status: DC | PRN
Start: 2019-11-08 — End: 2020-07-17

## 2019-11-08 MED ORDER — TELMISARTAN-HCTZ 80-25 MG PO TABS: 1.0000 | ORAL_TABLET | Freq: Every day | ORAL | 0 refills | Status: DC

## 2019-11-08 MED ORDER — FLUTICASONE PROPIONATE 50 MCG/ACT NA SUSP
2.0000 | Freq: Every day | NASAL | 0 refills | Status: DC
Start: 2019-11-08 — End: 2019-11-08

## 2019-11-08 NOTE — Patient Instructions (Addendum)
It was a pleasure to meet you!   1. I took several labs for routine screening and to check your kidney function. I will let you know the results and based on this we can determine which NSAID is best to use for your arthritis pain.  2. If the tramadol is not working at the prescription I gave you, schedule a follow up and we can discuss best plan to treat your arthritis.  3. For allergies I recommend using flonase in each nostril daily.  4. You no longer need mammograms, but I do recommend the bone density scan which you already have scheduled.  Be Well!   Dr. Chauncey Reading

## 2019-11-09 DIAGNOSIS — Z0000 Encounter for general adult medical examination without abnormal findings: Secondary | ICD-10-CM | POA: Insufficient documentation

## 2019-11-09 DIAGNOSIS — Z7189 Other specified counseling: Secondary | ICD-10-CM | POA: Insufficient documentation

## 2019-11-09 LAB — BASIC METABOLIC PANEL
BUN/Creatinine Ratio: 12 (ref 12–28)
BUN: 12 mg/dL (ref 8–27)
CO2: 27 mmol/L (ref 20–29)
Calcium: 10.1 mg/dL (ref 8.7–10.3)
Chloride: 102 mmol/L (ref 96–106)
Creatinine, Ser: 1 mg/dL (ref 0.57–1.00)
GFR calc Af Amer: 63 mL/min/{1.73_m2} (ref >59)
GFR calc non Af Amer: 54 mL/min/{1.73_m2} — ABNORMAL LOW (ref >59)
Glucose: 98 mg/dL (ref 65–99)
Potassium: 3.8 mmol/L (ref 3.5–5.2)
Sodium: 142 mmol/L (ref 134–144)

## 2019-11-09 LAB — CBC WITH DIFFERENTIAL/PLATELET
Basophils Absolute: 0 10*3/uL (ref 0.0–0.2)
Basos: 1 %
EOS (ABSOLUTE): 0.1 10*3/uL (ref 0.0–0.4)
Eos: 3 %
Hematocrit: 44.4 % (ref 34.0–46.6)
Hemoglobin: 15.4 g/dL (ref 11.1–15.9)
Immature Grans (Abs): 0 10*3/uL (ref 0.0–0.1)
Immature Granulocytes: 0 %
Lymphocytes Absolute: 2.3 10*3/uL (ref 0.7–3.1)
Lymphs: 44 %
MCH: 32.2 pg (ref 26.6–33.0)
MCHC: 34.7 g/dL (ref 31.5–35.7)
MCV: 93 fL (ref 79–97)
Monocytes Absolute: 0.4 10*3/uL (ref 0.1–0.9)
Monocytes: 7 %
Neutrophils Absolute: 2.4 10*3/uL (ref 1.4–7.0)
Neutrophils: 45 %
Platelets: 256 10*3/uL (ref 150–450)
RBC: 4.79 x10E6/uL (ref 3.77–5.28)
RDW: 12.6 % (ref 11.7–15.4)
WBC: 5.2 10*3/uL (ref 3.4–10.8)

## 2019-11-09 LAB — LIPID PANEL
Chol/HDL Ratio: 2.5 ratio (ref 0.0–4.4)
Cholesterol, Total: 193 mg/dL (ref 100–199)
HDL: 77 mg/dL (ref >39)
LDL Chol Calc (NIH): 98 mg/dL (ref 0–99)
Triglycerides: 100 mg/dL (ref 0–149)
VLDL Cholesterol Cal: 18 mg/dL (ref 5–40)

## 2019-11-09 NOTE — Assessment & Plan Note (Signed)
BP today well below goal at 120/70, patient w/ no symptoms or complaints.  -Check BMP today -Refill telmisartan/HCTZ, can go to 90 days supply if cr WNL

## 2019-11-09 NOTE — Assessment & Plan Note (Signed)
Due to significant and long history of OA that affects functionality and overall safe use of tramadol with PDMP reflecting patient's statements, can increase to tramadol to 3-4 50mg  tablets daily for optimal functioning. Will closely monitor patient, if no improvement, will follow up.  - BMP ordered today to assess cr function. Can alternate tylenol 1g with NSAIDs if cr WNL. Will discuss meloxicam as safer for GERD with patient pending BMP.

## 2019-11-09 NOTE — Assessment & Plan Note (Signed)
Offered patient second generation antihistamine such as loratidine or cetirizine, patient declined stating these medicines do not work for her. Refilled flonase. We do not do allergy shots, but offered to refer to an allergist. Patient declined.

## 2019-11-09 NOTE — Progress Notes (Signed)
SUBJECTIVE:   CHIEF COMPLAINT / HPI: refill of medication  OA: patient has multiple sites of OA including both hands, R hip replacement, bilateral knee replacements. OA is very uncomfortable and patient has difficulty doing daily function of walking her dog, doing basic daily household tasks without tramadol 50 mg. Currently she is using 1-2 pills per day, but is more functional with 8 pills per day. She has tried PT as well as stronger opiates such as hydrocodone in the past and PT did not help, and she does not like the feeling/side effect of oxycodone or any other stronger medication. She feels better when she can take an NSAID, but several years ago had elevation in kidney function labs (per patient report). She was taking meloxicam which helped, but also has GERD, and GI doctor removed her from meloxicam.  Healthcare maintenance: patient requires annual physical today and she would like referrals for a DEXA scan, mammogram, TDAP and shingrix vaccines.  Allergies: requests "allergy shots"  PERTINENT  PMH / PSH: HTN, OA, seasonal allergies, GERD, HLD  OBJECTIVE:   BP 120/70   Pulse 85   Ht 5\' 2"  (1.575 m)   Wt 162 lb 4 oz (73.6 kg)   SpO2 98%   BMI 29.68 kg/m   Physical Exam Constitutional:      General: She is not in acute distress.    Appearance: Normal appearance. She is normal weight. She is not ill-appearing or toxic-appearing.  HENT:     Head: Normocephalic and atraumatic.     Nose: Nose normal.     Mouth/Throat:     Mouth: Mucous membranes are moist.     Pharynx: Oropharynx is clear.  Eyes:     Conjunctiva/sclera: Conjunctivae normal.  Cardiovascular:     Rate and Rhythm: Normal rate and regular rhythm.     Pulses: Normal pulses.     Heart sounds: Normal heart sounds.  Pulmonary:     Effort: Pulmonary effort is normal.     Breath sounds: Normal breath sounds.  Abdominal:     General: Abdomen is flat.     Palpations: Abdomen is soft.  Musculoskeletal:      General: No swelling or deformity.     Right lower leg: No edema.     Left lower leg: No edema.  Skin:    General: Skin is warm and dry.  Neurological:     General: No focal deficit present.     Mental Status: She is alert and oriented to person, place, and time. Mental status is at baseline.  Psychiatric:        Mood and Affect: Mood normal.        Behavior: Behavior normal.    ASSESSMENT/PLAN:  Jessica Beck is a 79 yo woman with OA, HTN, HLD, GERD, and seasonal allergies.  HTN (hypertension) BP today well below goal at 120/70, patient w/ no symptoms or complaints.  -Check BMP today -Refill telmisartan/HCTZ, can go to 90 days supply if cr WNL  Osteoarthritis Due to significant and long history of OA that affects functionality and overall safe use of tramadol with PDMP reflecting patient's statements, can increase to tramadol to 3-4 50mg  tablets daily for optimal functioning. Will closely monitor patient, if no improvement, will follow up.  - BMP ordered today to assess cr function. Can alternate tylenol 1g with NSAIDs if cr WNL. Will discuss meloxicam as safer for GERD with patient pending BMP.  Seasonal allergic rhinitis due to pollen Offered patient second  generation antihistamine such as loratidine or cetirizine, patient declined stating these medicines do not work for her. Refilled flonase. We do not do allergy shots, but offered to refer to an allergist. Patient declined.   Healthcare maintenance - BMP, CBC, lipid panel ordered today - TDAP and shingrix prescribed, patient to go to a pharmacy that stocks them - Patient no longer requires mammograms as she is 78yo and has normal imaging - DEXA scan order sent via fax to [place] [place]

## 2019-11-09 NOTE — Assessment & Plan Note (Signed)
-   BMP, CBC, lipid panel ordered today - TDAP and shingrix prescribed, patient to go to a pharmacy that stocks them - Patient no longer requires mammograms as she is 78yo and has normal imaging - DEXA scan order sent via fax to [facility]

## 2019-11-10 ENCOUNTER — Telehealth: Payer: Self-pay | Admitting: Family Medicine

## 2019-11-10 NOTE — Telephone Encounter (Addendum)
Results of lab work very good. Kidney function is perfectly normal. Patient can alternate every other day with tylenol 1000 mg and NSAID such as ibuprofen 600 mg for arthritis pain or aleve 220mg  twice a day (do not take both ibuprofen and aleve, one or the other). Meloxicam is of benefit to her due to GERD, it is more protective of stomach lining than ibuprofen or aleve. I can refill this for her if she would like. Left VM saying results are back and to call for more information.  Lipid panel also overall good, total cholesterol less than goal of 200 (193), good cholesterol (HDL) above goal of 40 (77). ASCDV risk in next 10 years 18.8%, still recommend moderate to high-intensity statin. LDL not at goal of <70, it is currently 98. Patient is not on statins as she does not tolerate them, taking Omega 3, Vitamin E, and ASA 81mg . Statins are recommended as first-line, and if problem with one statin, recommend trying others at lower doses first. If absolutely against statin, there are other options we can discuss, but this requires a longer conversation to elucidate her goals.  Also regarding prescriptions, I can increase all of them to 90 days but looked into tramadol and because the amount of pills would be so high at one time, I can only do 1 month at a time, but gave her 3 refills as well as she can use up to 4 pills per day (120 in one month).   Gladys Damme, MD [facility], PGY-1

## 2019-11-19 ENCOUNTER — Encounter: Payer: Self-pay | Admitting: Family Medicine

## 2019-11-19 DIAGNOSIS — M85832 Other specified disorders of bone density and structure, left forearm: Secondary | ICD-10-CM | POA: Diagnosis not present

## 2019-11-23 ENCOUNTER — Telehealth: Payer: Self-pay | Admitting: Family Medicine

## 2019-11-23 NOTE — Telephone Encounter (Signed)
Left VM for patient. DEXA scan showed stable BMD on Left hip, increased BMD on spine, and osteopenia T-1.8 in left radius. She takes Vit D3 and prilosec. Recommend taking caltrate D 2 tablets daily for a calcium supplement.  Gladys Damme, MD [facility], PGY-1

## 2019-12-03 ENCOUNTER — Other Ambulatory Visit: Payer: Self-pay | Admitting: Family Medicine

## 2019-12-03 DIAGNOSIS — I10 Essential (primary) hypertension: Secondary | ICD-10-CM

## 2019-12-06 ENCOUNTER — Other Ambulatory Visit: Payer: Self-pay

## 2019-12-07 ENCOUNTER — Other Ambulatory Visit: Payer: Self-pay | Admitting: Family Medicine

## 2020-01-04 ENCOUNTER — Other Ambulatory Visit: Payer: Self-pay | Admitting: Family Medicine

## 2020-01-04 DIAGNOSIS — I10 Essential (primary) hypertension: Secondary | ICD-10-CM

## 2020-02-08 ENCOUNTER — Other Ambulatory Visit: Payer: Self-pay

## 2020-02-08 ENCOUNTER — Ambulatory Visit (INDEPENDENT_AMBULATORY_CARE_PROVIDER_SITE_OTHER): Payer: Medicare Other | Admitting: Family Medicine

## 2020-02-08 VITALS — BP 122/72 | HR 94 | Ht 62.0 in

## 2020-02-08 DIAGNOSIS — R5383 Other fatigue: Secondary | ICD-10-CM

## 2020-02-08 DIAGNOSIS — R0602 Shortness of breath: Secondary | ICD-10-CM | POA: Diagnosis present

## 2020-02-08 DIAGNOSIS — M546 Pain in thoracic spine: Secondary | ICD-10-CM | POA: Diagnosis not present

## 2020-02-08 NOTE — Progress Notes (Signed)
SUBJECTIVE:   CHIEF COMPLAINT / HPI:   Joint Pain, Fever, SOB 5/31 Had 1st Shingrix Vaccination Few days after had arm soreness which she thought was worse when it was She thinks in the first few weeks she would get joint pains, especially in her hands, and have temps >100 but less than 101 Has a history of RA Was able to get fevers controlled In the last two weeks, started to have some pain in her right chest that would cause her to not be able to breathe well, felt it mostly down in her right breast, would come and go, not related to activity She states this went away over the weekend as hasn't come back She is now having an ache in the right upper back that feels sore She has very low on energy She reports shortness of breath at all times, worse with exertion Continues to walk her dog daily with tylenol, but not as long as usual Not sleeping well at night, mind is racing Sleep schedule is very scattered, is needing to nap during the day Only in 99-100 range now, was only greater than 100.4 in the first few days No redness or swelling in the joints Has chronic IBS, no changes recently No vomiting Has had some nausea Has been eating a little less than usual, but drinking normally Coughing and producing dark brown/green phlegm the whole time as well  Received both COVID vaccinations, 2/5 and 3/2 No known sick contacts  PERTINENT  PMH / PSH: HTN, Stroke, IBS, GERD, OA, HLD  OBJECTIVE:   BP 122/72   Pulse 94   Ht 5\' 2"  (1.575 m)   SpO2 98%   BMI 29.68 kg/m    Physical Exam:  General: 79 y.o. female in NAD Cardio: RRR no m/r/g Lungs: minimal right lower lung crackles, no wheezing, no increased work of breathing Abdomen: Soft, non-tender to palpation, non-distended, positive bowel sounds Skin: warm and dry Extremities: No edema, hands with mild Heberden's nodes b/l, no redness or increased warmth in joints Back: TTP right rib 6 angle with muscle tension overlying, no  midline TTP    ASSESSMENT/PLAN:   Shortness of breath Given patient's occasional shortness of breath and cough with dark brown/green sputum production, could consider pneumonia.  Much less likely Covid given the length of her symptoms, no true fevers in recent weeks, has been fully vaccinated.  Given the length of time from her shingles vaccination, these are likely not related.  Will obtain chest x-ray, BMP, CBC to further assess.  Will wait for chest x-ray to determine need for antibiotics.  Given patient's age and comorbidities, she would need dual therapy with azithro and augmentin.  Lab was closing, therefore patient's labs were made as future orders, patient advised to come back the next morning to have them drawn.  Fatigue Nondescript fatigue that could be related to illness, could also be a longer standing.  Will obtain CBC and TSH today to assess for possible cause.  We will continue to monitor with improvement after infectious treatment if chest x-ray is positive.  Acute right-sided thoracic back pain Patient has tenderness to palpation over the angle of her right rib, is likely that she has slightly displaced her rib from coughing.  Discussed this with patient.  Can continue heat and Tylenol as needed for pain.  Should improve over time.  Return to care if it does not.     Cleophas Dunker, [place]

## 2020-02-08 NOTE — Patient Instructions (Signed)
Thank you for coming to see me today. It was a pleasure. Today we talked about:   I have placed an order for a chest x-ray.  Please go to [facility] at [address] to have this completed.  You do not need an appointment, but if you would like to call them beforehand, their number is [phone].  We will contact you with your results afterwards.  Please come back tomorrow for a lab visit as well.  I will contact you about your results.  If you have any questions or concerns, please do not hesitate to call the office at [phone].  Best,   [place] Constable, DO

## 2020-02-09 ENCOUNTER — Telehealth: Payer: Self-pay

## 2020-02-09 ENCOUNTER — Other Ambulatory Visit: Payer: Self-pay | Admitting: Family Medicine

## 2020-02-09 ENCOUNTER — Other Ambulatory Visit: Payer: Self-pay

## 2020-02-09 ENCOUNTER — Ambulatory Visit
Admission: RE | Admit: 2020-02-09 | Discharge: 2020-02-09 | Disposition: A | Discharge status: Home / Self Care | Payer: Medicare Other | Source: Ambulatory Visit | Attending: Family Medicine | Admitting: Family Medicine

## 2020-02-09 ENCOUNTER — Other Ambulatory Visit: Payer: Medicare Other

## 2020-02-09 DIAGNOSIS — R0602 Shortness of breath: Secondary | ICD-10-CM

## 2020-02-09 DIAGNOSIS — R5383 Other fatigue: Secondary | ICD-10-CM

## 2020-02-09 DIAGNOSIS — M546 Pain in thoracic spine: Secondary | ICD-10-CM | POA: Insufficient documentation

## 2020-02-09 DIAGNOSIS — R911 Solitary pulmonary nodule: Secondary | ICD-10-CM

## 2020-02-09 NOTE — Assessment & Plan Note (Signed)
Patient has tenderness to palpation over the angle of her right rib, is likely that she has slightly displaced her rib from coughing.  Discussed this with patient.  Can continue heat and Tylenol as needed for pain.  Should improve over time.  Return to care if it does not.

## 2020-02-09 NOTE — Assessment & Plan Note (Signed)
Nondescript fatigue that could be related to illness, could also be a longer standing.  Will obtain CBC and TSH today to assess for possible cause.  We will continue to monitor with improvement after infectious treatment if chest x-ray is positive.

## 2020-02-09 NOTE — Progress Notes (Addendum)
CT chest ordered for follow-up of chest x-ray with left upper lobe pulmonary nodule.  Changed to stat and added shortness of breath to diagnosis, as otherwise soonest availble was 7/20.  Patient presented with SOB as complaint and this should be completed in next few days.

## 2020-02-09 NOTE — Telephone Encounter (Signed)
Called patient and advised of possible spot on lung and need for CT to further characterize.  Will order.   [facility] team,  She has no scheduling conflicts.  Could we please schedule a CT chest for her? Patient gives permission to leave VM with information if she doesn't answer.

## 2020-02-09 NOTE — Addendum Note (Signed)
Addended by: Cleophas Dunker on: 02/09/2020 05:47 PM   Modules accepted: Orders

## 2020-02-09 NOTE — Assessment & Plan Note (Addendum)
Given patient's occasional shortness of breath and cough with dark brown/green sputum production, could consider pneumonia.  Much less likely Covid given the length of her symptoms, no true fevers in recent weeks, has been fully vaccinated.  Given the length of time from her shingles vaccination, these are likely not related.  Will obtain chest x-ray, BMP, CBC to further assess.  Will wait for chest x-ray to determine need for antibiotics.  Given patient's age and comorbidities, she would need dual therapy with azithro and augmentin.  Lab was closing, therefore patient's labs were made as future orders, patient advised to come back the next morning to have them drawn.

## 2020-02-09 NOTE — Telephone Encounter (Signed)
Radiology calls nurse line to give imaging report and to make sure ordering provider is aware. Will forward to provider who ordered test.

## 2020-02-10 ENCOUNTER — Other Ambulatory Visit: Payer: Self-pay | Admitting: Family Medicine

## 2020-02-10 DIAGNOSIS — R059 Cough, unspecified: Secondary | ICD-10-CM

## 2020-02-10 LAB — CBC WITH DIFFERENTIAL/PLATELET
Basophils Absolute: 0.1 10*3/uL (ref 0.0–0.2)
Basos: 1 %
EOS (ABSOLUTE): 0.2 10*3/uL (ref 0.0–0.4)
Eos: 3 %
Hematocrit: 41.2 % (ref 34.0–46.6)
Hemoglobin: 14.1 g/dL (ref 11.1–15.9)
Immature Grans (Abs): 0 10*3/uL (ref 0.0–0.1)
Immature Granulocytes: 0 %
Lymphocytes Absolute: 2.8 10*3/uL (ref 0.7–3.1)
Lymphs: 38 %
MCH: 31 pg (ref 26.6–33.0)
MCHC: 34.2 g/dL (ref 31.5–35.7)
MCV: 91 fL (ref 79–97)
Monocytes Absolute: 0.6 10*3/uL (ref 0.1–0.9)
Monocytes: 8 %
Neutrophils Absolute: 3.7 10*3/uL (ref 1.4–7.0)
Neutrophils: 50 %
Platelets: 393 10*3/uL (ref 150–450)
RBC: 4.55 x10E6/uL (ref 3.77–5.28)
RDW: 11.7 % (ref 11.7–15.4)
WBC: 7.3 10*3/uL (ref 3.4–10.8)

## 2020-02-10 LAB — BASIC METABOLIC PANEL
BUN/Creatinine Ratio: 16 (ref 12–28)
BUN: 15 mg/dL (ref 8–27)
CO2: 24 mmol/L (ref 20–29)
Calcium: 9.8 mg/dL (ref 8.7–10.3)
Chloride: 96 mmol/L (ref 96–106)
Creatinine, Ser: 0.95 mg/dL (ref 0.57–1.00)
GFR calc Af Amer: 67 mL/min/{1.73_m2} (ref >59)
GFR calc non Af Amer: 58 mL/min/{1.73_m2} — ABNORMAL LOW (ref >59)
Glucose: 116 mg/dL — ABNORMAL HIGH (ref 65–99)
Potassium: 4.1 mmol/L (ref 3.5–5.2)
Sodium: 136 mmol/L (ref 134–144)

## 2020-02-10 LAB — TSH: TSH: 0.644 u[IU]/mL (ref 0.450–4.500)

## 2020-02-10 MED ORDER — BENZONATATE 100 MG PO CAPS: 100.0000 mg | ORAL_CAPSULE | Freq: Two times a day (BID) | ORAL | 0 refills | Status: DC | PRN

## 2020-02-10 NOTE — Telephone Encounter (Signed)
If you change the order and add Shortness of breath to the diagnosis and make it a stat CT we can get it done either tomorrow or Monday. Please advise. I can call in the am and get it scheduled. Ottis Stain, CMA

## 2020-02-10 NOTE — Addendum Note (Signed)
Addended by: Cleophas Dunker on: 02/10/2020 09:17 PM   Modules accepted: Orders

## 2020-02-10 NOTE — Telephone Encounter (Signed)
Order updated

## 2020-02-10 NOTE — Telephone Encounter (Signed)
Appt scheduled for 7/20 at [facility] Is this ok? This is the first available at [facility]. Ottis Stain, CMA

## 2020-02-10 NOTE — Telephone Encounter (Signed)
She will likely want this sooner, and I agree that sooner would be better.  I think we should try another facility.  Do you need me to change the order?

## 2020-02-10 NOTE — Progress Notes (Signed)
error 

## 2020-02-10 NOTE — Progress Notes (Signed)
Requested cough medication in phone encounter while discussing results

## 2020-02-11 ENCOUNTER — Other Ambulatory Visit: Payer: Self-pay

## 2020-02-11 ENCOUNTER — Ambulatory Visit (HOSPITAL_COMMUNITY)
Admission: RE | Admit: 2020-02-11 | Discharge: 2020-02-11 | Disposition: A | Discharge status: Home / Self Care | Payer: Medicare Other | Source: Ambulatory Visit | Attending: Family Medicine | Admitting: Family Medicine

## 2020-02-11 ENCOUNTER — Encounter (HOSPITAL_COMMUNITY): Payer: Self-pay

## 2020-02-11 ENCOUNTER — Other Ambulatory Visit: Payer: Self-pay | Admitting: Family Medicine

## 2020-02-11 DIAGNOSIS — R918 Other nonspecific abnormal finding of lung field: Secondary | ICD-10-CM | POA: Diagnosis not present

## 2020-02-11 DIAGNOSIS — R911 Solitary pulmonary nodule: Secondary | ICD-10-CM

## 2020-02-11 DIAGNOSIS — R0602 Shortness of breath: Secondary | ICD-10-CM | POA: Insufficient documentation

## 2020-02-11 DIAGNOSIS — I700 Atherosclerosis of aorta: Secondary | ICD-10-CM | POA: Diagnosis not present

## 2020-02-11 DIAGNOSIS — J069 Acute upper respiratory infection, unspecified: Secondary | ICD-10-CM | POA: Diagnosis not present

## 2020-02-11 DIAGNOSIS — I2510 Atherosclerotic heart disease of native coronary artery without angina pectoris: Secondary | ICD-10-CM | POA: Diagnosis not present

## 2020-02-11 HISTORY — DX: Unspecified asthma, uncomplicated: J45.909

## 2020-02-11 MED ORDER — SODIUM CHLORIDE (PF) 0.9 % IJ SOLN
INTRAMUSCULAR | Status: AC
  Filled 2020-02-11: qty 50

## 2020-02-11 MED ORDER — IOHEXOL 300 MG/ML  SOLN
75.0000 mL | Freq: Once | INTRAMUSCULAR | Status: AC | PRN
  Administered 2020-02-11: 75 mL via INTRAVENOUS

## 2020-02-11 NOTE — Telephone Encounter (Signed)
Appt for today @ 1:45. Ottis Stain, CMA

## 2020-02-14 ENCOUNTER — Telehealth: Payer: Self-pay | Admitting: Physician Assistant

## 2020-02-14 ENCOUNTER — Telehealth: Payer: Self-pay | Admitting: Family Medicine

## 2020-02-14 ENCOUNTER — Encounter: Payer: Self-pay | Admitting: *Deleted

## 2020-02-14 DIAGNOSIS — R918 Other nonspecific abnormal finding of lung field: Secondary | ICD-10-CM

## 2020-02-14 NOTE — Telephone Encounter (Signed)
Jessica Beck returned my call and confirmed her appt w/Cassie on 7/19 at 130pm w/labs at 1pm. Pt aware to arrive 15 minutes early.

## 2020-02-14 NOTE — Progress Notes (Signed)
I received a call from Dr. Sandi Carne regarding Ms. Jessica Beck.  She has referred patient to be seen. I updated new patient coordinator to call and schedule her to be seen on 02/21/20.

## 2020-02-14 NOTE — Telephone Encounter (Signed)
Spoke with [facility] RN navigator.  She advised that someone from their office will reach out to patient to schedule appointment, likely for first of next week.  She requests that a PET scan be ordered.    Called scheduling and confirmed number for patient to call to schedule PET scan.  Called patient and given number to scheduled PET scan.  Advised that [facility] will reach out for appointment.  Also advised to f/u with PCP regarding thyroid nodules and atherosclerosis.  [place] Constable, D.O.  PGY-3 Family Medicine  02/14/2020 9:46 AM

## 2020-02-20 NOTE — Progress Notes (Signed)
[name]   [phone]) [phone]  CONSULT NOTE  REFERRING PHYSICIAN: Dr. Sandi Carne  REASON FOR CONSULTATION:  Bilateral pulmonary nodules  HPI Jessica Beck is a 79 y.o. female with a past medical history significant for hypertension, osteoarthritis, IBS, GERD, bowel perforation secondary to appendicitis, migraines, hyperlipidemia, and osteoarthritis is referred to the clinic for evaluation of bilateral pulmonary nodules.  The patient's evaluation began on 02/08/2020 after the patient presented to her PCP for the chief complaint of joint pain, fever, and shortness of breath.  The patient attributed her symptoms to receiving the Shingrix vaccine which was administered approximately 1 month prior to her evaluation.  The patient states that she was experiencing intermittent low-grade fevers with a temperature running between 99 and 100.4.  She also noticed worsening joint pain in her hands bilaterally.  She has a history of osteoarthritis but denies history of rheumatoid arthritis.  She states that the pain in her joints would wax and wane but would interfere with her daily activities such as being able to tie her shoes.  Later on she developed right-sided chest discomfort that was worse with inspiration L as well as a cough.  The symptoms led to her being evaluated on 02/08/2020.  The patient had a chest x-ray performed on 02/09/2020 to rule out pneumonia given her respiratory symptoms and fevers. Her chest x-ray noted a left upper lobe nodule worrisome for carcinoma.  The patient subsequently had a CT scan of the chest which noted bilateral pulmonary masses the largest in the left upper lobe measuring 2.9 cm. There was a necrotic appearing pulmonary nodule in the right upper lobe measuring up to approximately 2 cm. There was a small pulmonary nodule in the right upper lobe measuring approximately 0.9 cm as well as a subpleural pulmonary nodule in the left  lower lobe measuring approximately 6 mm.  The patient was referred to the clinic today for evaluation and a more detailed discussion about her current condition and further recommendations for work-up.  The patient is scheduled for a staging PET scan on 02/23/2020.  Overall, the patient is feeling fair except for coughing spells which occasionally produce thick green phlegm which is worse first in the morning as well as the evening.  She denies any recent fevers.  She denies any night sweats or chills.  The patient states that she has lost approximately 10 pounds over the last few months; however, this is intentional weight loss as the patient has been trying to make dietary changes.  She denies any more chest pain.  She denies hemoptysis.  She reports dyspnea on exertion.  She denies any headache or visual changes.  She denies any nausea, vomiting, or constipation.  The patient has a history of IBS diarrhea subtype.   The patient's family history consists of a mother who had cataracts, hypertension, hyperlipidemia, TIAs, and dementia.  The patient's father had congestive heart failure is and passed away in his 63s.  The patient has 1 sister who had skin cancer.  The patient used to work in [employer] for 20 years.  She is divorced and does not have any children.  She denies any history of drug use.  The patient drinks approximately 1 glass of white wine 5 days a week or so. The patient smoked for approximately 20 years averaging less than 1 pack per day.  HPI  Past Medical History:  Diagnosis Date  . Asthma   . Hypertension   .  IBS (irritable bowel syndrome)   . Migraine 03/07/2017    Past Surgical History:  Procedure Laterality Date  . LAPAROSCOPIC APPENDECTOMY N/A 09/14/2016   Procedure: APPENDECTOMY LAPAROSCOPIC;  Surgeon: Armandina Gemma, MD;  Location: [facility];  Service: General;  Laterality: N/A;    No family history on file.  Social History Social History   Tobacco Use  . Smoking  status: Former Smoker    Quit date: 08/05/1974    Years since quitting: 45.5  . Smokeless tobacco: Never Used  Substance Use Topics  . Alcohol use: Yes  . Drug use: Never    Allergies  Allergen Reactions  . Oxycodone Hcl Hives  . Flexeril [Cyclobenzaprine] Anxiety and Rash    Pt stated, "it made me feel irrational; made me feel not like myself"  . Latex Rash  . Sulfa Antibiotics Other (See Comments)    Not reported reaction  rash    Current Outpatient Medications  Medication Sig Dispense Refill  . aspirin EC 81 MG tablet Take 81 mg by mouth every evening.     . calcium carbonate (OSCAL) 1500 (600 Ca) MG TABS tablet Take 1 tablet by mouth daily.    . Cholecalciferol 50 MCG (2000 UT) CAPS Take 1 capsule by mouth daily.    . magnesium oxide (MAG-OX) 400 MG tablet Take 400 mg by mouth every evening.     Marland Kitchen omeprazole (PRILOSEC) 20 MG capsule TAKE (1) CAPSULE DAILY. 90 capsule 3  . Probiotic Product (ALIGN PO) Take 1 capsule by mouth daily.    Marland Kitchen telmisartan-hydrochlorothiazide (MICARDIS HCT) 80-25 MG tablet TAKE 1 TABLET ONCE DAILY. 90 tablet 3  . traMADol (ULTRAM) 50 MG tablet Take 1 tablet (50 mg total) by mouth 3 (three) times daily as needed for moderate pain or severe pain. 120 tablet 3  . chlorpheniramine-HYDROcodone (TUSSIONEX PENNKINETIC ER) 10-8 MG/5ML SUER Take 5 mLs by mouth at bedtime as needed for cough. (Patient not taking: Reported on 02/21/2020) 70 mL 0  . fluticasone (FLONASE) 50 MCG/ACT nasal spray Place 2 sprays into both nostrils daily. (Patient not taking: Reported on 02/21/2020) 16 g 0  . Polyethyl Glycol-Propyl Glycol (SYSTANE) 0.4-0.3 % SOLN Place 1 drop into both eyes daily as needed (dry eyes).  (Patient not taking: Reported on 02/21/2020)    . PROAIR HFA 108 (90 Base) MCG/ACT inhaler Take 2 puffs by mouth daily as needed for wheezing or shortness of breath.  (Patient not taking: Reported on 02/21/2020)    . triamcinolone cream (KENALOG) 0.1 % Apply 1 application daily  as needed topically. (Patient not taking: Reported on 02/21/2020) 453.6 g 2   No current facility-administered medications for this visit.    Review of Systems  REVIEW OF SYSTEMS:   Review of Systems  Constitutional: Negative for appetite change, chills, fatigue, fever and unexpected weight change.  HENT: Negative for mouth sores, nosebleeds, sore throat and trouble swallowing.   Eyes: Negative for eye problems and icterus.  Respiratory: Positive for cough and dyspnea on exertion. Negative for cough, hemoptysis,  and wheezing.   Cardiovascular: Negative for chest pain and leg swelling.  Gastrointestinal: Negative for abdominal pain, constipation, diarrhea (chronic. None at this time), nausea and vomiting.  Genitourinary: Negative for bladder incontinence, difficulty urinating, dysuria, frequency and hematuria.   Musculoskeletal: Negative for back pain, gait problem, neck pain and neck stiffness.  Skin: Negative for itching and rash.  Neurological: Negative for dizziness, extremity weakness, gait problem, headaches, light-headedness and seizures.  Hematological: Negative for adenopathy.  Does not bruise/bleed easily.  Psychiatric/Behavioral: Negative for confusion, depression and sleep disturbance. The patient is not nervous/anxious.     PHYSICAL EXAMINATION:  Blood pressure 138/85, pulse 100, temperature 97.8 F (36.6 C), temperature source Temporal, resp. rate 18, height 5\' 2"  (1.575 m), weight 151 lb 11.2 oz (68.8 kg), SpO2 97 %.  ECOG PERFORMANCE STATUS: 1  Physical Exam  Constitutional: Oriented to person, place, and time and well-developed, well-nourished, and in no distress.  HENT:  Head: Normocephalic and atraumatic.  Mouth/Throat: Oropharynx is clear and moist. No oropharyngeal exudate.  Eyes: Conjunctivae are normal. Right eye exhibits no discharge. Left eye exhibits no discharge. No scleral icterus.  Neck: Normal range of motion. Neck supple.  Cardiovascular: Normal rate,  regular rhythm, normal heart sounds and intact distal pulses.   Pulmonary/Chest: Effort normal and breath sounds normal. No respiratory distress. No wheezes. No rales.  Abdominal: Soft. Bowel sounds are normal. Exhibits no distension and no mass. There is no tenderness.  Musculoskeletal: Normal range of motion. Exhibits no edema.  Lymphadenopathy:    No cervical adenopathy.  Neurological: Alert and oriented to person, place, and time. Exhibits normal muscle tone. Gait normal. Coordination normal.  Skin: Skin is warm and dry. No rash noted. Not diaphoretic. No erythema. No pallor.  Psychiatric: Mood, memory and judgment normal.  Vitals reviewed.  LABORATORY DATA: Lab Results  Component Value Date   WBC 7.6 02/21/2020   HGB 14.6 02/21/2020   HCT 44.1 02/21/2020   MCV 92.5 02/21/2020   PLT 426 (H) 02/21/2020      Chemistry      Component Value Date/Time   NA 139 02/21/2020 1229   NA 136 02/09/2020 1048   K 3.9 02/21/2020 1229   CL 98 02/21/2020 1229   CO2 28 02/21/2020 1229   BUN 18 02/21/2020 1229   BUN 15 02/09/2020 1048   CREATININE 1.05 (H) 02/21/2020 1229      Component Value Date/Time   CALCIUM 11.2 (H) 02/21/2020 1229   ALKPHOS 102 02/21/2020 1229   AST 18 02/21/2020 1229   ALT 10 02/21/2020 1229   BILITOT 0.7 02/21/2020 1229       RADIOGRAPHIC STUDIES: DG Chest 2 View  Result Date: 02/09/2020 CLINICAL DATA:  Cough and shortness of breath for 1 month. EXAM: CHEST - 2 VIEW COMPARISON:  PA and lateral chest 09/16/2011. FINDINGS: There is a left upper lobe pulmonary nodule measuring 2.7 x 2.1 cm which is new since the prior study. The right lung is clear. Heart size is normal. Aortic atherosclerosis. No pneumothorax or pleural effusion. IMPRESSION: Left upper lobe pulmonary nodule worrisome for carcinoma. Recommend chest CT with contrast for further evaluation. Aortic Atherosclerosis (ICD10-I70.0). These results will be called to the ordering clinician or representative  by the Radiologist Assistant, and communication documented in the PACS or [employer]. Electronically Signed   By: Inge Rise M.D.   On: 02/09/2020 15:56   CT Chest W Contrast  Result Date: 02/11/2020 CLINICAL DATA:  Upper respiratory infection. EXAM: CT CHEST WITH CONTRAST TECHNIQUE: Multidetector CT imaging of the chest was performed during intravenous contrast administration. CONTRAST:  100mL OMNIPAQUE IOHEXOL 300 MG/ML  SOLN COMPARISON:  None. FINDINGS: Cardiovascular: There are atherosclerotic changes of the thoracic aorta without evidence for an aneurysm or dissection. There is no large centrally located pulmonary embolism. The heart size is normal. There are coronary artery calcifications. Mediastinum/Nodes: -- No mediastinal lymphadenopathy. -- No hilar lymphadenopathy. -- No axillary lymphadenopathy. -- No supraclavicular lymphadenopathy. --  the thyroid gland is diffusely enlarged and heterogeneous with multiple thyroid nodules measuring up to approximately 1.8 cm on the left. -  Unremarkable esophagus. Lungs/Pleura: There is a necrotic appearing pulmonary nodule in the right upper lobe measuring up to approximately 2 cm (axial series 7, image 24). There is an additional masslike consolidation in the left upper lobe measuring approximately 2.9 cm (axial series 7, image 54). There is a small pulmonary nodule in the right upper lobe measuring approximately 0.9 cm (axial series 7, image 45). There is a subpleural pulmonary nodule in the left lower lobe measuring approximately 6 mm (axial series 7, image 122). Upper Abdomen: Contrast bolus timing is not optimized for evaluation of the abdominal organs. The visualized portions of the organs of the upper abdomen are normal. Musculoskeletal: No chest wall abnormality. No bony spinal canal stenosis. IMPRESSION: 1. Bilateral pulmonary masses as detailed above, greatest within the left upper lobe. Findings are concerning for metastatic disease or  bronchogenic carcinoma. An atypical appearance of multifocal pneumonia is also within the differential diagnosis but seems less likely. 2. Diffusely enlarged and heterogeneous thyroid gland with thyroid nodules measuring up to approximately 1.8 cm. A nonemergent outpatient thyroid ultrasound is recommended for further evaluation.(Ref: J Am Coll Radiol. 2015 Feb;12(2): 143-50). 3. Coronary artery disease. Aortic Atherosclerosis (ICD10-I70.0). Electronically Signed   By: Constance Holster M.D.   On: 02/11/2020 15:25    ASSESSMENT: This is a very pleasant 79 year old Caucasian female referred to the clinic for evaluation of bilateral pulmonary nodules.  Pending further staging work-up and tissue diagnosis.  First referred to the clinic on July 2021.   PLAN: The patient was seen with Dr. Julien Nordmann.  Dr. Julien Nordmann personally and independently reviewed the images and discussed the results with the patient today.  Dr. Julien Nordmann recommends further evaluation with the patient staging PET scan which is scheduled for 02/23/2020. Once we have the PET scan, if these lesions are hypermetabolic, we will likely refer the patient to pulmonology for consideration of a biopsy. We will also order MRI of the brain.   Her CT scan also noted thyroid nodules. We will be able to assess these if they are hypermetabolic on PET scan and will arrange for a non-emergent ultrasound and FNA if needed of the thyroid nodules.    I will personally call the patient with the results once I have the results from her PET scan to reiterate the plan and to confirm any orders or referrals that I will place.  We will see her back for a follow up visit once the patient has a biopsy.   Her calcium was high today at 11.3. She just started calcium supplements. Dr. Julien Nordmann discussed that she should hold the calcium for now.  The patient voices understanding of current disease status and treatment options and is in agreement with the current care  plan.  All questions were answered. The patient knows to call the clinic with any problems, questions or concerns. We can certainly see the patient much sooner if necessary.  Thank you so much for allowing me to participate in the care of Jessica Beck. I will continue to follow up the patient with you and assist in her care.  The total time spent in the appointment was 60 minutes.  Disclaimer: This note was dictated with voice recognition software. Similar sounding words can inadvertently be transcribed and may not be corrected upon review.   Emma-Lee Oddo L Zeah Germano February 21, 2020, 4:24 PM  ADDENDUM: Hematology/Oncology Attending:  I had a face-to-face encounter with the patient today.  I recommended her care plan.  This is a very pleasant 79 years old white female with history of smoking and past medical history significant for irritable bowel disease, osteoarthritis as well as dyslipidemia.  The patient presented to her primary care physician complaining of fatigue, cough and shortness of breath 1 month after she received Shingrix vaccine.  The patient attributed her symptoms to the vaccine.  During her evaluation she had chest x-ray on 02/09/2020 and it showed left upper lobe pulmonary nodule worrisome for carcinoma.  This was followed by CT scan of the chest on 02/11/2020 and it showed a necrotic appearing pulmonary nodule in the right upper lobe measuring approximately 2.0 cm.  There is an additional masslike consolidation in the left upper lobe measuring 2.9 cm as well as a small pulmonary nodule in the right upper lobe measuring 0.9 cm and subpleural pulmonary nodule in the left lower lobe measuring approximately 0.6 cm.  The thyroid gland also was diffusely enlarged and heterogeneous with multiple thyroid nodules measuring up to 1.8 cm on the left.  There was no mediastinal, axillary, hilar or supraclavicular lymphadenopathy. The patient was referred to me today for evaluation and  recommendation regarding treatment of her condition. I had a lengthy discussion with the patient today about her current condition and treatment options.  I recommended for the patient to have the PET scan performed as planned in few days. If the PET scan is positive and showed hypermetabolic activity in the pulmonary masses and nodules, we will refer the patient to pulmonary medicine for evaluation and consideration of bronchoscopy and biopsy.  We will also consider the patient for MRI of the brain to complete the staging work-up. For the thyroid nodules, she will need ultrasound of the thyroid to rule out any malignancy but will consider this after the PET scan which may provide [place] with more details about her thyroid nodules. We will arrange for the patient to come back for follow-up visit in around 2 weeks for discussion of her biopsy results as well as the staging work-up. The patient was advised to call immediately if she has any concerning symptoms in the interval.  Disclaimer: This note was dictated with voice recognition software. Similar sounding words can inadvertently be transcribed and may be missed upon review. Eilleen Kempf, MD 02/21/20

## 2020-02-21 ENCOUNTER — Other Ambulatory Visit: Payer: Self-pay

## 2020-02-21 ENCOUNTER — Inpatient Hospital Stay: Payer: Medicare Other

## 2020-02-21 ENCOUNTER — Inpatient Hospital Stay: Payer: Medicare Other | Attending: Physician Assistant | Admitting: Physician Assistant

## 2020-02-21 ENCOUNTER — Encounter: Payer: Self-pay | Admitting: Physician Assistant

## 2020-02-21 VITALS — BP 138/85 | HR 100 | Temp 97.8°F | Resp 18 | Ht 62.0 in | Wt 151.7 lb

## 2020-02-21 DIAGNOSIS — E785 Hyperlipidemia, unspecified: Secondary | ICD-10-CM | POA: Insufficient documentation

## 2020-02-21 DIAGNOSIS — R041 Hemorrhage from throat: Secondary | ICD-10-CM

## 2020-02-21 DIAGNOSIS — Z7982 Long term (current) use of aspirin: Secondary | ICD-10-CM | POA: Insufficient documentation

## 2020-02-21 DIAGNOSIS — I2510 Atherosclerotic heart disease of native coronary artery without angina pectoris: Secondary | ICD-10-CM | POA: Diagnosis not present

## 2020-02-21 DIAGNOSIS — K589 Irritable bowel syndrome without diarrhea: Secondary | ICD-10-CM | POA: Diagnosis not present

## 2020-02-21 DIAGNOSIS — Z79899 Other long term (current) drug therapy: Secondary | ICD-10-CM | POA: Insufficient documentation

## 2020-02-21 DIAGNOSIS — R918 Other nonspecific abnormal finding of lung field: Secondary | ICD-10-CM

## 2020-02-21 DIAGNOSIS — M1990 Unspecified osteoarthritis, unspecified site: Secondary | ICD-10-CM | POA: Insufficient documentation

## 2020-02-21 DIAGNOSIS — Z87891 Personal history of nicotine dependence: Secondary | ICD-10-CM | POA: Insufficient documentation

## 2020-02-21 DIAGNOSIS — J45909 Unspecified asthma, uncomplicated: Secondary | ICD-10-CM | POA: Insufficient documentation

## 2020-02-21 DIAGNOSIS — Z7189 Other specified counseling: Secondary | ICD-10-CM

## 2020-02-21 DIAGNOSIS — R5383 Other fatigue: Secondary | ICD-10-CM | POA: Diagnosis not present

## 2020-02-21 DIAGNOSIS — K219 Gastro-esophageal reflux disease without esophagitis: Secondary | ICD-10-CM | POA: Insufficient documentation

## 2020-02-21 DIAGNOSIS — I10 Essential (primary) hypertension: Secondary | ICD-10-CM | POA: Diagnosis not present

## 2020-02-21 LAB — CBC WITH DIFFERENTIAL (CANCER CENTER ONLY)
Abs Immature Granulocytes: 0.02 10*3/uL (ref 0.00–0.07)
Basophils Absolute: 0 10*3/uL (ref 0.0–0.1)
Basophils Relative: 1 %
Eosinophils Absolute: 0.1 10*3/uL (ref 0.0–0.5)
Eosinophils Relative: 1 %
HCT: 44.1 % (ref 36.0–46.0)
Hemoglobin: 14.6 g/dL (ref 12.0–15.0)
Immature Granulocytes: 0 %
Lymphocytes Relative: 36 %
Lymphs Abs: 2.8 10*3/uL (ref 0.7–4.0)
MCH: 30.6 pg (ref 26.0–34.0)
MCHC: 33.1 g/dL (ref 30.0–36.0)
MCV: 92.5 fL (ref 80.0–100.0)
Monocytes Absolute: 0.5 10*3/uL (ref 0.1–1.0)
Monocytes Relative: 6 %
Neutro Abs: 4.3 10*3/uL (ref 1.7–7.7)
Neutrophils Relative %: 56 %
Platelet Count: 426 10*3/uL — ABNORMAL HIGH (ref 150–400)
RBC: 4.77 MIL/uL (ref 3.87–5.11)
RDW: 12.2 % (ref 11.5–15.5)
WBC Count: 7.6 10*3/uL (ref 4.0–10.5)
nRBC: 0 % (ref 0.0–0.2)

## 2020-02-21 LAB — CMP (CANCER CENTER ONLY)
ALT: 10 U/L (ref 0–44)
AST: 18 U/L (ref 15–41)
Albumin: 3.8 g/dL (ref 3.5–5.0)
Alkaline Phosphatase: 102 U/L (ref 38–126)
Anion gap: 13 (ref 5–15)
BUN: 18 mg/dL (ref 8–23)
CO2: 28 mmol/L (ref 22–32)
Calcium: 11.2 mg/dL — ABNORMAL HIGH (ref 8.9–10.3)
Chloride: 98 mmol/L (ref 98–111)
Creatinine: 1.05 mg/dL — ABNORMAL HIGH (ref 0.44–1.00)
GFR, Est AFR Am: 59 mL/min — ABNORMAL LOW (ref >60)
GFR, Estimated: 51 mL/min — ABNORMAL LOW (ref >60)
Glucose, Bld: 108 mg/dL — ABNORMAL HIGH (ref 70–99)
Potassium: 3.9 mmol/L (ref 3.5–5.1)
Sodium: 139 mmol/L (ref 135–145)
Total Bilirubin: 0.7 mg/dL (ref 0.3–1.2)
Total Protein: 8 g/dL (ref 6.5–8.1)

## 2020-02-22 ENCOUNTER — Other Ambulatory Visit (HOSPITAL_COMMUNITY): Payer: Medicare Other

## 2020-02-23 ENCOUNTER — Other Ambulatory Visit: Payer: Self-pay

## 2020-02-23 ENCOUNTER — Ambulatory Visit (HOSPITAL_COMMUNITY)
Admission: RE | Admit: 2020-02-23 | Discharge: 2020-02-23 | Disposition: A | Discharge status: Home / Self Care | Payer: Medicare Other | Source: Ambulatory Visit | Attending: Family Medicine | Admitting: Family Medicine

## 2020-02-23 DIAGNOSIS — K8020 Calculus of gallbladder without cholecystitis without obstruction: Secondary | ICD-10-CM | POA: Insufficient documentation

## 2020-02-23 DIAGNOSIS — R918 Other nonspecific abnormal finding of lung field: Secondary | ICD-10-CM | POA: Insufficient documentation

## 2020-02-23 DIAGNOSIS — I700 Atherosclerosis of aorta: Secondary | ICD-10-CM | POA: Insufficient documentation

## 2020-02-23 DIAGNOSIS — D1809 Hemangioma of other sites: Secondary | ICD-10-CM | POA: Diagnosis not present

## 2020-02-23 DIAGNOSIS — I2510 Atherosclerotic heart disease of native coronary artery without angina pectoris: Secondary | ICD-10-CM | POA: Diagnosis not present

## 2020-02-23 LAB — GLUCOSE, CAPILLARY: Glucose-Capillary: 106 mg/dL — ABNORMAL HIGH (ref 70–99)

## 2020-02-23 MED ORDER — FLUDEOXYGLUCOSE F - 18 (FDG) INJECTION
7.4400 | Freq: Once | INTRAVENOUS | Status: AC | PRN
  Administered 2020-02-23: 7.44 via INTRAVENOUS

## 2020-02-24 ENCOUNTER — Other Ambulatory Visit: Payer: Self-pay | Admitting: Physician Assistant

## 2020-02-24 ENCOUNTER — Telehealth: Payer: Self-pay | Admitting: Physician Assistant

## 2020-02-24 DIAGNOSIS — R918 Other nonspecific abnormal finding of lung field: Secondary | ICD-10-CM

## 2020-02-24 NOTE — Telephone Encounter (Signed)
Called the patient about the PET scan results. I let her know that I was placing a referral to [facility] for consideration of a biopsy. She had several questions regarding why a biopsy was necessary. I discussed that it is important for [place] to confirm the diagnosis with tissue so we best can come up with a management plan. I also placed an order for a non-emergent ultrasound of the thyroid to evaluate the multiple nodules seen on the CT scan. I will place an order for an MRI of the brain after she has had a biopsy to confirm the diagnosis. Advised her to call us if she has not received an appointment/call with pulmonology.

## 2020-02-28 ENCOUNTER — Telehealth: Payer: Self-pay | Admitting: Family Medicine

## 2020-02-28 NOTE — Telephone Encounter (Signed)
Patient canceled appointment for today with me. She has 2 bilateral hypermetabolic lung nodules suspicious for malignancy that require biopsy for diagnosis. She has been referred to [facility] pulmonology to schedule biopsy. I discussed her case with Dr. Valeta Harms this morning, they are happy to see her, could possibly see her end of this week. I left a HIPAA compliant VM, would like to discuss this further with her and connect with [facility] pulm as soon as possible.  Gladys Damme, MD [facility], PGY-2

## 2020-02-29 ENCOUNTER — Ambulatory Visit: Payer: Medicare Other | Admitting: Family Medicine

## 2020-03-01 NOTE — Telephone Encounter (Signed)
Patient calls nurse line returning PCP call. Patient reports you can call her back at any time.

## 2020-03-03 ENCOUNTER — Telehealth: Payer: Self-pay | Admitting: Family Medicine

## 2020-03-03 NOTE — Telephone Encounter (Signed)
Discussed with patient regarding lung pulmonology appointment on 8/4. Patient is understandably apprehensive, gave her reassurance. Dr. Valeta Harms graciously scheduled double-booked appt, they will get her in ASAP for bronch and bx. Appreciate their assistance in coordinating care. Brain MRI to be ordered pending diagnosis after bronchoscopy. Will continue to follow along with Oncology and Pulmonology.  Gladys Damme, MD [facility], PGY-2

## 2020-03-06 ENCOUNTER — Telehealth: Payer: Self-pay | Admitting: *Deleted

## 2020-03-06 NOTE — Telephone Encounter (Signed)
I called Jessica Beck to follow up on her questions regarding the bx.  I spoke to her and clarified her questions.  She is set up with Dr. Valeta Harms this week for consultation.

## 2020-03-08 ENCOUNTER — Ambulatory Visit (INDEPENDENT_AMBULATORY_CARE_PROVIDER_SITE_OTHER): Payer: Medicare Other | Admitting: Pulmonary Disease

## 2020-03-08 ENCOUNTER — Other Ambulatory Visit: Payer: Self-pay

## 2020-03-08 ENCOUNTER — Encounter: Payer: Self-pay | Admitting: Pulmonary Disease

## 2020-03-08 ENCOUNTER — Telehealth: Payer: Self-pay | Admitting: *Deleted

## 2020-03-08 VITALS — BP 132/78 | HR 100 | Temp 98.1°F | Resp 16 | Ht 62.0 in | Wt 151.4 lb

## 2020-03-08 DIAGNOSIS — R942 Abnormal results of pulmonary function studies: Secondary | ICD-10-CM

## 2020-03-08 DIAGNOSIS — R911 Solitary pulmonary nodule: Secondary | ICD-10-CM | POA: Diagnosis not present

## 2020-03-08 NOTE — Progress Notes (Signed)
Synopsis: Referred in August 2021 for abnormal PET scan by Gladys Damme, MD  Subjective:   PATIENT ID: Jessica Beck GENDER: female DOB: 09/22/1940, MRN: [id number]  Chief Complaint  Patient presents with  . Lung Lesion    States she has low energy and not sleeping well. Coughing and 16yr smoking history.    This is a 79 year old female past medical history of hypertension, asthma.  Had CT imaging which revealed bilateral lung nodules follow-up by PET scan imaging which revealed hypermetabolic uptake within both pulmonary nodules.  Decision was made for referred to pulmonary medicine for evaluation of biopsy.  Patient was seen in the oncology office on 02/21/2020.  Other history includes osteoarthritis, IBS, GERD and a prior bowel perforation secondary to an appendicitis.  Patient is a former smoker who quit in 1976 with an approximate 45-pack-year history.  She does have occasional alcohol use.  OV 03/08/2020: Patient here today for discussion regarding bronchoscopy and abnormal pet imaging.  We discussed images today in the office as well as the risk benefits and alternatives of proceeding with procedure.  Patient has no other significant respiratory complaints at this time.   Past Medical History:  Diagnosis Date  . Asthma   . Hypertension   . IBS (irritable bowel syndrome)   . Migraine 03/07/2017     No family history on file.   Past Surgical History:  Procedure Laterality Date  . HIP RESECTION ARTHROPLASTY    . LAPAROSCOPIC APPENDECTOMY N/A 09/14/2016   Procedure: APPENDECTOMY LAPAROSCOPIC;  Surgeon: Armandina Gemma, MD;  Location: [facility];  Service: General;  Laterality: N/A;    Social History   Socioeconomic History  . Marital status: Single    Spouse name: Not on file  . Number of children: Not on file  . Years of education: Not on file  . Highest education level: Not on file  Occupational History  . Not on file  Tobacco Use  . Smoking status: Former Smoker    Quit  date: 08/05/1974    Years since quitting: 45.6  . Smokeless tobacco: Never Used  Substance and Sexual Activity  . Alcohol use: Yes  . Drug use: Never  . Sexual activity: Not on file  Other Topics Concern  . Not on file  Social History Narrative  . Not on file   Social Determinants of Health   Financial Resource Strain:   . Difficulty of Paying Living Expenses:   Food Insecurity:   . Worried About Charity fundraiser in the Last Year:   . Arboriculturist in the Last Year:   Transportation Needs:   . Film/video editor (Medical):   Marland Kitchen Lack of Transportation (Non-Medical):   Physical Activity:   . Days of Exercise per Week:   . Minutes of Exercise per Session:   Stress:   . Feeling of Stress :   Social Connections:   . Frequency of Communication with Friends and Family:   . Frequency of Social Gatherings with Friends and Family:   . Attends Religious Services:   . Active Member of Clubs or Organizations:   . Attends Archivist Meetings:   Marland Kitchen Marital Status:   Intimate Partner Violence:   . Fear of Current or Ex-Partner:   . Emotionally Abused:   Marland Kitchen Physically Abused:   . Sexually Abused:      Allergies  Allergen Reactions  . Oxycodone Hcl Hives  . Flexeril [Cyclobenzaprine] Anxiety and Rash    Pt  stated, "it made me feel irrational; made me feel not like myself"  . Latex Rash  . Sulfa Antibiotics Other (See Comments)    Not reported reaction  rash     Outpatient Medications Prior to Visit  Medication Sig Dispense Refill  . acetaminophen (TYLENOL) 500 MG tablet Take 1,000 mg by mouth every 6 (six) hours as needed.    Marland Kitchen aspirin EC 81 MG tablet Take 81 mg by mouth every evening.     . Cholecalciferol 50 MCG (2000 UT) CAPS Take 1 capsule by mouth daily.    . fluticasone (FLONASE) 50 MCG/ACT nasal spray Place 2 sprays into both nostrils daily. 16 g 0  . magnesium oxide (MAG-OX) 400 MG tablet Take 400 mg by mouth every evening.     Marland Kitchen omeprazole (PRILOSEC) 20  MG capsule TAKE (1) CAPSULE DAILY. 90 capsule 3  . Polyethyl Glycol-Propyl Glycol (SYSTANE) 0.4-0.3 % SOLN Place 1 drop into both eyes daily as needed (dry eyes).     Marland Kitchen PROAIR HFA 108 (90 Base) MCG/ACT inhaler Take 2 puffs by mouth daily as needed for wheezing or shortness of breath.     . Probiotic Product (ALIGN PO) Take 1 capsule by mouth daily.    Marland Kitchen telmisartan-hydrochlorothiazide (MICARDIS HCT) 80-25 MG tablet TAKE 1 TABLET ONCE DAILY. 90 tablet 3  . traMADol (ULTRAM) 50 MG tablet Take 1 tablet (50 mg total) by mouth 3 (three) times daily as needed for moderate pain or severe pain. 120 tablet 3  . triamcinolone cream (KENALOG) 0.1 % Apply 1 application daily as needed topically. 453.6 g 2  . calcium carbonate (OSCAL) 1500 (600 Ca) MG TABS tablet Take 1 tablet by mouth daily. (Patient not taking: Reported on 03/08/2020)    . chlorpheniramine-HYDROcodone (TUSSIONEX PENNKINETIC ER) 10-8 MG/5ML SUER Take 5 mLs by mouth at bedtime as needed for cough. (Patient not taking: Reported on 03/08/2020) 70 mL 0   No facility-administered medications prior to visit.    Review of Systems  Constitutional: Negative for chills, fever, malaise/fatigue and weight loss.  HENT: Negative for hearing loss, sore throat and tinnitus.   Eyes: Negative for blurred vision and double vision.  Respiratory: Negative for cough, hemoptysis, sputum production, shortness of breath, wheezing and stridor.   Cardiovascular: Negative for chest pain, palpitations, orthopnea, leg swelling and PND.  Gastrointestinal: Negative for abdominal pain, constipation, diarrhea, heartburn, nausea and vomiting.  Genitourinary: Negative for dysuria, hematuria and urgency.  Musculoskeletal: Negative for joint pain and myalgias.  Skin: Negative for itching and rash.  Neurological: Negative for dizziness, tingling, weakness and headaches.  Endo/Heme/Allergies: Negative for environmental allergies. Does not bruise/bleed easily.    Psychiatric/Behavioral: Negative for depression. The patient is not nervous/anxious and does not have insomnia.   All other systems reviewed and are negative.    Objective:  Physical Exam Vitals reviewed.  Constitutional:      General: She is not in acute distress.    Appearance: She is well-developed.  HENT:     Head: Normocephalic and atraumatic.  Eyes:     General: No scleral icterus.    Conjunctiva/sclera: Conjunctivae normal.     Pupils: Pupils are equal, round, and reactive to light.  Neck:     Vascular: No JVD.     Trachea: No tracheal deviation.  Cardiovascular:     Rate and Rhythm: Normal rate and regular rhythm.     Heart sounds: Normal heart sounds. No murmur heard.   Pulmonary:     Effort:  Pulmonary effort is normal. No tachypnea, accessory muscle usage or respiratory distress.     Breath sounds: Normal breath sounds. No stridor. No wheezing, rhonchi or rales.  Abdominal:     General: Bowel sounds are normal. There is no distension.     Palpations: Abdomen is soft.     Tenderness: There is no abdominal tenderness.  Musculoskeletal:        General: No tenderness.     Cervical back: Neck supple.  Lymphadenopathy:     Cervical: No cervical adenopathy.  Skin:    General: Skin is warm and dry.     Capillary Refill: Capillary refill takes less than 2 seconds.     Findings: No rash.  Neurological:     Mental Status: She is alert and oriented to person, place, and time.  Psychiatric:        Behavior: Behavior normal.      Vitals:   03/08/20 0900  BP: 132/78  Pulse: 100  Resp: 16  Temp: 98.1 F (36.7 C)  TempSrc: Oral  SpO2: 96%  Weight: 151 lb 6.4 oz (68.7 kg)  Height: 5\' 2"  (1.575 m)   96% on RA BMI Readings from Last 3 Encounters:  03/08/20 27.69 kg/m  02/21/20 27.75 kg/m  02/08/20 29.68 kg/m   Wt Readings from Last 3 Encounters:  03/08/20 151 lb 6.4 oz (68.7 kg)  02/21/20 151 lb 11.2 oz (68.8 kg)  11/08/19 162 lb 4 oz (73.6 kg)      CBC    Component Value Date/Time   WBC 7.6 02/21/2020 1229   WBC 5.5 10/25/2017 0636   RBC 4.77 02/21/2020 1229   HGB 14.6 02/21/2020 1229   HGB 14.1 02/09/2020 1048   HCT 44.1 02/21/2020 1229   HCT 41.2 02/09/2020 1048   PLT 426 (H) 02/21/2020 1229   PLT 393 02/09/2020 1048   MCV 92.5 02/21/2020 1229   MCV 91 02/09/2020 1048   MCH 30.6 02/21/2020 1229   MCHC 33.1 02/21/2020 1229   RDW 12.2 02/21/2020 1229   RDW 11.7 02/09/2020 1048   LYMPHSABS 2.8 02/21/2020 1229   LYMPHSABS 2.8 02/09/2020 1048   MONOABS 0.5 02/21/2020 1229   EOSABS 0.1 02/21/2020 1229   EOSABS 0.2 02/09/2020 1048   BASOSABS 0.0 02/21/2020 1229   BASOSABS 0.1 02/09/2020 1048      Chest Imaging: 02/11/2020: Chest CT Bilateral pulmonary nodules right upper lobe 2 cm left upper lobe 2.9 cm in largest cross-section. The patient's images have been independently reviewed by me.    02/23/2020: Nuclear medicine PET imaging. Right upper lobe lesion with SUV max 7.6, left upper lobe lesion SUV max 10.8. The patient's images have been independently reviewed by me.   Pulmonary Functions Testing Results: No flowsheet data found.  FeNO: None   Pathology: None  Echocardiogram: None   Heart Catheterization: None     Assessment & Plan:     ICD-10-CM   1. Nodule of upper lobe of right lung  R91.1 Ambulatory referral to Pulmonology  2. Nodule of upper lobe of left lung  R91.1 Ambulatory referral to Pulmonology  3. Abnormal PET scan, lung  R94.2     Discussion:  This is a 79 year old female former smoker with abnormal CT imaging and abnormal PET scan imaging of the lung concerning for potential metachronous primary bronchogenic carcinomas.  She does have a right upper lobe lesion and a left upper lobe lesion both concerning for malignancy.  Patient's case was discussed with patient's primary care  doctor Dr. Chauncey Reading as well as oncology Cassandra Heilingoetter, PA from medical oncology.  Today in the  office we discussed the risk benefits and alternatives of proceeding with tissue biopsy to confirm malignancy. The patient is in agreements.  We discussed the alternatives to include percutaneous approach versus video bronchoscopy with endobronchial navigation. Risk we discussed to include bleeding, pneumothorax and potential death related to any procedure requiring general anesthesia. We will plan for navigational bronchoscopy to be completed at [place].    Current Outpatient Medications:  .  acetaminophen (TYLENOL) 500 MG tablet, Take 1,000 mg by mouth every 6 (six) hours as needed., Disp: , Rfl:  .  aspirin EC 81 MG tablet, Take 81 mg by mouth every evening. , Disp: , Rfl:  .  Cholecalciferol 50 MCG (2000 UT) CAPS, Take 1 capsule by mouth daily., Disp: , Rfl:  .  fluticasone (FLONASE) 50 MCG/ACT nasal spray, Place 2 sprays into both nostrils daily., Disp: 16 g, Rfl: 0 .  magnesium oxide (MAG-OX) 400 MG tablet, Take 400 mg by mouth every evening. , Disp: , Rfl:  .  omeprazole (PRILOSEC) 20 MG capsule, TAKE (1) CAPSULE DAILY., Disp: 90 capsule, Rfl: 3 .  Polyethyl Glycol-Propyl Glycol (SYSTANE) 0.4-0.3 % SOLN, Place 1 drop into both eyes daily as needed (dry eyes). , Disp: , Rfl:  .  PROAIR HFA 108 (90 Base) MCG/ACT inhaler, Take 2 puffs by mouth daily as needed for wheezing or shortness of breath. , Disp: , Rfl:  .  Probiotic Product (ALIGN PO), Take 1 capsule by mouth daily., Disp: , Rfl:  .  telmisartan-hydrochlorothiazide (MICARDIS HCT) 80-25 MG tablet, TAKE 1 TABLET ONCE DAILY., Disp: 90 tablet, Rfl: 3 .  traMADol (ULTRAM) 50 MG tablet, Take 1 tablet (50 mg total) by mouth 3 (three) times daily as needed for moderate pain or severe pain., Disp: 120 tablet, Rfl: 3 .  triamcinolone cream (KENALOG) 0.1 %, Apply 1 application daily as needed topically., Disp: 453.6 g, Rfl: 2 .  calcium carbonate (OSCAL) 1500 (600 Ca) MG TABS tablet, Take 1 tablet by mouth daily. (Patient not taking:  Reported on 03/08/2020), Disp: , Rfl:  .  chlorpheniramine-HYDROcodone (TUSSIONEX PENNKINETIC ER) 10-8 MG/5ML SUER, Take 5 mLs by mouth at bedtime as needed for cough. (Patient not taking: Reported on 03/08/2020), Disp: 70 mL, Rfl: 0   Garner Nash, DO [facility] 03/08/2020 11:40 AM

## 2020-03-08 NOTE — Telephone Encounter (Signed)
-----   Message from Joellen Jersey sent at 03/08/2020 10:24 AM EDT ----- Regarding: enb Pt is scheduled for [facility] 03/21/20@8 :15am covid test 03/18/20@11 :00am and disk will be in basket at [facility]

## 2020-03-08 NOTE — H&P (View-Only) (Signed)
Synopsis: Referred in August 2021 for abnormal PET scan by Gladys Damme, MD  Subjective:   PATIENT ID: Jessica Beck GENDER: female DOB: Dec 11, 1940, MRN: [id number]  Chief Complaint  Patient presents with  . Lung Lesion    States she has low energy and not sleeping well. Coughing and 78yr smoking history.    This is a 79 year old female past medical history of hypertension, asthma.  Had CT imaging which revealed bilateral lung nodules follow-up by PET scan imaging which revealed hypermetabolic uptake within both pulmonary nodules.  Decision was made for referred to pulmonary medicine for evaluation of biopsy.  Patient was seen in the oncology office on 02/21/2020.  Other history includes osteoarthritis, IBS, GERD and a prior bowel perforation secondary to an appendicitis.  Patient is a former smoker who quit in 1976 with an approximate 45-pack-year history.  She does have occasional alcohol use.  OV 03/08/2020: Patient here today for discussion regarding bronchoscopy and abnormal pet imaging.  We discussed images today in the office as well as the risk benefits and alternatives of proceeding with procedure.  Patient has no other significant respiratory complaints at this time.   Past Medical History:  Diagnosis Date  . Asthma   . Hypertension   . IBS (irritable bowel syndrome)   . Migraine 03/07/2017     No family history on file.   Past Surgical History:  Procedure Laterality Date  . HIP RESECTION ARTHROPLASTY    . LAPAROSCOPIC APPENDECTOMY N/A 09/14/2016   Procedure: APPENDECTOMY LAPAROSCOPIC;  Surgeon: Armandina Gemma, MD;  Location: [facility];  Service: General;  Laterality: N/A;    Social History   Socioeconomic History  . Marital status: Single    Spouse name: Not on file  . Number of children: Not on file  . Years of education: Not on file  . Highest education level: Not on file  Occupational History  . Not on file  Tobacco Use  . Smoking status: Former Smoker    Quit  date: 08/05/1974    Years since quitting: 45.6  . Smokeless tobacco: Never Used  Substance and Sexual Activity  . Alcohol use: Yes  . Drug use: Never  . Sexual activity: Not on file  Other Topics Concern  . Not on file  Social History Narrative  . Not on file   Social Determinants of Health   Financial Resource Strain:   . Difficulty of Paying Living Expenses:   Food Insecurity:   . Worried About Charity fundraiser in the Last Year:   . Arboriculturist in the Last Year:   Transportation Needs:   . Film/video editor (Medical):   Marland Kitchen Lack of Transportation (Non-Medical):   Physical Activity:   . Days of Exercise per Week:   . Minutes of Exercise per Session:   Stress:   . Feeling of Stress :   Social Connections:   . Frequency of Communication with Friends and Family:   . Frequency of Social Gatherings with Friends and Family:   . Attends Religious Services:   . Active Member of Clubs or Organizations:   . Attends Archivist Meetings:   Marland Kitchen Marital Status:   Intimate Partner Violence:   . Fear of Current or Ex-Partner:   . Emotionally Abused:   Marland Kitchen Physically Abused:   . Sexually Abused:      Allergies  Allergen Reactions  . Oxycodone Hcl Hives  . Flexeril [Cyclobenzaprine] Anxiety and Rash    Pt  stated, "it made me feel irrational; made me feel not like myself"  . Latex Rash  . Sulfa Antibiotics Other (See Comments)    Not reported reaction  rash     Outpatient Medications Prior to Visit  Medication Sig Dispense Refill  . acetaminophen (TYLENOL) 500 MG tablet Take 1,000 mg by mouth every 6 (six) hours as needed.    Marland Kitchen aspirin EC 81 MG tablet Take 81 mg by mouth every evening.     . Cholecalciferol 50 MCG (2000 UT) CAPS Take 1 capsule by mouth daily.    . fluticasone (FLONASE) 50 MCG/ACT nasal spray Place 2 sprays into both nostrils daily. 16 g 0  . magnesium oxide (MAG-OX) 400 MG tablet Take 400 mg by mouth every evening.     Marland Kitchen omeprazole (PRILOSEC) 20  MG capsule TAKE (1) CAPSULE DAILY. 90 capsule 3  . Polyethyl Glycol-Propyl Glycol (SYSTANE) 0.4-0.3 % SOLN Place 1 drop into both eyes daily as needed (dry eyes).     Marland Kitchen PROAIR HFA 108 (90 Base) MCG/ACT inhaler Take 2 puffs by mouth daily as needed for wheezing or shortness of breath.     . Probiotic Product (ALIGN PO) Take 1 capsule by mouth daily.    Marland Kitchen telmisartan-hydrochlorothiazide (MICARDIS HCT) 80-25 MG tablet TAKE 1 TABLET ONCE DAILY. 90 tablet 3  . traMADol (ULTRAM) 50 MG tablet Take 1 tablet (50 mg total) by mouth 3 (three) times daily as needed for moderate pain or severe pain. 120 tablet 3  . triamcinolone cream (KENALOG) 0.1 % Apply 1 application daily as needed topically. 453.6 g 2  . calcium carbonate (OSCAL) 1500 (600 Ca) MG TABS tablet Take 1 tablet by mouth daily. (Patient not taking: Reported on 03/08/2020)    . chlorpheniramine-HYDROcodone (TUSSIONEX PENNKINETIC ER) 10-8 MG/5ML SUER Take 5 mLs by mouth at bedtime as needed for cough. (Patient not taking: Reported on 03/08/2020) 70 mL 0   No facility-administered medications prior to visit.    Review of Systems  Constitutional: Negative for chills, fever, malaise/fatigue and weight loss.  HENT: Negative for hearing loss, sore throat and tinnitus.   Eyes: Negative for blurred vision and double vision.  Respiratory: Negative for cough, hemoptysis, sputum production, shortness of breath, wheezing and stridor.   Cardiovascular: Negative for chest pain, palpitations, orthopnea, leg swelling and PND.  Gastrointestinal: Negative for abdominal pain, constipation, diarrhea, heartburn, nausea and vomiting.  Genitourinary: Negative for dysuria, hematuria and urgency.  Musculoskeletal: Negative for joint pain and myalgias.  Skin: Negative for itching and rash.  Neurological: Negative for dizziness, tingling, weakness and headaches.  Endo/Heme/Allergies: Negative for environmental allergies. Does not bruise/bleed easily.    Psychiatric/Behavioral: Negative for depression. The patient is not nervous/anxious and does not have insomnia.   All other systems reviewed and are negative.    Objective:  Physical Exam Vitals reviewed.  Constitutional:      General: She is not in acute distress.    Appearance: She is well-developed.  HENT:     Head: Normocephalic and atraumatic.  Eyes:     General: No scleral icterus.    Conjunctiva/sclera: Conjunctivae normal.     Pupils: Pupils are equal, round, and reactive to light.  Neck:     Vascular: No JVD.     Trachea: No tracheal deviation.  Cardiovascular:     Rate and Rhythm: Normal rate and regular rhythm.     Heart sounds: Normal heart sounds. No murmur heard.   Pulmonary:     Effort:  Pulmonary effort is normal. No tachypnea, accessory muscle usage or respiratory distress.     Breath sounds: Normal breath sounds. No stridor. No wheezing, rhonchi or rales.  Abdominal:     General: Bowel sounds are normal. There is no distension.     Palpations: Abdomen is soft.     Tenderness: There is no abdominal tenderness.  Musculoskeletal:        General: No tenderness.     Cervical back: Neck supple.  Lymphadenopathy:     Cervical: No cervical adenopathy.  Skin:    General: Skin is warm and dry.     Capillary Refill: Capillary refill takes less than 2 seconds.     Findings: No rash.  Neurological:     Mental Status: She is alert and oriented to person, place, and time.  Psychiatric:        Behavior: Behavior normal.      Vitals:   03/08/20 0900  BP: 132/78  Pulse: 100  Resp: 16  Temp: 98.1 F (36.7 C)  TempSrc: Oral  SpO2: 96%  Weight: 151 lb 6.4 oz (68.7 kg)  Height: 5\' 2"  (1.575 m)   96% on RA BMI Readings from Last 3 Encounters:  03/08/20 27.69 kg/m  02/21/20 27.75 kg/m  02/08/20 29.68 kg/m   Wt Readings from Last 3 Encounters:  03/08/20 151 lb 6.4 oz (68.7 kg)  02/21/20 151 lb 11.2 oz (68.8 kg)  11/08/19 162 lb 4 oz (73.6 kg)      CBC    Component Value Date/Time   WBC 7.6 02/21/2020 1229   WBC 5.5 10/25/2017 0636   RBC 4.77 02/21/2020 1229   HGB 14.6 02/21/2020 1229   HGB 14.1 02/09/2020 1048   HCT 44.1 02/21/2020 1229   HCT 41.2 02/09/2020 1048   PLT 426 (H) 02/21/2020 1229   PLT 393 02/09/2020 1048   MCV 92.5 02/21/2020 1229   MCV 91 02/09/2020 1048   MCH 30.6 02/21/2020 1229   MCHC 33.1 02/21/2020 1229   RDW 12.2 02/21/2020 1229   RDW 11.7 02/09/2020 1048   LYMPHSABS 2.8 02/21/2020 1229   LYMPHSABS 2.8 02/09/2020 1048   MONOABS 0.5 02/21/2020 1229   EOSABS 0.1 02/21/2020 1229   EOSABS 0.2 02/09/2020 1048   BASOSABS 0.0 02/21/2020 1229   BASOSABS 0.1 02/09/2020 1048      Chest Imaging: 02/11/2020: Chest CT Bilateral pulmonary nodules right upper lobe 2 cm left upper lobe 2.9 cm in largest cross-section. The patient's images have been independently reviewed by me.    02/23/2020: Nuclear medicine PET imaging. Right upper lobe lesion with SUV max 7.6, left upper lobe lesion SUV max 10.8. The patient's images have been independently reviewed by me.   Pulmonary Functions Testing Results: No flowsheet data found.  FeNO: None   Pathology: None  Echocardiogram: None   Heart Catheterization: None     Assessment & Plan:     ICD-10-CM   1. Nodule of upper lobe of right lung  R91.1 Ambulatory referral to Pulmonology  2. Nodule of upper lobe of left lung  R91.1 Ambulatory referral to Pulmonology  3. Abnormal PET scan, lung  R94.2     Discussion:  This is a 79 year old female former smoker with abnormal CT imaging and abnormal PET scan imaging of the lung concerning for potential metachronous primary bronchogenic carcinomas.  She does have a right upper lobe lesion and a left upper lobe lesion both concerning for malignancy.  Patient's case was discussed with patient's primary care  doctor Dr. Chauncey Reading as well as oncology Cassandra Heilingoetter, PA from medical oncology.  Today in the  office we discussed the risk benefits and alternatives of proceeding with tissue biopsy to confirm malignancy. The patient is in agreements.  We discussed the alternatives to include percutaneous approach versus video bronchoscopy with endobronchial navigation. Risk we discussed to include bleeding, pneumothorax and potential death related to any procedure requiring general anesthesia. We will plan for navigational bronchoscopy to be completed at [place].    Current Outpatient Medications:  .  acetaminophen (TYLENOL) 500 MG tablet, Take 1,000 mg by mouth every 6 (six) hours as needed., Disp: , Rfl:  .  aspirin EC 81 MG tablet, Take 81 mg by mouth every evening. , Disp: , Rfl:  .  Cholecalciferol 50 MCG (2000 UT) CAPS, Take 1 capsule by mouth daily., Disp: , Rfl:  .  fluticasone (FLONASE) 50 MCG/ACT nasal spray, Place 2 sprays into both nostrils daily., Disp: 16 g, Rfl: 0 .  magnesium oxide (MAG-OX) 400 MG tablet, Take 400 mg by mouth every evening. , Disp: , Rfl:  .  omeprazole (PRILOSEC) 20 MG capsule, TAKE (1) CAPSULE DAILY., Disp: 90 capsule, Rfl: 3 .  Polyethyl Glycol-Propyl Glycol (SYSTANE) 0.4-0.3 % SOLN, Place 1 drop into both eyes daily as needed (dry eyes). , Disp: , Rfl:  .  PROAIR HFA 108 (90 Base) MCG/ACT inhaler, Take 2 puffs by mouth daily as needed for wheezing or shortness of breath. , Disp: , Rfl:  .  Probiotic Product (ALIGN PO), Take 1 capsule by mouth daily., Disp: , Rfl:  .  telmisartan-hydrochlorothiazide (MICARDIS HCT) 80-25 MG tablet, TAKE 1 TABLET ONCE DAILY., Disp: 90 tablet, Rfl: 3 .  traMADol (ULTRAM) 50 MG tablet, Take 1 tablet (50 mg total) by mouth 3 (three) times daily as needed for moderate pain or severe pain., Disp: 120 tablet, Rfl: 3 .  triamcinolone cream (KENALOG) 0.1 %, Apply 1 application daily as needed topically., Disp: 453.6 g, Rfl: 2 .  calcium carbonate (OSCAL) 1500 (600 Ca) MG TABS tablet, Take 1 tablet by mouth daily. (Patient not taking:  Reported on 03/08/2020), Disp: , Rfl:  .  chlorpheniramine-HYDROcodone (TUSSIONEX PENNKINETIC ER) 10-8 MG/5ML SUER, Take 5 mLs by mouth at bedtime as needed for cough. (Patient not taking: Reported on 03/08/2020), Disp: 70 mL, Rfl: 0   Garner Nash, DO [facility] 03/08/2020 11:40 AM

## 2020-03-08 NOTE — Patient Instructions (Addendum)
Thank you for visiting Dr. Valeta Harms at [facility]. Today we recommend the following:  Orders Placed This Encounter  Procedures  . Ambulatory referral to Pulmonology   Planned procedure on 03/21/2020 You will receive a call from [place] admissions   Return in about 6 weeks (around 04/19/2020) for with APP.    Please do your part to reduce the spread of COVID-19.

## 2020-03-09 ENCOUNTER — Ambulatory Visit (HOSPITAL_COMMUNITY): Payer: Medicare Other

## 2020-03-16 ENCOUNTER — Other Ambulatory Visit: Payer: Self-pay

## 2020-03-16 ENCOUNTER — Ambulatory Visit (HOSPITAL_COMMUNITY)
Admission: RE | Admit: 2020-03-16 | Discharge: 2020-03-16 | Disposition: A | Discharge status: Home / Self Care | Payer: Medicare Other | Source: Ambulatory Visit | Attending: Physician Assistant | Admitting: Physician Assistant

## 2020-03-16 DIAGNOSIS — E041 Nontoxic single thyroid nodule: Secondary | ICD-10-CM | POA: Diagnosis not present

## 2020-03-16 DIAGNOSIS — R918 Other nonspecific abnormal finding of lung field: Secondary | ICD-10-CM | POA: Insufficient documentation

## 2020-03-17 ENCOUNTER — Other Ambulatory Visit: Payer: Self-pay | Admitting: *Deleted

## 2020-03-17 DIAGNOSIS — R918 Other nonspecific abnormal finding of lung field: Secondary | ICD-10-CM

## 2020-03-17 NOTE — Progress Notes (Signed)
Patient called questioning when she would have her Brain MRI.  Per Cassie's last office visit note states plan for MRI.   Ordered MRI.

## 2020-03-18 ENCOUNTER — Other Ambulatory Visit (HOSPITAL_COMMUNITY)
Admission: RE | Admit: 2020-03-18 | Discharge: 2020-03-18 | Disposition: A | Discharge status: Home / Self Care | Payer: Medicare Other | Source: Ambulatory Visit | Attending: Pulmonary Disease | Admitting: Pulmonary Disease

## 2020-03-18 DIAGNOSIS — Z20822 Contact with and (suspected) exposure to covid-19: Secondary | ICD-10-CM | POA: Diagnosis not present

## 2020-03-18 DIAGNOSIS — Z01812 Encounter for preprocedural laboratory examination: Secondary | ICD-10-CM | POA: Diagnosis not present

## 2020-03-18 LAB — SARS CORONAVIRUS 2 (TAT 6-24 HRS): SARS Coronavirus 2: NEGATIVE

## 2020-03-20 ENCOUNTER — Telehealth: Payer: Self-pay | Admitting: Pulmonary Disease

## 2020-03-20 ENCOUNTER — Encounter: Payer: Self-pay | Admitting: *Deleted

## 2020-03-20 ENCOUNTER — Other Ambulatory Visit: Payer: Self-pay | Admitting: Physician Assistant

## 2020-03-20 ENCOUNTER — Encounter (HOSPITAL_COMMUNITY): Payer: Self-pay | Admitting: Pulmonary Disease

## 2020-03-20 DIAGNOSIS — E041 Nontoxic single thyroid nodule: Secondary | ICD-10-CM

## 2020-03-20 NOTE — Telephone Encounter (Signed)
Patient's allergies updated to reflect allergy to lidocaine as well as other medications that end with caine.

## 2020-03-20 NOTE — Telephone Encounter (Signed)
Pre-admission testing called me back and states patient has no one to stay with patient for 24 hours and general anesthesia states you have to have someone with you for 24 hours.   I called patient let her know we would have to cancel procedure if she didn't have anyone to stay with her per Dr. Valeta Harms. Patient voiced she didn't want to cancel procedure and she would find someone to stay with her.   Nothing further needed at this time.

## 2020-03-20 NOTE — Progress Notes (Addendum)
Jessica Beck denies chest pain or shortness of breath. Patient was tested negative for Covid on 03/18/2020 and has been in quarantine since that time.  Jessica Beck reported that she does not anyone that can stay with her "anytime after she gets home from surgery",  Patient asking for rational, I explained to her that anesthesia promotes sedation and she will someone with her for safety after sedation.  Jessica Beck had reported earlier in interview that she takes along time to wake up after general anesthesia and longer that normal to wake up from light  sedation- "twilight".    I told patient that I would need to speak with anesthesia.  I also sent some questions to Earnstine Regal, RN, Surveyor, quantity of Endoscopy.        I spoke with Dr. Suzette Battiest who said patient will have to have a competent adult for 24 hours after.  I called and spoke to Ander Purpura, a nurse ar [facility] and informed her of the information.  Lauren is going to call patient to see if Jessica Beck has found someone to stay with her 24 hours after procedure, if not case will be cancelled.

## 2020-03-21 ENCOUNTER — Ambulatory Visit (HOSPITAL_COMMUNITY): Payer: Medicare Other | Admitting: Anesthesiology

## 2020-03-21 ENCOUNTER — Other Ambulatory Visit: Payer: Self-pay

## 2020-03-21 ENCOUNTER — Encounter (HOSPITAL_COMMUNITY): Payer: Self-pay | Admitting: Pulmonary Disease

## 2020-03-21 ENCOUNTER — Ambulatory Visit (HOSPITAL_COMMUNITY): Payer: Medicare Other

## 2020-03-21 ENCOUNTER — Ambulatory Visit (HOSPITAL_COMMUNITY)
Admission: RE | Admit: 2020-03-21 | Discharge: 2020-03-21 | Disposition: A | Discharge status: Home / Self Care | Payer: Medicare Other | Attending: Pulmonary Disease | Admitting: Pulmonary Disease

## 2020-03-21 ENCOUNTER — Encounter (HOSPITAL_COMMUNITY)
Admission: RE | Disposition: A | Discharge status: Home / Self Care | Payer: Self-pay | Source: Home / Self Care | Attending: Pulmonary Disease

## 2020-03-21 DIAGNOSIS — J45909 Unspecified asthma, uncomplicated: Secondary | ICD-10-CM | POA: Diagnosis not present

## 2020-03-21 DIAGNOSIS — Z79899 Other long term (current) drug therapy: Secondary | ICD-10-CM | POA: Diagnosis not present

## 2020-03-21 DIAGNOSIS — E782 Mixed hyperlipidemia: Secondary | ICD-10-CM | POA: Diagnosis not present

## 2020-03-21 DIAGNOSIS — R918 Other nonspecific abnormal finding of lung field: Secondary | ICD-10-CM | POA: Diagnosis not present

## 2020-03-21 DIAGNOSIS — K219 Gastro-esophageal reflux disease without esophagitis: Secondary | ICD-10-CM | POA: Insufficient documentation

## 2020-03-21 DIAGNOSIS — I10 Essential (primary) hypertension: Secondary | ICD-10-CM | POA: Diagnosis not present

## 2020-03-21 DIAGNOSIS — R846 Abnormal cytological findings in specimens from respiratory organs and thorax: Secondary | ICD-10-CM | POA: Diagnosis not present

## 2020-03-21 DIAGNOSIS — R911 Solitary pulmonary nodule: Secondary | ICD-10-CM

## 2020-03-21 DIAGNOSIS — J189 Pneumonia, unspecified organism: Secondary | ICD-10-CM | POA: Diagnosis not present

## 2020-03-21 DIAGNOSIS — Z7982 Long term (current) use of aspirin: Secondary | ICD-10-CM | POA: Insufficient documentation

## 2020-03-21 DIAGNOSIS — Z9889 Other specified postprocedural states: Secondary | ICD-10-CM

## 2020-03-21 DIAGNOSIS — R05 Cough: Secondary | ICD-10-CM | POA: Diagnosis not present

## 2020-03-21 DIAGNOSIS — Z87891 Personal history of nicotine dependence: Secondary | ICD-10-CM | POA: Diagnosis not present

## 2020-03-21 HISTORY — PX: BRONCHIAL NEEDLE ASPIRATION BIOPSY: SHX5106

## 2020-03-21 HISTORY — PX: BRONCHIAL BIOPSY: SHX5109

## 2020-03-21 HISTORY — DX: Other complications of anesthesia, initial encounter: T88.59XA

## 2020-03-21 HISTORY — PX: BRONCHIAL WASHINGS: SHX5105

## 2020-03-21 HISTORY — PX: VIDEO BRONCHOSCOPY WITH ENDOBRONCHIAL NAVIGATION: SHX6175

## 2020-03-21 HISTORY — DX: Gastro-esophageal reflux disease without esophagitis: K21.9

## 2020-03-21 HISTORY — PX: BRONCHIAL BRUSHINGS: SHX5108

## 2020-03-21 HISTORY — DX: Nontoxic multinodular goiter: E04.2

## 2020-03-21 HISTORY — DX: Calculus of gallbladder without cholecystitis without obstruction: K80.20

## 2020-03-21 HISTORY — DX: Anxiety disorder, unspecified: F41.9

## 2020-03-21 HISTORY — DX: Unspecified osteoarthritis, unspecified site: M19.90

## 2020-03-21 LAB — BASIC METABOLIC PANEL
Anion gap: 11 (ref 5–15)
BUN: 15 mg/dL (ref 8–23)
CO2: 25 mmol/L (ref 22–32)
Calcium: 9.7 mg/dL (ref 8.9–10.3)
Chloride: 104 mmol/L (ref 98–111)
Creatinine, Ser: 0.94 mg/dL (ref 0.44–1.00)
GFR calc Af Amer: >60 mL/min (ref >60)
GFR calc non Af Amer: 58 mL/min — ABNORMAL LOW (ref >60)
Glucose, Bld: 107 mg/dL — ABNORMAL HIGH (ref 70–99)
Potassium: 3.5 mmol/L (ref 3.5–5.1)
Sodium: 140 mmol/L (ref 135–145)

## 2020-03-21 LAB — CBC
HCT: 42.9 % (ref 36.0–46.0)
Hemoglobin: 13.7 g/dL (ref 12.0–15.0)
MCH: 29.5 pg (ref 26.0–34.0)
MCHC: 31.9 g/dL (ref 30.0–36.0)
MCV: 92.3 fL (ref 80.0–100.0)
Platelets: 273 10*3/uL (ref 150–400)
RBC: 4.65 MIL/uL (ref 3.87–5.11)
RDW: 13.2 % (ref 11.5–15.5)
WBC: 5.5 10*3/uL (ref 4.0–10.5)
nRBC: 0 % (ref 0.0–0.2)

## 2020-03-21 SURGERY — VIDEO BRONCHOSCOPY WITH ENDOBRONCHIAL NAVIGATION
Anesthesia: General | Laterality: Right

## 2020-03-21 MED ORDER — CHLORHEXIDINE GLUCONATE 0.12 % MT SOLN
OROMUCOSAL | Status: AC
  Administered 2020-03-21: 15 mL
  Filled 2020-03-21: qty 15

## 2020-03-21 MED ORDER — PHENYLEPHRINE 40 MCG/ML (10ML) SYRINGE FOR IV PUSH (FOR BLOOD PRESSURE SUPPORT)
PREFILLED_SYRINGE | INTRAVENOUS | Status: DC | PRN
  Administered 2020-03-21: 200 ug via INTRAVENOUS
  Administered 2020-03-21: 80 ug via INTRAVENOUS

## 2020-03-21 MED ORDER — PHENYLEPHRINE HCL-NACL 10-0.9 MG/250ML-% IV SOLN
INTRAVENOUS | Status: DC | PRN
Start: 2020-03-21 — End: 2020-03-21
  Administered 2020-03-21: 25 ug/min via INTRAVENOUS

## 2020-03-21 MED ORDER — FENTANYL CITRATE (PF) 100 MCG/2ML IJ SOLN: 25.0000 ug | INTRAMUSCULAR | Status: DC | PRN

## 2020-03-21 MED ORDER — LACTATED RINGERS IV SOLN: INTRAVENOUS | Status: DC

## 2020-03-21 MED ORDER — FENTANYL CITRATE (PF) 250 MCG/5ML IJ SOLN
INTRAMUSCULAR | Status: DC | PRN
  Administered 2020-03-21 (×2): 50 ug via INTRAVENOUS

## 2020-03-21 MED ORDER — SUGAMMADEX SODIUM 200 MG/2ML IV SOLN
INTRAVENOUS | Status: DC | PRN
  Administered 2020-03-21: 200 mg via INTRAVENOUS

## 2020-03-21 MED ORDER — DEXAMETHASONE SODIUM PHOSPHATE 10 MG/ML IJ SOLN
INTRAMUSCULAR | Status: DC | PRN
  Administered 2020-03-21: 4 mg via INTRAVENOUS

## 2020-03-21 MED ORDER — ONDANSETRON HCL 4 MG/2ML IJ SOLN
INTRAMUSCULAR | Status: DC | PRN
  Administered 2020-03-21: 4 mg via INTRAVENOUS

## 2020-03-21 MED ORDER — ONDANSETRON HCL 4 MG/2ML IJ SOLN: 4.0000 mg | Freq: Once | INTRAMUSCULAR | Status: DC | PRN

## 2020-03-21 MED ORDER — ROCURONIUM BROMIDE 10 MG/ML (PF) SYRINGE
PREFILLED_SYRINGE | INTRAVENOUS | Status: DC | PRN
  Administered 2020-03-21: 10 mg via INTRAVENOUS
  Administered 2020-03-21: 40 mg via INTRAVENOUS

## 2020-03-21 MED ORDER — PROPOFOL 10 MG/ML IV BOLUS
INTRAVENOUS | Status: DC | PRN
  Administered 2020-03-21: 100 mg via INTRAVENOUS
  Administered 2020-03-21: 50 mg via INTRAVENOUS

## 2020-03-21 MED ORDER — EPHEDRINE SULFATE-NACL 50-0.9 MG/10ML-% IV SOSY
PREFILLED_SYRINGE | INTRAVENOUS | Status: DC | PRN
  Administered 2020-03-21 (×2): 5 mg via INTRAVENOUS

## 2020-03-21 SURGICAL SUPPLY — 44 items
ADAPTER BRONCH F/PENTAX (ADAPTER) ×3 IMPLANT
ADAPTER VALVE BIOPSY EBUS (MISCELLANEOUS) IMPLANT
ADPTR VALVE BIOPSY EBUS (MISCELLANEOUS)
BRUSH CYTOL CELLEBRITY 1.5X140 (MISCELLANEOUS) ×3 IMPLANT
BRUSH SUPERTRAX BIOPSY (INSTRUMENTS) IMPLANT
BRUSH SUPERTRAX NDL-TIP CYTO (INSTRUMENTS) ×3 IMPLANT
CANISTER SUCT 3000ML PPV (MISCELLANEOUS) ×3 IMPLANT
CHANNEL WORK EXTEND EDGE 180 (KITS) IMPLANT
CHANNEL WORK EXTEND EDGE 45 (KITS) IMPLANT
CHANNEL WORK EXTEND EDGE 90 (KITS) IMPLANT
CONT SPEC 4OZ CLIKSEAL STRL BL (MISCELLANEOUS) ×3 IMPLANT
COVER BACK TABLE 60X90IN (DRAPES) ×3 IMPLANT
FILTER STRAW FLUID ASPIR (MISCELLANEOUS) IMPLANT
FORCEPS BIOP SUPERTRX PREMAR (INSTRUMENTS) ×3 IMPLANT
GAUZE SPONGE 4X4 12PLY STRL (GAUZE/BANDAGES/DRESSINGS) ×3 IMPLANT
GLOVE SURG SS PI 7.5 STRL IVOR (GLOVE) ×6 IMPLANT
GOWN STRL REUS W/ TWL LRG LVL3 (GOWN DISPOSABLE) ×4 IMPLANT
GOWN STRL REUS W/TWL LRG LVL3 (GOWN DISPOSABLE) ×2
KIT CLEAN ENDO COMPLIANCE (KITS) ×3 IMPLANT
KIT LOCATABLE GUIDE (CANNULA) IMPLANT
KIT MARKER FIDUCIAL DELIVERY (KITS) IMPLANT
KIT PROCEDURE EDGE 180 (KITS) IMPLANT
KIT PROCEDURE EDGE 45 (KITS) IMPLANT
KIT PROCEDURE EDGE 90 (KITS) IMPLANT
KIT TURNOVER KIT B (KITS) ×3 IMPLANT
MARKER SKIN DUAL TIP RULER LAB (MISCELLANEOUS) ×3 IMPLANT
NEEDLE SUPERTRX PREMARK BIOPSY (NEEDLE) ×3 IMPLANT
NS IRRIG 1000ML POUR BTL (IV SOLUTION) ×3 IMPLANT
OIL SILICONE PENTAX (PARTS (SERVICE/REPAIRS)) ×3 IMPLANT
PAD ARMBOARD 7.5X6 YLW CONV (MISCELLANEOUS) ×6 IMPLANT
PATCHES PATIENT (LABEL) ×9 IMPLANT
SOL ANTI FOG 6CC (MISCELLANEOUS) ×2 IMPLANT
SOLUTION ANTI FOG 6CC (MISCELLANEOUS) ×1
SYR 20CC LL (SYRINGE) ×3 IMPLANT
SYR 20ML ECCENTRIC (SYRINGE) ×3 IMPLANT
SYR 50ML SLIP (SYRINGE) ×3 IMPLANT
TOWEL OR 17X24 6PK STRL BLUE (TOWEL DISPOSABLE) ×3 IMPLANT
TRAP SPECIMEN MUCOUS 40CC (MISCELLANEOUS) IMPLANT
TUBE CONNECTING 20X1/4 (TUBING) ×3 IMPLANT
UNDERPAD 30X30 (UNDERPADS AND DIAPERS) ×3 IMPLANT
VALVE BIOPSY  SINGLE USE (MISCELLANEOUS) ×1
VALVE BIOPSY SINGLE USE (MISCELLANEOUS) ×2 IMPLANT
VALVE SUCTION BRONCHIO DISP (MISCELLANEOUS) ×3 IMPLANT
WATER STERILE IRR 1000ML POUR (IV SOLUTION) ×3 IMPLANT

## 2020-03-21 NOTE — Interval H&P Note (Signed)
History and Physical Interval Note:  03/21/2020 8:54 AM  Jessica Beck  has presented today for surgery, with the diagnosis of RIGHT UPPER LOBE LUNG NODULES.  The various methods of treatment have been discussed with the patient and family. After consideration of risks, benefits and other options for treatment, the patient has consented to  Procedure(s): Geraldine (Right) as a surgical intervention.  The patient's history has been reviewed, patient examined, no change in status, stable for surgery.  I have reviewed the patient's chart and labs.  Questions were answered to the patient's satisfaction.    Patient seen in pre-op. All questions answered. Discussed R/B/A and the patient is agreeable to proceed. Discussed risks of bleeding ptx and death.   [place]

## 2020-03-21 NOTE — Anesthesia Preprocedure Evaluation (Addendum)
Anesthesia Evaluation  Patient identified by MRN, date of birth, ID band Patient awake    Reviewed: Allergy & Precautions, NPO status , Patient's Chart, lab work & pertinent test results  History of Anesthesia Complications (+) PROLONGED EMERGENCE and history of anesthetic complications  Airway Mallampati: II  TM Distance: >3 FB Neck ROM: Full    Dental  (+) Dental Advisory Given, Chipped   Pulmonary asthma , former smoker,    Pulmonary exam normal        Cardiovascular hypertension, Pt. on medications Normal cardiovascular exam     Neuro/Psych  Headaches, PSYCHIATRIC DISORDERS Anxiety  Denies CVA hx     GI/Hepatic Neg liver ROS, GERD  Medicated and Controlled, IBS    Endo/Other  negative endocrine ROS  Renal/GU negative Renal ROS     Musculoskeletal  (+) Arthritis ,   Abdominal   Peds  Hematology negative hematology ROS (+)   Anesthesia Other Findings Covid test negative   Reproductive/Obstetrics                            Anesthesia Physical Anesthesia Plan  ASA: III  Anesthesia Plan: General   Post-op Pain Management:    Induction: Intravenous  PONV Risk Score and Plan: 3 and Treatment may vary due to age or medical condition and Ondansetron  Airway Management Planned: Oral ETT  Additional Equipment: None  Intra-op Plan:   Post-operative Plan: Extubation in OR  Informed Consent: I have reviewed the patients History and Physical, chart, labs and discussed the procedure including the risks, benefits and alternatives for the proposed anesthesia with the patient or authorized representative who has indicated his/her understanding and acceptance.     Dental advisory given  Plan Discussed with: CRNA and Anesthesiologist  Anesthesia Plan Comments:        Anesthesia Quick Evaluation

## 2020-03-21 NOTE — Op Note (Addendum)
Video Bronchoscopy with Electromagnetic Navigation Procedure Note  Date of Operation: 03/21/2020  Pre-op Diagnosis: Bilateral lung nodules  Post-op Diagnosis: Bilateral lung nodules  Surgeon: Garner Nash, DO   Assistants: none   Anesthesia: General endotracheal anesthesia  Operation: Flexible video fiberoptic bronchoscopy with electromagnetic navigation and biopsies.  Estimated Blood Loss: Minimal  Complications: None   Indications and History: Jessica Beck is a 79 y.o. female with PET avid bilateral lung nodules.  The risks, benefits, complications, treatment options and expected outcomes were discussed with the patient.  The possibilities of pneumothorax, pneumonia, reaction to medication, pulmonary aspiration, perforation of a viscus, bleeding, failure to diagnose a condition and creating a complication requiring transfusion or operation were discussed with the patient who freely signed the consent.    Description of Procedure: The patient was seen in the Preoperative Area, was examined and was deemed appropriate to proceed.  The patient was taken to [facility] endoscopy room 2, identified as Jessica Beck and the procedure verified as Flexible Video Fiberoptic Bronchoscopy.  A Time Out was held and the above information confirmed.   Prior to the date of the procedure a high-resolution CT scan of the chest was performed. Utilizing [place] a virtual tracheobronchial tree was generated to allow the creation of distinct navigation pathways to the patient's parenchymal abnormalities. After being taken to the operating room general anesthesia was initiated and the patient  was orally intubated. The video fiberoptic bronchoscope was introduced via the endotracheal tube and a general inspection was performed which showed normal right and left lung anatomy with no evidence of endobronchial lesion. We used the computer system to navigate to 2 separate targets 1 within  the left upper lobe and the second within the right upper lobe. A full fluoroscopic sweep was obtained to navigation for both locations. Local registration was obtained with an inspiratory breath-hold 25 APL, fluoroscopic sweep from RAO 25 degrees to LAO 25 degrees. Both locations under local registration and fluoroscopic reconstructed images appeared to be smaller than the initial CT scan imaging. Both lesions were still identified however it does appear they have changed since the CT image in July 2021. The extendable working channel and locator guide were introduced into the bronchoscope. The distinct navigation pathways prepared prior to this procedure were then utilized to navigate to within 1.5 cm of patient's lesion(s) identified on CT scan. The extendable working channel was secured into place and the locator guide was withdrawn. Under fluoroscopic guidance transbronchial needle brushings, transbronchial Wang needle biopsies, and transbronchial forceps biopsies were performed to be sent for cytology and pathology. We also sent tissue biopsies for culture. From both the right upper lobe and left upper lobe lesions. A bronchioalveolar lavage was performed in the right upper lobe and left upper lobe and sent for cytology and microbiology (bacterial, fungal, AFB smears and cultures). In between switching from the left side to the right side continuous fluoroscopy was used to visualize normal respiration on the left and no evidence of pneumothorax before proceeding to the right side. At the end of the procedure a general airway inspection was performed and there was no evidence of active bleeding.  Standard bronchoscope was used for therapeutic aspiration and suctioning of bilateral mainstem's clearance of secretions and any remaining blood clots from the airways. The bronchoscope was removed.  The patient tolerated the procedure well. There was no significant blood loss and there were no obvious complications. A  post-procedural chest x-ray is pending.  Samples: 1. Transbronchial  needle brushings from left upper lobe, right upper lobe 2. Transbronchial Wang needle biopsies from left upper lobe and right upper lobe 3. Transbronchial forceps biopsies from left upper lobe and right upper lobe 4. Bronchoalveolar lavage from lobe and right upper lobe  Plans:  The patient will be discharged from the PACU to home when recovered from anesthesia and after chest x-ray is reviewed. We will review the cytology, pathology and microbiology results with the patient when they become available. Outpatient followup will be with Octavio Graves Welma Mccombs, DO.   Recommendations: She will need a noncontrasted CT of the chest to be completed in approximately 4 to 6 weeks for follow-up of the bilateral lung nodules.  Garner Nash, DO [facility] 03/21/2020 10:33 AM

## 2020-03-21 NOTE — Discharge Instructions (Signed)
Flexible Bronchoscopy, Care After This sheet gives you information about how to care for yourself after your test. Your doctor may also give you more specific instructions. If you have problems or questions, contact your doctor. Follow these instructions at home: Eating and drinking  The day after the test, go back to your normal diet. Driving  Do not drive for 24 hours if you were given a medicine to help you relax (sedative).  Do not drive or use heavy machinery while taking prescription pain medicine. General instructions   Take over-the-counter and prescription medicines only as told by your doctor.  Return to your normal activities as told. Ask what activities are safe for you.  Do not use any products that have nicotine or tobacco in them. This includes cigarettes and e-cigarettes. If you need help quitting, ask your doctor.  Keep all follow-up visits as told by your doctor. This is important. It is very important if you had a tissue sample (biopsy) taken. Get help right away if:  You have shortness of breath that gets worse.  You get light-headed.  You feel like you are going to pass out (faint).  You have chest pain.  You cough up: ? More than a little blood. ? More blood than before. Summary  Do not eat or drink anything (not even water) for 2 hours after your test, or until your numbing medicine wears off.  Do not use cigarettes. Do not use e-cigarettes.  Get help right away if you have chest pain. This information is not intended to replace advice given to you by your health care provider. Make sure you discuss any questions you have with your health care provider. Document Revised: 07/04/2017 Document Reviewed: 08/09/2016 Elsevier Patient Education  2020 [employer].

## 2020-03-21 NOTE — Transfer of Care (Signed)
Immediate Anesthesia Transfer of Care Note  Patient: Jessica Beck  Procedure(s) Performed: VIDEO BRONCHOSCOPY WITH ENDOBRONCHIAL NAVIGATION (Right ) BRONCHIAL BRUSHINGS BRONCHIAL NEEDLE ASPIRATION BIOPSIES BRONCHIAL WASHINGS BRONCHIAL BIOPSIES  Patient Location: PACU  Anesthesia Type:General  Level of Consciousness: awake, alert  and oriented  Airway & Oxygen Therapy: Patient Spontanous Breathing and Patient connected to nasal cannula oxygen  Post-op Assessment: Report given to RN and Post -op Vital signs reviewed and stable  Post vital signs: Reviewed and stable  Last Vitals:  Vitals Value Taken Time  BP 136/68 03/21/20 1032  Temp 36.7 C 03/21/20 1032  Pulse 80 03/21/20 1036  Resp 15 03/21/20 1036  SpO2 93 % 03/21/20 1036  Vitals shown include unvalidated device data.  Last Pain:  Vitals:   03/21/20 1032  TempSrc:   PainSc: (P) Asleep         Complications: No complications documented.

## 2020-03-21 NOTE — Anesthesia Procedure Notes (Signed)
Procedure Name: Intubation Date/Time: 03/21/2020 9:11 AM Performed by: Wilburn Cornelia, CRNA Pre-anesthesia Checklist: Patient identified, Emergency Drugs available, Suction available, Patient being monitored and Timeout performed Patient Re-evaluated:Patient Re-evaluated prior to induction Oxygen Delivery Method: Circle system utilized Preoxygenation: Pre-oxygenation with 100% oxygen Induction Type: IV induction Ventilation: Mask ventilation without difficulty Laryngoscope Size: Mac and 3 Grade View: Grade I Tube type: Oral Tube size: 8.5 mm Number of attempts: 1 Airway Equipment and Method: Stylet Placement Confirmation: ETT inserted through vocal cords under direct vision,  positive ETCO2,  CO2 detector and breath sounds checked- equal and bilateral Secured at: 21 cm Tube secured with: Tape Dental Injury: Teeth and Oropharynx as per pre-operative assessment

## 2020-03-21 NOTE — Anesthesia Postprocedure Evaluation (Signed)
Anesthesia Post Note  Patient: Jessica Beck  Procedure(s) Performed: VIDEO BRONCHOSCOPY WITH ENDOBRONCHIAL NAVIGATION (Right ) BRONCHIAL BRUSHINGS BRONCHIAL NEEDLE ASPIRATION BIOPSIES BRONCHIAL WASHINGS BRONCHIAL BIOPSIES     Patient location during evaluation: PACU Anesthesia Type: General Level of consciousness: awake and alert Pain management: pain level controlled Vital Signs Assessment: post-procedure vital signs reviewed and stable Respiratory status: spontaneous breathing, nonlabored ventilation and respiratory function stable Cardiovascular status: blood pressure returned to baseline and stable Postop Assessment: no apparent nausea or vomiting Anesthetic complications: no   No complications documented.  Last Vitals:  Vitals:   03/21/20 1118 03/21/20 1132  BP: 107/67 (!) 120/54  Pulse: 72 77  Resp: 12 13  Temp:  36.7 C  SpO2: 94% 95%    Last Pain:  Vitals:   03/21/20 1132  TempSrc:   PainSc: 0-No pain                 Audry Pili

## 2020-03-22 LAB — ACID FAST SMEAR (AFB, MYCOBACTERIA)
Acid Fast Smear: NEGATIVE
Acid Fast Smear: NEGATIVE
Acid Fast Smear: NEGATIVE
Acid Fast Smear: NEGATIVE

## 2020-03-23 ENCOUNTER — Encounter (HOSPITAL_COMMUNITY): Payer: Self-pay | Admitting: Pulmonary Disease

## 2020-03-23 ENCOUNTER — Telehealth: Payer: Self-pay

## 2020-03-23 LAB — SURGICAL PATHOLOGY

## 2020-03-23 LAB — CYTOLOGY - NON PAP

## 2020-03-23 NOTE — Telephone Encounter (Signed)
Received TC from pt stating that she needed to talk with Cassie to get a better understanding of her biopsy results. she stated she saw them on mychart but needs further understanding on things dealing with her lungs and would like for Cassie to give her a call. Cassie PA made aware

## 2020-03-23 NOTE — Telephone Encounter (Signed)
Returned TC to patient after speaking with Cassie PA I let patient know that her pathology is not back from our stand point but Dr. Valeta Harms did a lot of further testing for infection so his office is the one to be responsible for those results. Encouraged patient to reach out to him. Also Iet pt know that Cassie's schedule is full today so if she was able to get back to her it wouldn't be until the end of the day. Pt verbalized understanding. Lastly, let her know If her pathology is consistent with cancer, then we will arrange for a follow up visit to discuss the results in person next week. Patient verbalized understanding and stated that she would reach out to Dr icard for further understanding and appreciated the call back. No further problems or concerns at this time.

## 2020-03-24 ENCOUNTER — Telehealth: Payer: Self-pay | Admitting: Pulmonary Disease

## 2020-03-24 LAB — CULTURE, RESPIRATORY W GRAM STAIN
Culture: NO GROWTH
Culture: NO GROWTH

## 2020-03-24 NOTE — Telephone Encounter (Signed)
Spoke with patient. She stated that she had received several alerts from [place] in regards to her biopsy results. She wanted to know if Dr. Valeta Harms had reviewed them yet. She also wanted to know if she needed to cancel the brain MRI that she has scheduled.   I advised her that results are usually released to [place] before the provider has had the chance to review them. She verbalized understanding.   Dr. Valeta Harms, can you please advise? Thanks! (Please send message to triage as I can only PRN at pulmonary).

## 2020-03-24 NOTE — Telephone Encounter (Signed)
Thanks, I see her on 9/15 and will order

## 2020-03-24 NOTE — Telephone Encounter (Signed)
Team,   I called and spoke with the patient regarding her recent bronch results. All tissue is negative for malignancy. There was one read as atypical but favors inflammatory lesions.   Also, under fluoro the day of the case the nodules were both much much smaller than previously seen in PET scan. I suggest follow up CT chest in 6-8 weeks.   Thanks,   [employer]

## 2020-03-26 LAB — ANAEROBIC CULTURE

## 2020-03-27 ENCOUNTER — Other Ambulatory Visit: Payer: Self-pay

## 2020-03-27 ENCOUNTER — Ambulatory Visit (HOSPITAL_COMMUNITY)
Admission: RE | Admit: 2020-03-27 | Discharge: 2020-03-27 | Disposition: A | Discharge status: Home / Self Care | Payer: Medicare Other | Source: Ambulatory Visit | Attending: Physician Assistant | Admitting: Physician Assistant

## 2020-03-27 DIAGNOSIS — E042 Nontoxic multinodular goiter: Secondary | ICD-10-CM | POA: Insufficient documentation

## 2020-03-27 DIAGNOSIS — E041 Nontoxic single thyroid nodule: Secondary | ICD-10-CM

## 2020-03-27 LAB — CYTOLOGY - NON PAP

## 2020-03-27 LAB — ANAEROBIC CULTURE

## 2020-03-27 MED ORDER — LIDOCAINE HCL (PF) 1 % IJ SOLN
INTRAMUSCULAR | Status: AC
  Filled 2020-03-27: qty 30

## 2020-03-27 MED ORDER — TETRACAINE HCL 1 % IJ SOLN
10.0000 mg | Freq: Once | INTRAMUSCULAR | Status: DC
  Filled 2020-03-27: qty 2

## 2020-03-27 NOTE — Procedures (Signed)
Successful US guided FNA of left inferior thyroid nodule No complications. See PACS for full report.  Soyla Dryer, [place] [phone] 03/27/2020, 3:16 PM

## 2020-03-27 NOTE — Progress Notes (Signed)
Got it - thanks.

## 2020-03-28 LAB — CYTOLOGY - NON PAP

## 2020-03-30 ENCOUNTER — Ambulatory Visit (HOSPITAL_COMMUNITY): Admission: RE | Admit: 2020-03-30 | Payer: Medicare Other | Source: Ambulatory Visit

## 2020-03-30 ENCOUNTER — Encounter: Payer: Self-pay | Admitting: *Deleted

## 2020-04-06 NOTE — Progress Notes (Signed)
Ok, appreciate your input. I'll keep you in the loop after I see her

## 2020-04-14 LAB — FUNGUS CULTURE RESULT

## 2020-04-14 LAB — FUNGUS CULTURE WITH STAIN

## 2020-04-14 LAB — FUNGAL ORGANISM REFLEX

## 2020-04-17 ENCOUNTER — Ambulatory Visit (HOSPITAL_COMMUNITY): Payer: Medicare Other

## 2020-04-19 ENCOUNTER — Ambulatory Visit (INDEPENDENT_AMBULATORY_CARE_PROVIDER_SITE_OTHER): Payer: Medicare Other | Admitting: Primary Care

## 2020-04-19 ENCOUNTER — Other Ambulatory Visit: Payer: Self-pay

## 2020-04-19 ENCOUNTER — Encounter: Payer: Self-pay | Admitting: Primary Care

## 2020-04-19 DIAGNOSIS — R918 Other nonspecific abnormal finding of lung field: Secondary | ICD-10-CM

## 2020-04-19 NOTE — Progress Notes (Signed)
_0  ID: Gweneth Fritter, female    DOB: Mar 24, 1941, 79 y.o.   MRN: [id number]  Chief Complaint  Patient presents with  . Follow-up    Pt states she has been doing better since last visit and denies any complaints or concerns with her breathing.    Referring provider: Gladys Damme, MD  Synopsis: Referred in August 2021 for abnormal PET scan by Gladys Damme, MD  HPI: 79 year old female, former smoker quit in 1976.  Past medical history significant for lung mass, seasonal allergic rhinitis, hypertension, stroke cerebellum, GERD, IBS, hyperlipidemia.  Patient of Dr. Valeta Harms, seen for initial consult on 03/08/20. Anormal CT imaging and abnormal PET scan concerning for potential metachronous primary bronchogenic carcinomas. Case discussed by her primary care MD Dr. Ilda Mori as well as Oncology PA. Patient agreed with proceeding with tissue biopsy by navigational bronchoscopy to be completed at [facility].   Previous [facility] pulmonary encounters: 03/08/20 This is a 79 year old female past medical history of hypertension, asthma.  Had CT imaging which revealed bilateral lung nodules follow-up by PET scan imaging which revealed hypermetabolic uptake within both pulmonary nodules.  Decision was made for referred to pulmonary medicine for evaluation of biopsy.  Patient was seen in the oncology office on 02/21/2020.  Other history includes osteoarthritis, IBS, GERD and a prior bowel perforation secondary to an appendicitis.  Patient is a former smoker who quit in 1976 with an approximate 45-pack-year history.  She does have occasional alcohol use.  OV 03/08/2020: Patient here today for discussion regarding bronchoscopy and abnormal pet imaging.  We discussed images today in the office as well as the risk benefits and alternatives of proceeding with procedure.  Patient has no other significant respiratory complaints at this time.   04/19/2020 - Interim hx Former smoker,quit in 1978. Referred to Dr.  Valeta Harms for evaluation of bilateral pulmonary nodules. PET scan revealed hypermetabolic uptake within both pulmonary nodules. She underwent tissue biopsy by navigational bronchoscopy with Dr. Valeta Harms. Biopsy tissue came back negative for malignancy. There was one read as atypical but favors inflammatory lesions. Also under fluoroscopy the day of the case the nodules were much smaller than previously seen in PET scan. She was very upset today from the distress from previous testing. She understand bx results were negative for malignancy. We also reviewed BAL cultures that showed no growth and well as AFB culture which was negative. Plan repeat CT chest wo contrast in 4 weeks and follow-up with Dr. Valeta Harms afterwards in office to discuss. She does have a cough which has improved from previous. No other significant respiratory symptoms.   Cytology:  - 03/21/20 RUL lavage - no malignant cell identified, acute inflammation  - 03/21/20 LUL brushings- no malignant cells; LUL needle no malignant cells  - 03/21/20 RUL brushing - no malignant cells; RUL needle rare atypical cells  - 03/27/20 Left thyroid fine needle aspiration was consistent with benign follicular nodules.   Pathology: - Lung, left upper lobe bx- predominantly filbrin with scant respiratory type mucosa, no over malignancy identified.  - Lung, right upper lobe bx- scare, crushed respiratory type mucosa, no overt malignancy   Cultures:  - 03/21/20 BAL- no growth after 2 days - 03/21/20 AFB negative    Allergies  Allergen Reactions  . Other Swelling    Patient is allergic to anything in the CANE family ex. lidocaine, novacain  . Oxycodone Hcl Hives  . Flexeril [Cyclobenzaprine] Anxiety and Rash    Pt stated, "it made me feel irrational;  made me feel not like myself"  . Latex Rash  . Sulfa Antibiotics Other (See Comments)      rash    Immunization History  Administered Date(s) Administered  . Fluad Quad(high Dose 65+) 05/26/2019  . Influenza,  High Dose Seasonal PF 07/10/2015  . Influenza, Seasonal, Injecte, Preservative Fre 04/23/2016  . Influenza,inj,Quad PF,6+ Mos 05/22/2017, 05/13/2018  . Influenza-Unspecified 05/05/2014  . PFIZER SARS-COV-2 Vaccination 09/10/2019, 10/05/2019  . Pneumococcal Conjugate-13 08/18/2015  . Pneumococcal Polysaccharide-23 08/06/2007  . Zoster 08/06/2006    Past Medical History:  Diagnosis Date  . Anxiety    situ  . Arthritis    osteo   . Asthma    mild  . Complication of anesthesia    general - long time to wake up.  Takes a longer than  usual time to wake up after colonoscopy.  Kennyth Arnold bladder stones    present  . GERD (gastroesophageal reflux disease)   . Hypertension   . IBS (irritable bowel syndrome)    with diarrhea  . Migraine 03/07/2017   03/20/20- "mild"  . Multiple thyroid nodules     Tobacco History: Social History   Tobacco Use  Smoking Status Former Smoker  . Years: 20.00  . Quit date: 08/05/1974  . Years since quitting: 45.7  Smokeless Tobacco Never Used   Counseling given: Not Answered   Outpatient Medications Prior to Visit  Medication Sig Dispense Refill  . acetaminophen (TYLENOL) 500 MG tablet Take 1,000 mg by mouth every 6 (six) hours as needed for moderate pain.     Marland Kitchen aspirin EC 81 MG tablet Take 81 mg by mouth every evening.     . Cholecalciferol 25 MCG (1000 UT) tablet Take 1,000 Units by mouth daily.     . fluticasone (FLONASE) 50 MCG/ACT nasal spray Place 2 sprays into both nostrils daily. (Patient taking differently: Place 2 sprays into both nostrils daily as needed for allergies. ) 16 g 0  . loperamide (IMODIUM) 2 MG capsule Take 1 mg by mouth as needed for diarrhea or loose stools (IBS-D). Takes 1/2 of 2 mg    . magnesium oxide (MAG-OX) 400 MG tablet Take 400 mg by mouth every evening.     . meloxicam (MOBIC) 15 MG tablet Take 15 mg by mouth daily. Take 1/2 tab every other day    . omeprazole (PRILOSEC) 20 MG capsule TAKE (1) CAPSULE DAILY. (Patient  taking differently: Take 20 mg by mouth daily. ) 90 capsule 3  . Polyethyl Glycol-Propyl Glycol (SYSTANE) 0.4-0.3 % SOLN Place 1 drop into both eyes daily as needed (dry eyes).     . Probiotic Product (ALIGN PO) Take 1 capsule by mouth daily.    Marland Kitchen telmisartan-hydrochlorothiazide (MICARDIS HCT) 80-25 MG tablet TAKE 1 TABLET ONCE DAILY. (Patient taking differently: Take 1 tablet by mouth daily. ) 90 tablet 3  . traMADol (ULTRAM) 50 MG tablet Take 1 tablet (50 mg total) by mouth 3 (three) times daily as needed for moderate pain or severe pain. 120 tablet 3  . PROAIR HFA 108 (90 Base) MCG/ACT inhaler Take 2 puffs by mouth daily as needed for wheezing or shortness of breath.      No facility-administered medications prior to visit.   Review of Systems  Review of Systems  Constitutional: Negative.   Respiratory: Positive for cough. Negative for chest tightness, shortness of breath and wheezing.   Cardiovascular: Negative.    Physical Exam  BP 122/78 (BP Location: Right Arm, Cuff Size:  Normal)   Pulse 93   Temp (!) 97.1 F (36.2 C) (Other (Comment)) Comment (Src): wrist  Ht _0  (1.575 m)   Wt 148 lb 9.6 oz (67.4 kg)   SpO2 98%   BMI 27.18 kg/m  Physical Exam Constitutional:      Appearance: Normal appearance.  HENT:     Head: Normocephalic and atraumatic.     Right Ear: Tympanic membrane normal. There is no impacted cerumen.     Left Ear: Tympanic membrane normal. There is no impacted cerumen.     Mouth/Throat:     Mouth: Mucous membranes are moist.     Pharynx: Oropharynx is clear.  Cardiovascular:     Rate and Rhythm: Normal rate and regular rhythm.  Pulmonary:     Effort: Pulmonary effort is normal.     Breath sounds: Normal breath sounds.  Abdominal:     General: Abdomen is flat.     Palpations: Abdomen is soft.  Musculoskeletal:     Cervical back: Normal range of motion and neck supple.  Skin:    General: Skin is warm and dry.  Neurological:     General: No focal  deficit present.     Mental Status: She is alert and oriented to person, place, and time. Mental status is at baseline.  Psychiatric:        Mood and Affect: Mood normal.        Behavior: Behavior normal.        Thought Content: Thought content normal.        Judgment: Judgment normal.     Comments: Anxious/upset      Lab Results:  CBC    Component Value Date/Time   WBC 5.5 03/21/2020 0753   RBC 4.65 03/21/2020 0753   HGB 13.7 03/21/2020 0753   HGB 14.6 02/21/2020 1229   HGB 14.1 02/09/2020 1048   HCT 42.9 03/21/2020 0753   HCT 41.2 02/09/2020 1048   PLT 273 03/21/2020 0753   PLT 426 (H) 02/21/2020 1229   PLT 393 02/09/2020 1048   MCV 92.3 03/21/2020 0753   MCV 91 02/09/2020 1048   MCH 29.5 03/21/2020 0753   MCHC 31.9 03/21/2020 0753   RDW 13.2 03/21/2020 0753   RDW 11.7 02/09/2020 1048   LYMPHSABS 2.8 02/21/2020 1229   LYMPHSABS 2.8 02/09/2020 1048   MONOABS 0.5 02/21/2020 1229   EOSABS 0.1 02/21/2020 1229   EOSABS 0.2 02/09/2020 1048   BASOSABS 0.0 02/21/2020 1229   BASOSABS 0.1 02/09/2020 1048    BMET    Component Value Date/Time   NA 140 03/21/2020 0753   NA 136 02/09/2020 1048   K 3.5 03/21/2020 0753   CL 104 03/21/2020 0753   CO2 25 03/21/2020 0753   GLUCOSE 107 (H) 03/21/2020 0753   BUN 15 03/21/2020 0753   BUN 15 02/09/2020 1048   CREATININE 0.94 03/21/2020 0753   CREATININE 1.05 (H) 02/21/2020 1229   CALCIUM 9.7 03/21/2020 0753   GFRNONAA 58 (L) 03/21/2020 0753   GFRNONAA 51 (L) 02/21/2020 1229   GFRAA >60 03/21/2020 0753   GFRAA 59 (L) 02/21/2020 1229    BNP No results found for: BNP  ProBNP No results found for: PROBNP  Imaging: [place] FNA BX THYROID 1ST LESION AFIRMA  Result Date: 03/27/2020 INDICATION: Indeterminate thyroid nodule, left inferior lobe EXAM: ULTRASOUND GUIDED FINE NEEDLE ASPIRATION OF INDETERMINATE THYROID NODULE COMPARISON:  US Thyroid, 03/16/20 MEDICATIONS: Tetracaine, 2 mL COMPLICATIONS: None immediate. TECHNIQUE:  Informed written consent was  obtained from the patient after a discussion of the risks, benefits and alternatives to treatment. Questions regarding the procedure were encouraged and answered. A timeout was performed prior to the initiation of the procedure. Pre-procedural ultrasound scanning demonstrated unchanged size and appearance of the indeterminate nodule within the left inferior thyroid The procedure was planned. The neck was prepped in the usual sterile fashion, and a sterile drape was applied covering the operative field. A timeout was performed prior to the initiation of the procedure. Local anesthesia was provided with 1% tetracaine. Under direct ultrasound guidance, 5 FNA biopsies were performed of the left inferior thyroid with a 25 gauge needle. Multiple ultrasound images were saved for procedural documentation purposes. The samples were prepared and submitted to pathology. Two of the samples were prepared for Afirma testing. Limited post procedural scanning was negative for hematoma or additional complication. Dressings were placed. The patient tolerated the above procedures procedure well without immediate postprocedural complication. FINDINGS: Nodule reference number based on prior diagnostic ultrasound: 2 Maximum size: 5.0 cm Location: Left; Inferior ACR TI-RADS risk category: TR4 (4-6 points) Reason for biopsy: meets ACR TI-RADS criteria Ultrasound imaging confirms appropriate placement of the needles within the thyroid nodule. IMPRESSION: Technically successful ultrasound guided fine needle aspiration of left inferior thyroid nodule. Read by: Soyla Dryer, NP Electronically Signed   By: Lucrezia Europe M.D.   On: 03/27/2020 15:15   DG CHEST PORT 1 VIEW  Result Date: 03/21/2020 CLINICAL DATA:  Post bronchoscopy.  Bilateral lung masses. EXAM: PORTABLE CHEST 1 VIEW COMPARISON:  Chest x-ray 02/09/2020.  Chest CT 02/11/2020 FINDINGS: Left upper lobe nodule is less apparent on today's study. Right  upper lobe nodule is best seen on prior CT. There is now mild airspace disease in the right upper lobe which may be due to bronchial lavage or hemorrhage from bronchoscopy. Negative for pneumothorax.  No heart failure or effusion. IMPRESSION: No pneumothorax post bronchoscopy Upper lobe masses bilaterally best seen on CT. New area of airspace disease right upper lobe likely related to bronchoscopy. Electronically Signed   By: Franchot Gallo M.D.   On: 03/21/2020 11:09   DG C-ARM BRONCHOSCOPY  Result Date: 03/21/2020 C-ARM BRONCHOSCOPY: Fluoroscopy was utilized by the requesting physician.  No radiographic interpretation.     Assessment & Plan:   Lung mass - Former smoker, quit in 1976. Patient referred back in August 2021 d/t bilateral pulmonary nodules which were PET positive. She underwent tissue biopsy by navigational bronchoscopy on 03/21/20 with Dr. Valeta Harms. Biopsy tissue came back negative for malignancy. There was one read as atypical but favors inflammatory lesions. Cultures have also showed no growth and AFB was negative. She does have an occasional cough which has improved from previous. She has no other respiratory complaints. Plan repeat CT chest wo contrast in 4 weeks and follow-up in office afterwards with Dr. Valeta Harms.    Martyn Ehrich, NP 04/19/2020

## 2020-04-19 NOTE — Patient Instructions (Addendum)
Nice seeing you today Jessica Beck  Biopsy was negative from malignancy, one read atypical but favors inflammatory lesion. The nodules were actually much smaller under fluoroscopy then originally seen on PET scan. Dr. Valeta Harms recommend repeat CT chest in 4 weeks   Orders:  Repeat CT chest wo contast in 4 weeks   Follow-up: 4 weeks with Dr. Valeta Harms in person      Pulmonary Nodule A pulmonary nodule is a small, round growth of tissue in the lung. It is sometimes referred to as a "shadow" or "spot on the lung." Nodules range in size from less than 1/5 of an inch (4 mm) to a little bigger than an inch (30 mm). Pulmonary nodules can be either noncancerous (benign) or cancerous (malignant). Most are noncancerous. Smaller nodules in people who do not smoke and do not have any other risk factors for lung cancer are more likely to be noncancerous. Larger, irregular nodules in people who smoke or who have a strong family history of lung cancer are more likely to be cancerous. What are the causes? This condition may be caused by:  A bacterial, fungal, or viral infection, such as tuberculosis. The infection is usually an old and inactive one.  A noncancerous mass of tissue.  Inflammation from conditions such as rheumatoid arthritis.  Abnormal blood vessels in the lungs.  Cancerous tissue, such as lung cancer or a cancer in another part of the body that has spread to the lung. What are the signs or symptoms? This condition usually does not cause symptoms. If symptoms appear, they are usually related to the underlying cause. For example, if the condition is caused by an infection, you may have a cough or fever. How is this diagnosed? This condition is usually diagnosed with an X-ray or CT scan. To help determine whether a pulmonary nodule is benign or malignant, your health care provider will:  Take your medical history.  Perform a physical exam.  Order tests, including: ? Blood tests. ? A skin  test called a tuberculin test. This test is done to check if you have been exposed to the germ that causes tuberculosis. ? Chest X-rays. ? A CT scan. This test shows smaller pulmonary nodules more clearly and with more detail than an X-ray. ? A positron emission tomography (PET) scan. This test is done to check if the nodule is cancerous. During the test, a safe amount of a radioactive substance is injected into the bloodstream. Then a picture is taken. ? Biopsy. In this test, a tiny piece of the pulmonary nodule is removed and then examined under a microscope. How is this treated? Treatment for this condition depends on whether the pulmonary nodule is malignant or benign as well as your risk of getting cancer.  Noncancerous nodules usually do not need to be treated, but they may need to be monitored with CT scans. If a CT scan shows that the pulmonary nodule got bigger, more tests may be done.  Some nodules need to be removed. If this is the case, you may have a procedure called a thoractomy. During the procedure, your health care provider will make an incision in your chest and remove the part of the lung where the nodule is located. Follow these instructions at home:   Take over-the-counter and prescription medicines only as told by your health care provider.  Do not use any products that contain nicotine or tobacco, such as cigarettes and e-cigarettes. If you need help quitting, ask your health care  provider.  Keep all follow-up visits as told by your health care provider. This is important. Contact a health care provider if:  You have trouble breathing when you are active.  You feel sick or unusually tired.  You do not feel like eating.  You lose weight without trying.  You develop chills or night sweats. Get help right away if:  You cannot catch your breath.  You begin wheezing.  You cannot stop coughing.  You cough up blood.  You become dizzy or feel like you are going  to faint.  You have sudden chest pain.  You have a fever or persistent symptoms for more than 2-3 days.  You have a fever and your symptoms suddenly get worse. Summary  A pulmonary nodule is a small, round growth of tissue in the lung. Most pulmonary nodules are noncancerous.  This condition is usually diagnosed with an X-ray or CT scan.  Common causes of pulmonary nodules include infection, inflammation, and noncancerous growths.  Though less common, if a nodule is found to be cancerous, you will need specific diagnostic tests and treatment options as directed by your medical provider.  Treatment for this condition depends on whether the pulmonary nodule is benign or malignant as well as your risk of getting cancer. This information is not intended to replace advice given to you by your health care provider. Make sure you discuss any questions you have with your health care provider. Document Revised: 08/15/2017 Document Reviewed: 08/20/2016 Elsevier Patient Education  [place].

## 2020-04-19 NOTE — Assessment & Plan Note (Addendum)
-   Former smoker, quit in 1976. Patient referred back in August 2021 d/t bilateral pulmonary nodules which were PET positive. She underwent tissue biopsy by navigational bronchoscopy on 03/21/20 with Dr. Valeta Harms. Biopsy tissue came back negative for malignancy. There was one read as atypical but favors inflammatory lesions. Cultures have also showed no growth and AFB was negative. She does have an occasional cough which has improved from previous. She has no other respiratory complaints. Plan repeat CT chest wo contrast in 4 weeks and follow-up in office afterwards with Dr. Valeta Harms.

## 2020-04-20 ENCOUNTER — Telehealth: Payer: Self-pay | Admitting: Physician Assistant

## 2020-04-20 LAB — FUNGUS CULTURE RESULT

## 2020-04-20 LAB — FUNGUS CULTURE WITH STAIN

## 2020-04-20 LAB — FUNGAL ORGANISM REFLEX

## 2020-04-20 NOTE — Telephone Encounter (Signed)
I called the patient about her thyroid nodule results. The thyroid nodule is benign. The patient had several questions. I tried to the best of my ability to answer them.

## 2020-04-26 NOTE — Progress Notes (Signed)
Beth, just to keep you in the loop. I reached out to Dr. Tommy Medal from [facility] to look at one of the fungus cultures. He is going to discuss with one of his colleagues at [employer]. Seems most likely a contaminant. Follow up imaging will help [place] here for sure.  Thanks  Lance Muss, DO [facility] 04/26/2020 3:27 PM

## 2020-05-04 LAB — ACID FAST CULTURE WITH REFLEXED SENSITIVITIES (MYCOBACTERIA)
Acid Fast Culture: NEGATIVE
Acid Fast Culture: NEGATIVE
Acid Fast Culture: NEGATIVE
Acid Fast Culture: NEGATIVE

## 2020-05-15 ENCOUNTER — Ambulatory Visit: Payer: Medicare Other | Admitting: Family Medicine

## 2020-05-17 ENCOUNTER — Other Ambulatory Visit: Payer: Self-pay

## 2020-05-17 ENCOUNTER — Ambulatory Visit (HOSPITAL_COMMUNITY)
Admission: RE | Admit: 2020-05-17 | Discharge: 2020-05-17 | Disposition: A | Discharge status: Home / Self Care | Payer: Medicare Other | Source: Ambulatory Visit | Attending: Primary Care | Admitting: Primary Care

## 2020-05-17 DIAGNOSIS — R918 Other nonspecific abnormal finding of lung field: Secondary | ICD-10-CM

## 2020-05-17 DIAGNOSIS — R911 Solitary pulmonary nodule: Secondary | ICD-10-CM | POA: Diagnosis not present

## 2020-05-17 DIAGNOSIS — I2510 Atherosclerotic heart disease of native coronary artery without angina pectoris: Secondary | ICD-10-CM | POA: Diagnosis not present

## 2020-05-17 DIAGNOSIS — I700 Atherosclerosis of aorta: Secondary | ICD-10-CM | POA: Diagnosis not present

## 2020-05-18 ENCOUNTER — Other Ambulatory Visit: Payer: Self-pay

## 2020-05-18 ENCOUNTER — Encounter: Payer: Self-pay | Admitting: Pulmonary Disease

## 2020-05-18 ENCOUNTER — Ambulatory Visit (INDEPENDENT_AMBULATORY_CARE_PROVIDER_SITE_OTHER): Payer: Medicare Other | Admitting: Pulmonary Disease

## 2020-05-18 VITALS — BP 116/64 | HR 100 | Temp 97.2°F | Ht 62.0 in | Wt 148.4 lb

## 2020-05-18 DIAGNOSIS — R911 Solitary pulmonary nodule: Secondary | ICD-10-CM | POA: Diagnosis not present

## 2020-05-18 DIAGNOSIS — R918 Other nonspecific abnormal finding of lung field: Secondary | ICD-10-CM | POA: Diagnosis not present

## 2020-05-18 DIAGNOSIS — R942 Abnormal results of pulmonary function studies: Secondary | ICD-10-CM | POA: Diagnosis not present

## 2020-05-18 NOTE — Progress Notes (Signed)
Synopsis: Referred in August 2021 for abnormal PET scan by Gladys Damme, MD  Subjective:   PATIENT ID: Jessica Beck GENDER: female DOB: 01-16-1941, MRN: [id number]  Chief Complaint  Patient presents with  . Follow-up    denies problems, review CT chest results    This is a 79 year old female past medical history of hypertension, asthma.  Had CT imaging which revealed bilateral lung nodules follow-up by PET scan imaging which revealed hypermetabolic uptake within both pulmonary nodules.  Decision was made for referred to pulmonary medicine for evaluation of biopsy.  Patient was seen in the oncology office on 02/21/2020.  Other history includes osteoarthritis, IBS, GERD and a prior bowel perforation secondary to an appendicitis.  Patient is a former smoker who quit in 1976 with an approximate 45-pack-year history.  She does have occasional alcohol use.  OV 03/08/2020: Patient here today for discussion regarding bronchoscopy and abnormal pet imaging.  We discussed images today in the office as well as the risk benefits and alternatives of proceeding with procedure.  Patient has no other significant respiratory complaints at this time.  OV 05/18/2020: Here today for follow-up after recent bronchoscopy.  She had navigational bronchoscopy to 2 separate PET avid lesions which both appear have to dissipated after follow-up CT scan imaging.  She had CT scan done, yesterday not read by radiology but we reviewed images today in the office.  Patient still very anxious about everything that is been going on but very happy in the same time.   Past Medical History:  Diagnosis Date  . Anxiety    situ  . Arthritis    osteo   . Asthma    mild  . Complication of anesthesia    general - long time to wake up.  Takes a longer than  usual time to wake up after colonoscopy.  [place] bladder stones    present  . GERD (gastroesophageal reflux disease)   . Hypertension   . IBS (irritable bowel syndrome)     with diarrhea  . Migraine 03/07/2017   03/20/20- "mild"  . Multiple thyroid nodules      No family history on file.   Past Surgical History:  Procedure Laterality Date  . BRONCHIAL BIOPSY  03/21/2020   Procedure: BRONCHIAL BIOPSIES;  Surgeon: Garner Nash, DO;  Location: [facility];  Service: Pulmonary;;  . BRONCHIAL BRUSHINGS  03/21/2020   Procedure: BRONCHIAL BRUSHINGS;  Surgeon: Garner Nash, DO;  Location: [facility];  Service: Pulmonary;;  . BRONCHIAL NEEDLE ASPIRATION BIOPSY  03/21/2020   Procedure: BRONCHIAL NEEDLE ASPIRATION BIOPSIES;  Surgeon: Garner Nash, DO;  Location: [facility];  Service: Pulmonary;;  . BRONCHIAL WASHINGS  03/21/2020   Procedure: BRONCHIAL WASHINGS;  Surgeon: Garner Nash, DO;  Location: [facility];  Service: Pulmonary;;  . HIP RESECTION ARTHROPLASTY Right   . KNEE ARTHROPLASTY Right   . KNEE ARTHROPLASTY Left   . LAPAROSCOPIC APPENDECTOMY N/A 09/14/2016   Procedure: APPENDECTOMY LAPAROSCOPIC;  Surgeon: Armandina Gemma, MD;  Location: [facility];  Service: General;  Laterality: N/A;  . VIDEO BRONCHOSCOPY WITH ENDOBRONCHIAL NAVIGATION Right 03/21/2020   Procedure: VIDEO BRONCHOSCOPY WITH ENDOBRONCHIAL NAVIGATION;  Surgeon: Garner Nash, DO;  Location: [facility];  Service: Pulmonary;  Laterality: Right;    Social History   Socioeconomic History  . Marital status: Divorced    Spouse name: Not on file  . Number of children: Not on file  . Years of education: Not on file  . Highest  education level: Not on file  Occupational History  . Not on file  Tobacco Use  . Smoking status: Former Smoker    Years: 20.00    Quit date: 08/05/1974    Years since quitting: 45.8  . Smokeless tobacco: Never Used  Vaping Use  . Vaping Use: Never used  Substance and Sexual Activity  . Alcohol use: Yes    Alcohol/week: 5.0 standard drinks    Types: 5 Glasses of wine per week  . Drug use: Never  . Sexual activity: Not on file  Other Topics Concern    . Not on file  Social History Narrative  . Not on file   Social Determinants of Health   Financial Resource Strain:   . Difficulty of Paying Living Expenses: Not on file  Food Insecurity:   . Worried About Charity fundraiser in the Last Year: Not on file  . Ran Out of Food in the Last Year: Not on file  Transportation Needs:   . Lack of Transportation (Medical): Not on file  . Lack of Transportation (Non-Medical): Not on file  Physical Activity:   . Days of Exercise per Week: Not on file  . Minutes of Exercise per Session: Not on file  Stress:   . Feeling of Stress : Not on file  Social Connections:   . Frequency of Communication with Friends and Family: Not on file  . Frequency of Social Gatherings with Friends and Family: Not on file  . Attends Religious Services: Not on file  . Active Member of Clubs or Organizations: Not on file  . Attends Archivist Meetings: Not on file  . Marital Status: Not on file  Intimate Partner Violence:   . Fear of Current or Ex-Partner: Not on file  . Emotionally Abused: Not on file  . Physically Abused: Not on file  . Sexually Abused: Not on file     Allergies  Allergen Reactions  . Other Swelling    Patient is allergic to anything in the CANE family ex. lidocaine, novacain  . Oxycodone Hcl Hives  . Flexeril [Cyclobenzaprine] Anxiety and Rash    Pt stated, "it made me feel irrational; made me feel not like myself"  . Latex Rash  . Sulfa Antibiotics Other (See Comments)      rash     Outpatient Medications Prior to Visit  Medication Sig Dispense Refill  . acetaminophen (TYLENOL) 500 MG tablet Take 1,000 mg by mouth every 6 (six) hours as needed for moderate pain.     Marland Kitchen aspirin EC 81 MG tablet Take 81 mg by mouth every evening.     . Cholecalciferol 25 MCG (1000 UT) tablet Take 1,000 Units by mouth daily.     . fluticasone (FLONASE) 50 MCG/ACT nasal spray Place 2 sprays into both nostrils daily. (Patient taking  differently: Place 2 sprays into both nostrils daily as needed for allergies. ) 16 g 0  . loperamide (IMODIUM) 2 MG capsule Take 1 mg by mouth as needed for diarrhea or loose stools (IBS-D). Takes 1/2 of 2 mg    . magnesium oxide (MAG-OX) 400 MG tablet Take 400 mg by mouth every evening.     . meloxicam (MOBIC) 15 MG tablet Take 15 mg by mouth daily. Take 1/2 tab every other day    . omeprazole (PRILOSEC) 20 MG capsule TAKE (1) CAPSULE DAILY. (Patient taking differently: Take 20 mg by mouth daily. ) 90 capsule 3  . Polyethyl Glycol-Propyl Glycol (  SYSTANE) 0.4-0.3 % SOLN Place 1 drop into both eyes daily as needed (dry eyes).     . Probiotic Product (ALIGN PO) Take 1 capsule by mouth daily.    Marland Kitchen telmisartan-hydrochlorothiazide (MICARDIS HCT) 80-25 MG tablet TAKE 1 TABLET ONCE DAILY. (Patient taking differently: Take 1 tablet by mouth daily. ) 90 tablet 3  . traMADol (ULTRAM) 50 MG tablet Take 1 tablet (50 mg total) by mouth 3 (three) times daily as needed for moderate pain or severe pain. 120 tablet 3   No facility-administered medications prior to visit.    Review of Systems  Constitutional: Negative for chills, fever, malaise/fatigue and weight loss.  HENT: Negative for hearing loss, sore throat and tinnitus.   Eyes: Negative for blurred vision and double vision.  Respiratory: Negative for cough, hemoptysis, sputum production, shortness of breath, wheezing and stridor.   Cardiovascular: Negative for chest pain, palpitations, orthopnea, leg swelling and PND.  Gastrointestinal: Negative for abdominal pain, constipation, diarrhea, heartburn, nausea and vomiting.  Genitourinary: Negative for dysuria, hematuria and urgency.  Musculoskeletal: Negative for joint pain and myalgias.  Skin: Negative for itching and rash.  Neurological: Negative for dizziness, tingling, weakness and headaches.  Endo/Heme/Allergies: Negative for environmental allergies. Does not bruise/bleed easily.   Psychiatric/Behavioral: Negative for depression. The patient is not nervous/anxious and does not have insomnia.   All other systems reviewed and are negative.    Objective:  Physical Exam Vitals reviewed.  Constitutional:      General: She is not in acute distress.    Appearance: She is well-developed.  HENT:     Head: Normocephalic and atraumatic.  Eyes:     General: No scleral icterus.    Conjunctiva/sclera: Conjunctivae normal.     Pupils: Pupils are equal, round, and reactive to light.  Neck:     Vascular: No JVD.     Trachea: No tracheal deviation.  Cardiovascular:     Rate and Rhythm: Normal rate and regular rhythm.     Heart sounds: Normal heart sounds. No murmur heard.   Pulmonary:     Effort: Pulmonary effort is normal. No tachypnea, accessory muscle usage or respiratory distress.     Breath sounds: Normal breath sounds. No stridor. No wheezing, rhonchi or rales.  Abdominal:     General: Bowel sounds are normal. There is no distension.     Palpations: Abdomen is soft.     Tenderness: There is no abdominal tenderness.  Musculoskeletal:        General: No tenderness.     Cervical back: Neck supple.  Lymphadenopathy:     Cervical: No cervical adenopathy.  Skin:    General: Skin is warm and dry.     Capillary Refill: Capillary refill takes less than 2 seconds.     Findings: No rash.  Neurological:     Mental Status: She is alert and oriented to person, place, and time.  Psychiatric:        Behavior: Behavior normal.      Vitals:   05/18/20 1338  BP: 116/64  Pulse: 100  Temp: (!) 97.2 F (36.2 C)  TempSrc: Skin  SpO2: 98%  Weight: 148 lb 6.4 oz (67.3 kg)  Height: 5\' 2"  (1.575 m)   98% on RA BMI Readings from Last 3 Encounters:  05/18/20 27.14 kg/m  04/19/20 27.18 kg/m  03/21/20 25.24 kg/m   Wt Readings from Last 3 Encounters:  05/18/20 148 lb 6.4 oz (67.3 kg)  04/19/20 148 lb 9.6 oz (67.4 kg)  03/21/20 138 lb (62.6 kg)     CBC     Component Value Date/Time   WBC 5.5 03/21/2020 0753   RBC 4.65 03/21/2020 0753   HGB 13.7 03/21/2020 0753   HGB 14.6 02/21/2020 1229   HGB 14.1 02/09/2020 1048   HCT 42.9 03/21/2020 0753   HCT 41.2 02/09/2020 1048   PLT 273 03/21/2020 0753   PLT 426 (H) 02/21/2020 1229   PLT 393 02/09/2020 1048   MCV 92.3 03/21/2020 0753   MCV 91 02/09/2020 1048   MCH 29.5 03/21/2020 0753   MCHC 31.9 03/21/2020 0753   RDW 13.2 03/21/2020 0753   RDW 11.7 02/09/2020 1048   LYMPHSABS 2.8 02/21/2020 1229   LYMPHSABS 2.8 02/09/2020 1048   MONOABS 0.5 02/21/2020 1229   EOSABS 0.1 02/21/2020 1229   EOSABS 0.2 02/09/2020 1048   BASOSABS 0.0 02/21/2020 1229   BASOSABS 0.1 02/09/2020 1048      Chest Imaging: 02/11/2020: Chest CT Bilateral pulmonary nodules right upper lobe 2 cm left upper lobe 2.9 cm in largest cross-section. The patient's images have been independently reviewed by me.    02/23/2020: Nuclear medicine PET imaging. Right upper lobe lesion with SUV max 7.6, left upper lobe lesion SUV max 10.8. The patient's images have been independently reviewed by me.   Pulmonary Functions Testing Results: No flowsheet data found.  FeNO: None   Pathology: None  Echocardiogram: None   Heart Catheterization: None     Assessment & Plan:     ICD-10-CM   1. Abnormal PET scan, lung  R94.2   2. Abnormal findings on diagnostic imaging of lung  R91.8   3. Nodule of upper lobe of left lung  R91.1   4. Nodule of upper lobe of right lung  R91.1     Discussion:  This is a 79 year old female, former smoker, initially had abnormal CT imaging and abnormal PET scan.  Concerning for underlying malignancy due to the PET avid nature of the 2 bilateral lesions.  Patient underwent bronchoscopy which revealed negative results with nondiagnostic tissue.  The follow-up imaging revealed resolution of these lesions.  Begs a question as to what we were dealing with at the time.  Unsure if she had to just unusual  rounded opacities consistent with infection or inflammation at the time.  There are a few inflammatory lung diseases which may present with PET avid nodules differential diagnosis are likely inflammatory.  Diseases such as vasculitis, rheumatoid arthritis and sarcoidosis may present with PET avid nodules that disappear.  Most likely I believe this was infectious in nature.  No additional imaging follow-up is needed at this time.  Today we compared images as well as reviewed all of the bronchoscopy culture results.  She did grow out a unusual fungus.  I discussed this briefly with infectious disease who discussed this with a mycology colleague.  Both of which felt this was a contaminant. We discussed this in detail today.  Plan: Continue to monitor symptoms If she has any systemic symptoms or change she may warrant a repeat chest image such as a CT scan. We discussed this today in the office. We also reviewed patient's imaging and compared yesterday CT scan to her previous images.  Follow-up with [place] as needed.     Current Outpatient Medications:  .  acetaminophen (TYLENOL) 500 MG tablet, Take 1,000 mg by mouth every 6 (six) hours as needed for moderate pain. , Disp: , Rfl:  .  aspirin EC 81 MG tablet,  Take 81 mg by mouth every evening. , Disp: , Rfl:  .  Cholecalciferol 25 MCG (1000 UT) tablet, Take 1,000 Units by mouth daily. , Disp: , Rfl:  .  fluticasone (FLONASE) 50 MCG/ACT nasal spray, Place 2 sprays into both nostrils daily. (Patient taking differently: Place 2 sprays into both nostrils daily as needed for allergies. ), Disp: 16 g, Rfl: 0 .  loperamide (IMODIUM) 2 MG capsule, Take 1 mg by mouth as needed for diarrhea or loose stools (IBS-D). Takes 1/2 of 2 mg, Disp: , Rfl:  .  magnesium oxide (MAG-OX) 400 MG tablet, Take 400 mg by mouth every evening. , Disp: , Rfl:  .  meloxicam (MOBIC) 15 MG tablet, Take 15 mg by mouth daily. Take 1/2 tab every other day, Disp: , Rfl:  .  omeprazole  (PRILOSEC) 20 MG capsule, TAKE (1) CAPSULE DAILY. (Patient taking differently: Take 20 mg by mouth daily. ), Disp: 90 capsule, Rfl: 3 .  Polyethyl Glycol-Propyl Glycol (SYSTANE) 0.4-0.3 % SOLN, Place 1 drop into both eyes daily as needed (dry eyes). , Disp: , Rfl:  .  Probiotic Product (ALIGN PO), Take 1 capsule by mouth daily., Disp: , Rfl:  .  telmisartan-hydrochlorothiazide (MICARDIS HCT) 80-25 MG tablet, TAKE 1 TABLET ONCE DAILY. (Patient taking differently: Take 1 tablet by mouth daily. ), Disp: 90 tablet, Rfl: 3 .  traMADol (ULTRAM) 50 MG tablet, Take 1 tablet (50 mg total) by mouth 3 (three) times daily as needed for moderate pain or severe pain., Disp: 120 tablet, Rfl: 3  I spent 32 minutes dedicated to the care of this patient on the date of this encounter to include pre-visit review of records, face-to-face time with the patient discussing conditions above, post visit ordering of testing, clinical documentation with the electronic health record, making appropriate referrals as documented, and communicating necessary findings to members of the patients care team.    Garner Nash, [facility] 05/18/2020 1:41 PM

## 2020-05-18 NOTE — Patient Instructions (Signed)
Thank you for visiting Dr. Valeta Harms at [facility]. Today we recommend the following:  Return if symptoms worsen or fail to improve, for w/ Dr. Valeta Harms .    Please do your part to reduce the spread of COVID-19.

## 2020-05-19 NOTE — Progress Notes (Signed)
Can you let patient know CT chest showed previous pneumonia has significantly decreased in size. She has some residual scarring. No new pulmonary nodules. Atherosclerosis and coronary artery calcifications seen, recommend follow-up with PCP. Looks like she has an ap on 05/22/20 with them.

## 2020-05-22 ENCOUNTER — Encounter: Payer: Self-pay | Admitting: Family Medicine

## 2020-05-22 ENCOUNTER — Ambulatory Visit (INDEPENDENT_AMBULATORY_CARE_PROVIDER_SITE_OTHER): Payer: Medicare Other | Admitting: Family Medicine

## 2020-05-22 ENCOUNTER — Other Ambulatory Visit: Payer: Self-pay

## 2020-05-22 VITALS — BP 124/88 | Ht 62.0 in | Wt 150.0 lb

## 2020-05-22 DIAGNOSIS — E042 Nontoxic multinodular goiter: Secondary | ICD-10-CM | POA: Diagnosis not present

## 2020-05-22 DIAGNOSIS — M545 Low back pain, unspecified: Secondary | ICD-10-CM | POA: Diagnosis not present

## 2020-05-22 DIAGNOSIS — H9313 Tinnitus, bilateral: Secondary | ICD-10-CM

## 2020-05-22 DIAGNOSIS — Z23 Encounter for immunization: Secondary | ICD-10-CM | POA: Diagnosis not present

## 2020-05-22 DIAGNOSIS — Z79891 Long term (current) use of opiate analgesic: Secondary | ICD-10-CM | POA: Diagnosis not present

## 2020-05-22 DIAGNOSIS — Z0000 Encounter for general adult medical examination without abnormal findings: Secondary | ICD-10-CM | POA: Diagnosis not present

## 2020-05-22 DIAGNOSIS — I700 Atherosclerosis of aorta: Secondary | ICD-10-CM | POA: Diagnosis not present

## 2020-05-22 DIAGNOSIS — Z1231 Encounter for screening mammogram for malignant neoplasm of breast: Secondary | ICD-10-CM | POA: Diagnosis not present

## 2020-05-22 NOTE — Patient Instructions (Addendum)
It was a pleasure to see you today  For your questions regarding risk associated with aortic atherosclerosis (plaque), please follow up with a cardiologist. You may call their clinic and schedule with them directly, or call us with your request and we will place the referral.  For the back pain, please see stretching exercises below. If it gets worse or you develop problems like numbness or tingling, please let me know.  For tinnitus, please schedule with the [facility] audiology and sound therapy clinic.  Follow up with me as you need and for an annual physical in April.  Please call [facility] to schedule the mammogram.  Be Well,  Dr. Chauncey Reading

## 2020-05-26 ENCOUNTER — Other Ambulatory Visit (HOSPITAL_BASED_OUTPATIENT_CLINIC_OR_DEPARTMENT_OTHER): Payer: Self-pay | Admitting: Internal Medicine

## 2020-05-26 ENCOUNTER — Ambulatory Visit: Payer: Medicare Other | Attending: Internal Medicine

## 2020-05-26 DIAGNOSIS — Z23 Encounter for immunization: Secondary | ICD-10-CM

## 2020-05-26 LAB — TOXASSURE SELECT 13 (MW), URINE

## 2020-06-02 MED FILL — PFIZER-BIONTECH COVID-19 VA: 30 | 1 days supply | Qty: 0 | Fill #0

## 2020-06-12 ENCOUNTER — Encounter: Payer: Self-pay | Admitting: Family Medicine

## 2020-06-12 DIAGNOSIS — E042 Nontoxic multinodular goiter: Secondary | ICD-10-CM | POA: Insufficient documentation

## 2020-06-12 DIAGNOSIS — H9313 Tinnitus, bilateral: Secondary | ICD-10-CM | POA: Insufficient documentation

## 2020-06-12 DIAGNOSIS — M545 Low back pain, unspecified: Secondary | ICD-10-CM | POA: Insufficient documentation

## 2020-06-12 DIAGNOSIS — I700 Atherosclerosis of aorta: Secondary | ICD-10-CM | POA: Insufficient documentation

## 2020-06-12 DIAGNOSIS — Z0000 Encounter for general adult medical examination without abnormal findings: Secondary | ICD-10-CM | POA: Insufficient documentation

## 2020-06-12 NOTE — Progress Notes (Signed)
SUBJECTIVE:   CHIEF COMPLAINT / HPI: many concerns  Atheroslcerosis: patient noticed on the CT reports she had from pulmonary nodules in August that aortic atheroslcerosis was noted by the radiologist. She is very concerned about her risk for heart attack and stroke. Her ASCVD risk is ~20%. I have discussed this with the patient before and have recommended a statin therapy as best protective course for her level of ASCVD given age >78 yo, this may have benefit for her as LDL >70. Patient is adamant that she does not want statin therapy. She previously tried a statin, and was told that side effects were the most minimal with that particular formulation. Unfortunately, she subsequently had a perforated appendicitis and was extremely ill. This event occurred shortly after starting the statin. I do not know of any correlation of statins and appendicitis and cannot find mention of it in drug literature. Most likely this was an unrelated and unfortunately timed event, because it is now linked for the patient.  Patient asked me how bad her atherosclerosis is, and that is not an answer I can give her extrapolating from the CT. She has read about calcium scoring and is interested in pursuing this. She requests to be seen by a cardiologist so she can discuss at length the benefit of various medication options that are not statins and how best to interpret and score her personal risk with atherosclerotic disease. Patient also reports that she is very upset about aspirin. She was told for years by other physicians that she should be on ASA, and recently read that she should not be on it. My impression was that she was upset that there was a change in the recommendation and this had not been mentioned to her. I discussed with her that there was not good data for primary prevention of ASCVD in patients >78 yo. I recommend that she stop ASA, but she is uncomfortable with this idea and prefers to discuss further with  cardiologist.  Regarding her lung nodules, she has no further respiratory symptoms. Thankfully these turned out to be inflammatory in nature, although it is unclear what the exact etiology was. Thankfully, they were not malignant as was the most likely initial diagnosis. She reports that she is still very upset that she had to go through such a disturbing experience. And that she was overwhelmed with health concerns with her thyroid as well. Thankfully, these were also benign and according to [facility] recommendations, patient should have a repeat thyroid US in 1 year. I discussed this recommendation with her.  Back Pain: patient reports back pain in her low back that is worse after moving something recently. She has spasming muscles, and has had to use more of her tramadol than usual. No saddle anesthesia, no incontinence.   Tinnitus: patient reports that she has had tinnitus in both ears for a long time, but has noticed that the sound has become more bothersome recently. She denies any pulsatile quality, low pitched constant, but louder recently. She has not had audiologic testing in a long time, no history of TMJ.   PERTINENT  PMH / PSH: HTN, HLD, OA, thyroid nodules  OBJECTIVE:   BP 124/88   Ht 5\' 2"  (1.575 m)   Wt 150 lb (68 kg)   BMI 27.44 kg/m   Physical Exam Vitals and nursing note reviewed.  Constitutional:      General: She is not in acute distress.    Appearance: Normal appearance. She is normal  weight. She is not ill-appearing, toxic-appearing or diaphoretic.  HENT:     Head: Normocephalic and atraumatic.     Right Ear: Tympanic membrane, ear canal and external ear normal.     Left Ear: Tympanic membrane, ear canal and external ear normal.     Nose: Nose normal. No congestion or rhinorrhea.     Mouth/Throat:     Mouth: Mucous membranes are moist.     Pharynx: Oropharynx is clear.  Eyes:     Extraocular Movements: Extraocular movements intact.     Conjunctiva/sclera:  Conjunctivae normal.     Pupils: Pupils are equal, round, and reactive to light.  Neck:     Vascular: No carotid bruit.  Cardiovascular:     Rate and Rhythm: Normal rate and regular rhythm.     Pulses: Normal pulses.     Heart sounds: Normal heart sounds.  Pulmonary:     Effort: Pulmonary effort is normal.     Breath sounds: Normal breath sounds.  Musculoskeletal:        General: Tenderness present.     Cervical back: Normal range of motion. No rigidity.     Comments: Mild tenderness over left paraspinal muscle approx L4-L5, SLR (-) bilaterally, no stepoffs on central column  Lymphadenopathy:     Cervical: No cervical adenopathy.  Skin:    General: Skin is warm and dry.  Neurological:     General: No focal deficit present.     Mental Status: She is alert and oriented to person, place, and time. Mental status is at baseline.     Cranial Nerves: No cranial nerve deficit.     Sensory: No sensory deficit.     Motor: No weakness.     Gait: Gait normal.  Psychiatric:        Mood and Affect: Mood normal.      ASSESSMENT/PLAN:   Atherosclerosis of aorta (HCC) Patient is 79 yo w/ remote history of smoking (quit in 1970s), not on statin therapy, total cholesterol 193, HDL 44, LDL 99. Atherosclerosis seen on thoracic aorta as well as calcified coronary arteries, which was concerning to patient who will not use statins. She requests referral to cardiology for coronary artery calcium scoring and potential other therapies.  - Referral to cardiology - Recommended to stop ASA, please see whole note for details  Tinnitus aurium, bilateral Patient with long history of tinnitus, no red flags, bilateral. More bothersome of late with no recent audiologic testing. Referred to [facility] audiology and tinnitus clinic for testing and therapy.  Low back pain Most likely musculoligamentous in nature. Recommended stretching routine and conservative methods to bring relief. If any red flags appear or  worsening of symptoms, patient to return to care.  Healthcare maintenance At patient's age she was intially ok with stopping mammograms, but after recent health scares would prefer to have one more mammogram before stopping.  Multiple thyroid nodules Repeat US of left inferior thyroid nodule in 03/2021. Benign.     Gladys Damme, MD [facility]

## 2020-06-12 NOTE — Assessment & Plan Note (Signed)
At patient's age she was intially ok with stopping mammograms, but after recent health scares would prefer to have one more mammogram before stopping.

## 2020-06-12 NOTE — Assessment & Plan Note (Signed)
Patient is 79 yo w/ remote history of smoking (quit in 1970s), not on statin therapy, total cholesterol 193, HDL 44, LDL 99. Atherosclerosis seen on thoracic aorta as well as calcified coronary arteries, which was concerning to patient who will not use statins. She requests referral to cardiology for coronary artery calcium scoring and potential other therapies.  - Referral to cardiology - Recommended to stop ASA, please see whole note for details

## 2020-06-12 NOTE — Assessment & Plan Note (Signed)
Patient with long history of tinnitus, no red flags, bilateral. More bothersome of late with no recent audiologic testing. Referred to [facility]iology and tinnitus clinic for testing and therapy.

## 2020-06-12 NOTE — Assessment & Plan Note (Signed)
Repeat US of left inferior thyroid nodule in 03/2021. Benign.

## 2020-06-12 NOTE — Assessment & Plan Note (Signed)
Most likely musculoligamentous in nature. Recommended stretching routine and conservative methods to bring relief. If any red flags appear or worsening of symptoms, patient to return to care.

## 2020-07-17 ENCOUNTER — Other Ambulatory Visit: Payer: Self-pay | Admitting: *Deleted

## 2020-07-17 DIAGNOSIS — M19042 Primary osteoarthritis, left hand: Secondary | ICD-10-CM

## 2020-07-17 MED ORDER — TRAMADOL HCL 50 MG PO TABS: 50.0000 mg | ORAL_TABLET | Freq: Three times a day (TID) | ORAL | 3 refills | Status: DC | PRN

## 2020-07-20 ENCOUNTER — Other Ambulatory Visit: Payer: Self-pay | Admitting: Family Medicine

## 2020-07-20 DIAGNOSIS — M19041 Primary osteoarthritis, right hand: Secondary | ICD-10-CM

## 2020-07-20 DIAGNOSIS — M19042 Primary osteoarthritis, left hand: Secondary | ICD-10-CM

## 2020-10-05 ENCOUNTER — Other Ambulatory Visit: Payer: Self-pay | Admitting: Family Medicine

## 2020-10-05 DIAGNOSIS — M19041 Primary osteoarthritis, right hand: Secondary | ICD-10-CM

## 2020-11-02 ENCOUNTER — Encounter: Payer: Self-pay | Admitting: Family Medicine

## 2020-11-09 ENCOUNTER — Ambulatory Visit: Payer: Medicare Other | Attending: Internal Medicine

## 2020-11-09 DIAGNOSIS — Z23 Encounter for immunization: Secondary | ICD-10-CM

## 2020-11-09 NOTE — Progress Notes (Signed)
   Covid-19 Vaccination Clinic  Name:  Jessica Beck    MRN: [id number] DOB: [birth year]  11/09/2020  [name] was observed post Covid-19 immunization for 15 minutes without incident. She was provided with Vaccine Information Sheet and instruction to access the V-Safe system.   [name] was instructed to call 911 with any severe reactions post vaccine: Marland Kitchen Difficulty breathing  . Swelling of face and throat  . A fast heartbeat  . A bad rash all over body  . Dizziness and weakness   Immunizations Administered    Name Date Dose VIS Date Route   PFIZER Comrnaty(Gray TOP) Covid-19 Vaccine 11/09/2020  1:14 PM 0.3 mL 07/13/2020 Intramuscular   Manufacturer: [employer]   Lot: [id number]   [place]: [phone]

## 2020-11-16 ENCOUNTER — Other Ambulatory Visit (HOSPITAL_BASED_OUTPATIENT_CLINIC_OR_DEPARTMENT_OTHER): Payer: Self-pay

## 2020-11-16 MED ORDER — PFIZER-BIONT COVID-19 VAC-TRIS 30 MCG/0.3ML IM SUSP
INTRAMUSCULAR | 0 refills | Status: DC
  Filled 2020-11-16: qty 0.3, 1d supply, fill #0

## 2020-11-17 ENCOUNTER — Other Ambulatory Visit (HOSPITAL_BASED_OUTPATIENT_CLINIC_OR_DEPARTMENT_OTHER): Payer: Self-pay

## 2020-12-15 ENCOUNTER — Other Ambulatory Visit: Payer: Self-pay | Admitting: Family Medicine

## 2020-12-15 DIAGNOSIS — M19042 Primary osteoarthritis, left hand: Secondary | ICD-10-CM

## 2020-12-15 MED ORDER — TRAMADOL HCL 50 MG PO TABS: 50.0000 mg | ORAL_TABLET | Freq: Three times a day (TID) | ORAL | 1 refills | Status: DC | PRN

## 2021-01-02 DIAGNOSIS — Z1231 Encounter for screening mammogram for malignant neoplasm of breast: Secondary | ICD-10-CM | POA: Diagnosis not present

## 2021-01-09 ENCOUNTER — Other Ambulatory Visit: Payer: Self-pay | Admitting: Family Medicine

## 2021-01-09 DIAGNOSIS — I10 Essential (primary) hypertension: Secondary | ICD-10-CM

## 2021-01-11 NOTE — Telephone Encounter (Signed)
Opened in error

## 2021-01-23 ENCOUNTER — Other Ambulatory Visit: Payer: Self-pay | Admitting: Family Medicine

## 2021-05-13 ENCOUNTER — Encounter: Payer: Self-pay | Admitting: Family Medicine

## 2021-05-13 ENCOUNTER — Telehealth: Payer: Self-pay

## 2021-05-13 NOTE — Telephone Encounter (Signed)
Pt c/o abscessed tooth and hip pain. Pt concerned that infection may have moved to the hip. Pt asking if she should go ahead with getting covid shot.  Advised pt to call or send MyChart message to her PCP. Advised pt to call back if her PCP asks her to cancel the appt.  Pt verbalized understanding.

## 2021-05-14 ENCOUNTER — Other Ambulatory Visit: Payer: Self-pay

## 2021-05-14 ENCOUNTER — Ambulatory Visit (INDEPENDENT_AMBULATORY_CARE_PROVIDER_SITE_OTHER): Payer: Medicare Other | Admitting: Family Medicine

## 2021-05-14 ENCOUNTER — Encounter: Payer: Self-pay | Admitting: Family Medicine

## 2021-05-14 VITALS — BP 148/86 | HR 96 | Ht 62.0 in | Wt 154.6 lb

## 2021-05-14 DIAGNOSIS — K0889 Other specified disorders of teeth and supporting structures: Secondary | ICD-10-CM

## 2021-05-14 DIAGNOSIS — M25551 Pain in right hip: Secondary | ICD-10-CM

## 2021-05-14 NOTE — Progress Notes (Signed)
    SUBJECTIVE:   CHIEF COMPLAINT / HPI:   Dental pain Patient has a history of issues with dentists and subsequently has significant fear. Several of her teeth have chipped and cracked. Patient has not had any fevers, chills, fatigue. Patient has had pain intermittently but it has worsened in the last week.  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 She is concerned that this could be connected, though notes no swelling, erythema. She is having more difficulty with ambulation due to the pain, but no pain at rest.  PERTINENT  PMH / PSH: Reviewed  OBJECTIVE:   BP (!) 148/86   Pulse 96   Ht 5\' 2"  (1.575 m)   Wt 154 lb 9.6 oz (70.1 kg)   SpO2 97%   BMI 28.28 kg/m   Gen: well-appearing, NAD CV: RRR, no m/r/g appreciated, no peripheral edema Pulm: CTAB, no wheezes/crackles HEENT: left molar with significant damage and a large whole in the tooth, no erythema, swelling, or drainage from the area. Does not appear infected at this time. Right Hip:  - Inspection: No gross deformity, no swelling - Palpation: TTP over trochanteric bursa and in the gluteal muscles. - ROM: Normal range of motion on Flexion, extension, abduction, internal and external rotation - Strength: Normal strength. - Neuro/vasc: NV intact distally - Special Tests: Negative FABER and FADIR.     ASSESSMENT/PLAN:   Dental Pain Left molar with significant damage, no signs of infection on physical exam and patient afebrile. Will defer antibiotics unless there are signs of infection, counseled patient on return precautions. Dentistry list provided to patient.  Right hip pain Patient with right hip pain that has worsened in the last week, having difficulty with ambulation and ambulating with a cane. No known trauma. 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 ROM is not severely affected by pain and there is  no sign of a septic joint on examination.  Patient does have a history of bursitis, which is present on exam. Patient plans to follow-up with her orthopedist regarding the pain and will contact our office if they are in need of a new referral.  Rise Patience, [place]

## 2021-05-14 NOTE — Patient Instructions (Signed)
Your tooth does not look infected at this time so we do not need antibiotics right now. I recommend following up with a dentist as soon as you can.   You can also call your previous orthopedist and see if they will see you or require another referral, in which case just let our office know and we can get one for you.  You can get your COVID vaccine as planned.

## 2021-05-17 ENCOUNTER — Ambulatory Visit: Payer: Medicare Other | Attending: Internal Medicine

## 2021-05-17 ENCOUNTER — Other Ambulatory Visit (HOSPITAL_BASED_OUTPATIENT_CLINIC_OR_DEPARTMENT_OTHER): Payer: Self-pay

## 2021-05-17 DIAGNOSIS — Z23 Encounter for immunization: Secondary | ICD-10-CM

## 2021-05-17 MED ORDER — INFLUENZA VAC A&B SA ADJ QUAD 0.5 ML IM PRSY
PREFILLED_SYRINGE | INTRAMUSCULAR | 0 refills | Status: DC
  Filled 2021-05-17: qty 0.5, 1d supply, fill #0

## 2021-05-17 MED ORDER — PFIZER COVID-19 VAC BIVALENT 30 MCG/0.3ML IM SUSP
INTRAMUSCULAR | 0 refills | Status: DC
  Filled 2021-05-17: qty 0.3, 1d supply, fill #0

## 2021-05-17 NOTE — Progress Notes (Signed)
   Covid-19 Vaccination Clinic  Name:  Jessica Beck    MRN: [id number] DOB: Apr 22, 1941  05/17/2021  Jessica Beck was observed post Covid-19 immunization for 15 minutes without incident. She was provided with Vaccine Information Sheet and instruction to access the V-Safe system.   Jessica Beck was instructed to call 911 with any severe reactions post vaccine: Difficulty breathing  Swelling of face and throat  A fast heartbeat  A bad rash all over body  Dizziness and weakness

## 2021-07-16 ENCOUNTER — Other Ambulatory Visit: Payer: Self-pay | Admitting: Family Medicine

## 2021-07-16 DIAGNOSIS — I10 Essential (primary) hypertension: Secondary | ICD-10-CM

## 2021-07-16 DIAGNOSIS — M19042 Primary osteoarthritis, left hand: Secondary | ICD-10-CM

## 2021-07-16 DIAGNOSIS — M19041 Primary osteoarthritis, right hand: Secondary | ICD-10-CM

## 2021-08-27 ENCOUNTER — Encounter: Payer: Self-pay | Admitting: Family Medicine

## 2021-08-28 ENCOUNTER — Telehealth: Payer: Self-pay | Admitting: *Deleted

## 2021-08-28 NOTE — Telephone Encounter (Signed)
LMOVM for pt to return call to discuss issue reported in [place] message.  Christen Bame, CMA

## 2021-09-27 ENCOUNTER — Other Ambulatory Visit: Payer: Self-pay | Admitting: Family Medicine

## 2021-09-27 DIAGNOSIS — M19041 Primary osteoarthritis, right hand: Secondary | ICD-10-CM

## 2021-10-02 ENCOUNTER — Ambulatory Visit (INDEPENDENT_AMBULATORY_CARE_PROVIDER_SITE_OTHER): Payer: Medicare Other

## 2021-10-02 ENCOUNTER — Other Ambulatory Visit: Payer: Self-pay

## 2021-10-02 VITALS — BP 152/84 | HR 85 | Ht 62.0 in | Wt 152.0 lb

## 2021-10-02 DIAGNOSIS — Z0000 Encounter for general adult medical examination without abnormal findings: Secondary | ICD-10-CM | POA: Diagnosis not present

## 2021-10-02 NOTE — Progress Notes (Addendum)
Subjective:   Jessica Beck is a 81 y.o. female who presents for Medicare Annual (Subsequent) preventive examination.  Review of Systems: Defer to PCP.  Cardiac Risk Factors include: advanced age (>72men, >16 women);hypertension  Objective:   Vitals: BP (!) 152/84    Pulse 85    Ht 5\' 2"  (1.575 m)    Wt 152 lb (68.9 kg)    SpO2 96%    BMI 27.80 kg/m   Body mass index is 27.8 kg/m.  Advanced Directives 10/09/2021 10/02/2021 05/14/2021 05/22/2020 03/21/2020 02/21/2020 02/21/2020  Does Patient Have a Medical Advance Directive? Yes Yes No Yes Yes Yes Yes  Type of Paramedic of [place];Living will Healthcare Power of [place];Living will Healthcare Power of [place];Living will  Does patient want to make changes to medical advance directive? No - Patient declined No - Patient declined - - - - -  Copy of [place] in Chart? Yes - validated most recent copy scanned in chart (See row information) - - Yes - validated most recent copy scanned in chart (See row information) No - copy requested - No - copy requested  Would patient like information on creating a medical advance directive? - - No - Patient declined - - - -   Tobacco Social History   Tobacco Use  Smoking Status Former   Years: 20.00   Types: Cigarettes   Quit date: 08/05/1974   Years since quitting: 47.2   Passive exposure: Past  Smokeless Tobacco Never     Counseling given: No plans to restart.  Clinical Intake:  Pre-visit preparation completed: Yes  Pain : 0-10 Pain Score: 5  Pain Location: Neck Pain Onset: 1 to 4 weeks ago Pain Frequency: Constant  Nutritional Status: BMI 25 -29 Overweight Diabetes: No  How often do you need to have someone help you when you read instructions, pamphlets, or other written materials from your doctor or pharmacy?: 1 - Never What is the last grade level  you completed in school?: Masters in [place]?: No  Past Medical History:  Diagnosis Date   Anxiety    situ   Arthritis    osteo    Asthma    mild   Complication of anesthesia    general - long time to wake up.  Takes a longer than  usual time to wake up after colonoscopy.   Gall bladder stones    present   GERD (gastroesophageal reflux disease)    Hypertension    IBS (irritable bowel syndrome)    with diarrhea   Migraine 03/07/2017   03/20/20- "mild"   Multiple thyroid nodules    Past Surgical History:  Procedure Laterality Date   BRONCHIAL BIOPSY  03/21/2020   Procedure: BRONCHIAL BIOPSIES;  Surgeon: Garner Nash, DO;  Location: [facility];  Service: Pulmonary;;   BRONCHIAL BRUSHINGS  03/21/2020   Procedure: BRONCHIAL BRUSHINGS;  Surgeon: Garner Nash, DO;  Location: [facility];  Service: Pulmonary;;   BRONCHIAL NEEDLE ASPIRATION BIOPSY  03/21/2020   Procedure: BRONCHIAL NEEDLE ASPIRATION BIOPSIES;  Surgeon: Garner Nash, DO;  Location: [facility];  Service: Pulmonary;;   BRONCHIAL WASHINGS  03/21/2020   Procedure: BRONCHIAL WASHINGS;  Surgeon: Garner Nash, DO;  Location: [facility];  Service: Pulmonary;;   HIP RESECTION ARTHROPLASTY Right    KNEE ARTHROPLASTY Right    KNEE ARTHROPLASTY Left    LAPAROSCOPIC APPENDECTOMY  N/A 09/14/2016   Procedure: APPENDECTOMY LAPAROSCOPIC;  Surgeon: Armandina Gemma, MD;  Location: [facility];  Service: General;  Laterality: N/A;   VIDEO BRONCHOSCOPY WITH ENDOBRONCHIAL NAVIGATION Right 03/21/2020   Procedure: VIDEO BRONCHOSCOPY WITH ENDOBRONCHIAL NAVIGATION;  Surgeon: Garner Nash, DO;  Location: [facility];  Service: Pulmonary;  Laterality: Right;   Family History  Problem Relation Age of Onset   Hypertension Mother    Stroke Mother    COPD Father    Cancer Sister    Social History   Socioeconomic History   Marital status: Divorced    Spouse name: Not on file   Number of children: 0   Years of  education: 16   Highest education level: Master's degree (e.g., MA, MS, MEng, MEd, MSW, MBA)  Occupational History   Occupation: Retired  Tobacco Use   Smoking status: Former    Years: 20.00    Types: Cigarettes    Quit date: 08/05/1974    Years since quitting: 47.2    Passive exposure: Past   Smokeless tobacco: Never  Vaping Use   Vaping Use: Never used  Substance and Sexual Activity   Alcohol use: Yes    Alcohol/week: 5.0 standard drinks    Types: 5 Glasses of wine per week   Drug use: Never   Sexual activity: Not Currently  Other Topics Concern   Not on file  Social History Narrative   Patient lives alone.   Patient enjoys walking her dog and being outside.    Writing, traveling and dinning with friends.    Exercises daily.    Social Determinants of Health   Financial Resource Strain: Low Risk    Difficulty of Paying Living Expenses: Not hard at all  Food Insecurity: No Food Insecurity   Worried About Charity fundraiser in the Last Year: Never true   [place] in the Last Year: Never true  Transportation Needs: No Transportation Needs   Lack of Transportation (Medical): No   Lack of Transportation (Non-Medical): No  Physical Activity: Sufficiently Active   Days of Exercise per Week: 7 days   Minutes of Exercise per Session: 30 min  Stress: No Stress Concern Present   Feeling of Stress : Only a little  Social Connections: Socially Isolated   Frequency of Communication with Friends and Family: More than three times a week   Frequency of Social Gatherings with Friends and Family: More than three times a week   Attends Religious Services: Never   Marine scientist or Organizations: No   Attends Archivist Meetings: Never   Marital Status: Divorced   Outpatient Encounter Medications as of 10/02/2021  Medication Sig   acetaminophen (TYLENOL) 500 MG tablet Take 650 mg by mouth every 8 (eight) hours as needed for moderate pain. Two per day   aspirin  EC 81 MG tablet Take 81 mg by mouth every evening.    Cholecalciferol 25 MCG (1000 UT) tablet Take 1,000 Units by mouth daily.    diphenhydrAMINE HCl (BENADRYL DYE-FREE ALLERGY PO) Take by mouth at bedtime.   fluticasone (FLONASE) 50 MCG/ACT nasal spray Place 2 sprays into both nostrils daily. (Patient taking differently: Place 2 sprays into both nostrils daily as needed for allergies.)   loperamide (IMODIUM) 2 MG capsule Take 1 mg by mouth as needed for diarrhea or loose stools (IBS-D). Takes 1/2 of 2 mg   magnesium oxide (MAG-OX) 400 MG tablet Take 400 mg by mouth every evening.  meloxicam (MOBIC) 15 MG tablet Take 15 mg by mouth daily. Take 1/2 tab every other day   Omega-3 Fatty Acids (FISH OIL) 1000 MG CAPS Take 1 capsule by mouth daily.   omeprazole (PRILOSEC) 20 MG capsule TAKE (1) CAPSULE DAILY.   Polyethyl Glycol-Propyl Glycol 0.4-0.3 % SOLN Place 1 drop into both eyes daily as needed (dry eyes).    Probiotic Product (ALIGN PO) Take 1 capsule by mouth daily.   telmisartan-hydrochlorothiazide (MICARDIS HCT) 80-25 MG tablet TAKE ONE TABLET BY MOUTH DAILY   traMADol (ULTRAM) 50 MG tablet Take 1 tablet by mouth 3 times daily as needed for moderate pain or severe pain.   COVID-19 mRNA bivalent vaccine, Pfizer, (PFIZER COVID-19 VAC BIVALENT) injection Inject into the muscle. (Patient not taking: Reported on 10/02/2021)   COVID-19 mRNA Vac-TriS, Pfizer, (PFIZER-BIONT COVID-19 VAC-TRIS) SUSP injection Inject into the muscle. (Patient not taking: Reported on 10/02/2021)   influenza vaccine adjuvanted (FLUAD) 0.5 ML injection Inject into the muscle. (Patient not taking: Reported on 10/02/2021)   No facility-administered encounter medications on file as of 10/02/2021.   Activities of Daily Living In your present state of health, do you have any difficulty performing the following activities: 10/02/2021  Hearing? N  Vision? N  Difficulty concentrating or making decisions? N  Walking or climbing  stairs? Y  Dressing or bathing? N  Doing errands, shopping? N  Preparing Food and eating ? N  Using the Toilet? N  In the past six months, have you accidently leaked urine? Y  Do you have problems with loss of bowel control? N  Managing your Medications? N  Managing your Finances? N  Housekeeping or managing your Housekeeping? N  Some recent data might be hidden   Patient Care Team: Gladys Damme, MD as PCP - General    Assessment:   This is a routine wellness examination for Anushri.  Exercise Activities and Dietary recommendations Current Exercise Habits: Home exercise routine, Type of exercise: walking, Time (Minutes): 30, Frequency (Times/Week): 7, Weekly Exercise (Minutes/Week): 210, Exercise limited by: orthopedic condition(s);cardiac condition(s)   Goals      Blood Pressure < 140/90       Fall Risk Fall Risk  10/02/2021 05/22/2020 02/08/2020 11/08/2019 06/25/2019  Falls in the past year? 0 0 0 0 0  Comment - - - - Emmi Telephone Survey: data to providers prior to load  Number falls in past yr: 0 - 0 0 -  Injury with Fall? 0 - - 0 -  Risk for fall due to : Orthopedic patient;Other (Comment) - - - -  Follow up Falls prevention discussed - Falls evaluation completed - -   Abnormal gait and balance due to orthopedic issues. Patient uses a cane to ambulate.  Is the patient's home free of loose throw rugs in walkways, pet beds, electrical cords, etc?   yes      Grab bars in the bathroom? yes      Handrails on the stairs?   yes      Adequate lighting?   yes  Patient rating of health (0-10) scale: 8   Depression Screen PHQ 2/9 Scores 10/02/2021 05/14/2021 05/22/2020 02/08/2020  PHQ - 2 Score 0 0 0 0  PHQ- 9 Score - 2 2 -    Cognitive Function 6CIT Screen 10/02/2021  What Year? 0 points  What month? 0 points  What time? 0 points  Count back from 20 0 points  Months in reverse 0 points  Repeat phrase 0  points  Total Score 0   Immunization History  Administered  Date(s) Administered   Fluad Quad(high Dose 65+) 05/26/2019, 05/22/2020, 05/17/2021   Influenza, High Dose Seasonal PF 07/10/2015   Influenza, Seasonal, Injecte, Preservative Fre 04/23/2016   Influenza,inj,Quad PF,6+ Mos 05/22/2017, 05/13/2018   Influenza-Unspecified 05/05/2014   PFIZER Comirnaty(Gray Top)Covid-19 Tri-Sucrose Vaccine 11/09/2020   PFIZER(Purple Top)SARS-COV-2 Vaccination 09/10/2019, 10/05/2019, 05/26/2020   Pfizer Covid-19 Vaccine Bivalent Booster 13yrs & up 05/17/2021   Pneumococcal Conjugate-13 08/18/2015   Pneumococcal Polysaccharide-23 08/06/2007   Zoster Recombinat (Shingrix) 01/03/2020   Zoster, Live 08/06/2006   Screening Tests Health Maintenance  Topic Date Due   Hepatitis C Screening  Never done   TETANUS/TDAP  Never done   Zoster Vaccines- Shingrix (2 of 2) 02/28/2020   Pneumonia Vaccine 25+ Years old  Completed   INFLUENZA VACCINE  Completed   DEXA SCAN  Completed   COVID-19 Vaccine  Completed   HPV VACCINES  Aged Out   Cancer Screenings: Lung: Low Dose CT Chest recommended if Age 45-80 years, 20 pack-year currently smoking OR have quit w/in 15years. Patient does not qualify. Breast:  Up to date on Mammogram? Yes  01/02/2021 Up to date of Bone Density/Dexa? Yes Colorectal: No longer recommended  Plan:  Will send refill requests we discussed to PCP.  Will request Cardiology referral as discussed. Followup with PCP as needed. #2 Shingles due- you can get this at your local pharmacy.   I have personally reviewed and noted the following in the patients chart:   Medical and social history Use of alcohol, tobacco or illicit drugs  Current medications and supplements Functional ability and status Nutritional status Physical activity Advanced directives List of other physicians Hospitalizations, surgeries, and ER visits in previous 12 months Vitals Screenings to include cognitive, depression, and falls Referrals and appointments  In addition, I  have reviewed and discussed with patient certain preventive protocols, quality metrics, and best practice recommendations. A written personalized care plan for preventive services as well as general preventive health recommendations were provided to patient.  Dorna Bloom, [place]  10/09/2021  Needs PCP visit. Messaged CMA to schedule.  I have reviewed this visit and agree with the documentation.

## 2021-10-09 ENCOUNTER — Other Ambulatory Visit: Payer: Self-pay

## 2021-10-09 ENCOUNTER — Telehealth: Payer: Self-pay

## 2021-10-09 DIAGNOSIS — J302 Other seasonal allergic rhinitis: Secondary | ICD-10-CM

## 2021-10-09 MED ORDER — FLUTICASONE PROPIONATE 50 MCG/ACT NA SUSP: 2.0000 | Freq: Every day | NASAL | 11 refills | Status: AC | PRN

## 2021-10-09 MED ORDER — MELOXICAM 15 MG PO TABS: 15.0000 mg | ORAL_TABLET | Freq: Every day | ORAL | 3 refills | Status: DC

## 2021-10-09 NOTE — Telephone Encounter (Signed)
I did a AWV with patient.  ? ?Patient requesting refills on Meloxicam and Flonase. Patient also requesting refills on Triamcinolone Cream, however I do not see this on current medication list.  ? ?Patient also requesting a referral to Cardiologist, a woman provider.  ? ?Will forward to PCP.  ? ?Patient plans to schedule an apt. ?

## 2021-10-09 NOTE — Patient Instructions (Addendum)
You spoke to Jessica Beck, [place] for your annual wellness visit.  We discussed goals:   Goals      Blood Pressure < 140/90       We also discussed recommended health maintenance. Please call our office and schedule a visit. As discussed, you are due for: Health Maintenance  Topic Date Due   Hepatitis C Screening  Never done   TETANUS/TDAP  Never done   Zoster Vaccines- Shingrix (2 of 2) 02/28/2020   Pneumonia Vaccine 57+ Years old  Completed   INFLUENZA VACCINE  Completed   DEXA SCAN  Completed   COVID-19 Vaccine  Completed   HPV VACCINES  Aged Out   Will send refill requests we discussed to PCP.  Will request Cardiology referral as discussed. Followup with PCP as needed. #2 Shingles due- you can get this at your local pharmacy.   Preventive Care 36 Years and Older, Female Preventive care refers to lifestyle choices and visits with your health care provider that can promote health and wellness. Preventive care visits are also called wellness exams. What can I expect for my preventive care visit? Counseling Your health care provider may ask you questions about your: Medical history, including: Past medical problems. Family medical history. Pregnancy and menstrual history. History of falls. Current health, including: Memory and ability to understand (cognition). Emotional well-being. Home life and relationship well-being. Sexual activity and sexual health. Lifestyle, including: Alcohol, nicotine or tobacco, and drug use. Access to firearms. Diet, exercise, and sleep habits. Work and work Statistician. Sunscreen use. Safety issues such as seatbelt and bike helmet use. Physical exam Your health care provider will check your: Height and weight. These may be used to calculate your BMI (body mass index). BMI is a measurement that tells if you are at a healthy weight. Waist circumference. This measures the distance around your waistline. This measurement also tells if you  are at a healthy weight and may help predict your risk of certain diseases, such as type 2 diabetes and high blood pressure. Heart rate and blood pressure. Body temperature. Skin for abnormal spots. What immunizations do I need? Vaccines are usually given at various ages, according to a schedule. Your health care provider will recommend vaccines for you based on your age, medical history, and lifestyle or other factors, such as travel or where you work. What tests do I need? Screening Your health care provider may recommend screening tests for certain conditions. This may include: Lipid and cholesterol levels. Hepatitis C test. Hepatitis B test. HIV (human immunodeficiency virus) test. STI (sexually transmitted infection) testing, if you are at risk. Lung cancer screening. Colorectal cancer screening. Diabetes screening. This is done by checking your blood sugar (glucose) after you have not eaten for a while (fasting). Mammogram. Talk with your health care provider about how often you should have regular mammograms. BRCA-related cancer screening. This may be done if you have a family history of breast, ovarian, tubal, or peritoneal cancers. Bone density scan. This is done to screen for osteoporosis. Talk with your health care provider about your test results, treatment options, and if necessary, the need for more tests. Follow these instructions at home: Eating and drinking  Eat a diet that includes fresh fruits and vegetables, whole grains, lean protein, and low-fat dairy products. Limit your intake of foods with high amounts of sugar, saturated fats, and salt. Take vitamin and mineral supplements as recommended by your health care provider. Do not drink alcohol if your health care  provider tells you not to drink. If you drink alcohol: Limit how much you have to 0-1 drink a day. Know how much alcohol is in your drink. In the [place] one drink equals one 12 oz bottle of beer (355 mL), one  5 oz glass of wine (148 mL), or one 1 oz glass of hard liquor (44 mL). Lifestyle Brush your teeth every morning and night with fluoride toothpaste. Floss one time each day. Exercise for at least 30 minutes 5 or more days each week. Do not use any products that contain nicotine or tobacco. These products include cigarettes, chewing tobacco, and vaping devices, such as e-cigarettes. If you need help quitting, ask your health care provider. Do not use drugs. If you are sexually active, practice safe sex. Use a condom or other form of protection in order to prevent STIs. Take aspirin only as told by your health care provider. Make sure that you understand how much to take and what form to take. Work with your health care provider to find out whether it is safe and beneficial for you to take aspirin daily. Ask your health care provider if you need to take a cholesterol-lowering medicine (statin). Find healthy ways to manage stress, such as: Meditation, yoga, or listening to music. Journaling. Talking to a trusted person. Spending time with friends and family. Minimize exposure to UV radiation to reduce your risk of skin cancer. Safety Always wear your seat belt while driving or riding in a vehicle. Do not drive: If you have been drinking alcohol. Do not ride with someone who has been drinking. When you are tired or distracted. While texting. If you have been using any mind-altering substances or drugs. Wear a helmet and other protective equipment during sports activities. If you have firearms in your house, make sure you follow all gun safety procedures. What's next? Visit your health care provider once a year for an annual wellness visit. Ask your health care provider how often you should have your eyes and teeth checked. Stay up to date on all vaccines. This information is not intended to replace advice given to you by your health care provider. Make sure you discuss any questions you have  with your health care provider. Document Revised: 01/17/2021 Document Reviewed: 01/17/2021 Elsevier Patient Education  2022 [place] Prevention in the Home, Adult Falls can cause injuries and can happen to people of all ages. There are many things you can do to make your home safe and to help prevent falls. Ask for help when making these changes. What actions can I take to prevent falls? General Instructions Use good lighting in all rooms. Replace any light bulbs that burn out. Turn on the lights in dark areas. Use night-lights. Keep items that you use often in easy-to-reach places. Lower the shelves around your home if needed. Set up your furniture so you have a clear path. Avoid moving your furniture around. Do not have throw rugs or other things on the floor that can make you trip. Avoid walking on wet floors. If any of your floors are uneven, fix them. Add color or contrast paint or tape to clearly mark and help you see: Grab bars or handrails. First and last steps of staircases. Where the edge of each step is. If you use a stepladder: Make sure that it is fully opened. Do not climb a closed stepladder. Make sure the sides of the stepladder are locked in place. Ask someone to hold the stepladder  while you use it. Know where your pets are when moving through your home. What can I do in the bathroom?   Keep the floor dry. Clean up any water on the floor right away. Remove soap buildup in the tub or shower. Use nonskid mats or decals on the floor of the tub or shower. Attach bath mats securely with double-sided, nonslip rug tape. If you need to sit down in the shower, use a plastic, nonslip stool. Install grab bars by the toilet and in the tub and shower. Do not use towel bars as grab bars. What can I do in the bedroom? Make sure that you have a light by your bed that is easy to reach. Do not use any sheets or blankets for your bed that hang to the floor. Have a firm  chair with side arms that you can use for support when you get dressed. What can I do in the kitchen? Clean up any spills right away. If you need to reach something above you, use a step stool with a grab bar. Keep electrical cords out of the way. Do not use floor polish or wax that makes floors slippery. What can I do with my stairs? Do not leave any items on the stairs. Make sure that you have a light switch at the top and the bottom of the stairs. Make sure that there are handrails on both sides of the stairs. Fix handrails that are broken or loose. Install nonslip stair treads on all your stairs. Avoid having throw rugs at the top or bottom of the stairs. Choose a carpet that does not hide the edge of the steps on the stairs. Check carpeting to make sure that it is firmly attached to the stairs. Fix carpet that is loose or worn. What can I do on the outside of my home? Use bright outdoor lighting. Fix the edges of walkways and driveways and fix any cracks. Remove anything that might make you trip as you walk through a door, such as a raised step or threshold. Trim any bushes or trees on paths to your home. Check to see if handrails are loose or broken and that both sides of all steps have handrails. Install guardrails along the edges of any raised decks and porches. Clear paths of anything that can make you trip, such as tools or rocks. Have leaves, snow, or ice cleared regularly. Use sand or salt on paths during winter. Clean up any spills in your garage right away. This includes grease or oil spills. What other actions can I take? Wear shoes that: Have a low heel. Do not wear high heels. Have rubber bottoms. Feel good on your feet and fit well. Are closed at the toe. Do not wear open-toe sandals. Use tools that help you move around if needed. These include: Canes. Walkers. Scooters. Crutches. Review your medicines with your doctor. Some medicines can make you feel dizzy.  This can increase your chance of falling. Ask your doctor what else you can do to help prevent falls. Where to find more information Centers for Disease Control and Prevention, [place]: http://www.wolf.info/ National Institute on Aging: http://kim-miller.com/ Contact a doctor if: You are afraid of falling at home. You feel weak, drowsy, or dizzy at home. You fall at home. Summary There are many simple things that you can do to make your home safe and to help prevent falls. Ways to make your home safe include removing things that can make you trip and installing  grab bars in the bathroom. Ask for help when making these changes in your home. This information is not intended to replace advice given to you by your health care provider. Make sure you discuss any questions you have with your health care provider. Document Revised: 02/23/2020 Document Reviewed: 02/23/2020 Elsevier Patient Education  2022 [employer].   Our clinic's number is [phone]. Please call with questions or concerns about what we discussed today.

## 2021-10-09 NOTE — Telephone Encounter (Signed)
1 tab daily. ? ?Thank you! ? ? ?

## 2021-10-09 NOTE — Telephone Encounter (Signed)
Returned phone call to pharmacy and provided with updated directions per Dr. Chauncey Reading.  ? ?Talbot Grumbling, RN ? ?

## 2021-10-09 NOTE — Telephone Encounter (Signed)
Received phone call from pharmacy regarding clarification needed on meloxicam prescription. Rx currently has two sets of directions: Take 1 tablet (15 mg total) by mouth daily. Take 1/2 tab every other day ? ?Please advise how patient is to be taking medication.  ? ?Talbot Grumbling, RN ? ?

## 2021-11-07 ENCOUNTER — Other Ambulatory Visit: Payer: Self-pay | Admitting: Family Medicine

## 2021-11-07 DIAGNOSIS — M19041 Primary osteoarthritis, right hand: Secondary | ICD-10-CM

## 2021-11-08 ENCOUNTER — Encounter: Payer: Self-pay | Admitting: Family Medicine

## 2021-11-08 ENCOUNTER — Other Ambulatory Visit: Payer: Self-pay | Admitting: Family Medicine

## 2021-11-08 DIAGNOSIS — E042 Nontoxic multinodular goiter: Secondary | ICD-10-CM

## 2021-11-08 NOTE — Progress Notes (Signed)
Future order for thyroid US placed.  ? ?Gladys Damme, MD ?[facility], PGY-3 ? ?

## 2021-11-12 ENCOUNTER — Other Ambulatory Visit: Payer: Self-pay | Admitting: Family Medicine

## 2021-11-12 MED ORDER — TRIAMCINOLONE ACETONIDE 0.1 % EX CREA: 1.0000 "application " | TOPICAL_CREAM | Freq: Two times a day (BID) | CUTANEOUS | 0 refills | Status: AC

## 2021-11-12 NOTE — Progress Notes (Signed)
Triamcinolone refilled per patient's request. ? ?Gladys Damme, MD ?[facility], PGY-3 ? ?

## 2022-01-08 ENCOUNTER — Encounter: Payer: Self-pay | Admitting: *Deleted

## 2022-01-14 ENCOUNTER — Other Ambulatory Visit: Payer: Self-pay | Admitting: Family Medicine

## 2022-01-14 DIAGNOSIS — I10 Essential (primary) hypertension: Secondary | ICD-10-CM

## 2022-02-07 DIAGNOSIS — H4910 Fourth [trochlear] nerve palsy, unspecified eye: Secondary | ICD-10-CM | POA: Insufficient documentation

## 2022-02-07 NOTE — Progress Notes (Cosign Needed Addendum)
SUBJECTIVE:   CHIEF COMPLAINT / HPI:   Hypertension: BP today is slightly above goal at 134/90. She reports she always has slightly elevated BP in the office, suspect white coat syndrome. She has a BP cuff at home and reports that her BP is <130/80 at home, but she has not checked it in a while. Current medication is telmisartan-HCTZ. Will check metabolic panel today for electrolytes and renal function.  Hyperlipidemia: We will check CMP and lipid panel today last time checked was 2021.  Patient is very consistent and firm in declining statins as she believes this was the cause of her ruptured appendicitis in 2018.  I have reviewed the literature extensively, there is no known association between statins and appendicitis.  Patient had atherosclerosis noticed on chest CT in 2021.  She had been referred to cardiology in October 2021 for evaluation at her request to help answer her question about her ASCVD risk. She reports that there was a hold up on her end with COVID that prevented her from establishing care with cardiology at that time. She would like to be referred to cardiology again. 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 At our last visit we also discussed that ASA is not indicated for primary prevention of ASCVD.  Patient also recently read the report from the [place] that states that older people on aspirin for primary prevention are more likely to have anemia.  She agrees and would like to stop ASA.  Thyroid nodule: Patient had thyroid ultrasound in 03/2020, recommended recheck in 1 year. Left nodule was biopsied and Bethesda II category. I discussed this with patient at her last visit with me in October 2021.  Patient did not have thyroid ultrasound.  I reach out to the patient in April 2023 and offered  to place an order and have her scheduled for thyroid ultrasound at that time. Patient responded that she had not had the repeat ultrasound done, she prefers not to as going through that process was distressing to her.  Shingles vaccine question: In August 2021 patient had had her first shingles vaccine.  She had subsequent side effects including chest pain, cough, myalgia.  She came to our clinic and was seen by a colleague who obtained a chest x-ray which showed new pulmonary nodules.  Patient had a remote history of smoking, this resulted in work-up for the new nodules.  She had a bronchoscopy with pulmonology that showed the nodules were not malignant, but inflammatory.  They subsequently were resolved on imaging thereafter.  This was a very distressing event to the patient as the nodules prior to biopsy were highly suspicious for malignancy.  Patient has been searching for a unifying explanation for the nodules.  She is concerned that they were a side effect of the shingles vaccine.  She is also concerned that she will not have good protection in case she gets shingles.  She reports that she had chickenpox when she was in the first grade.  She is curious as to what her protection is with only 1 vaccine.  She requested look into the literature again to see if her her pulmonary nodules could be explained by the shingles vaccine.  PERTINENT  PMH / PSH: hypertension, hyperlipidemia, osteoarthritis  OBJECTIVE:   BP 134/90   Pulse  85   Ht '5\' 2"'$  (1.575 m)   Wt 138 lb 9.6 oz (62.9 kg)   SpO2 98%   BMI 25.35 kg/m   Nursing note and vitals reviewed GEN: Age-appropriate, WW, resting comfortably in chair, NAD, WNWD HEENT: NCAT. PERRLA. Sclera without injection or icterus. MMM.  Cardiac: Regular rate and rhythm. Normal S1/S2. No murmurs, rubs, or gallops appreciated. 2+ radial pulses. Lungs: Clear bilaterally to ascultation. No increased WOB, no accessory muscle usage. No w/r/r. Neuro: AOx3  Ext: no  edema Psych: Pleasant and appropriate  ASSESSMENT/PLAN:   Atherosclerosis of aorta (HCC) Will check lipid panel and CMP today.  Patient has atherosclerosis on previous CT.  She is not interested in statins as previously documented.  She may be a candidate for bempedoic acid.  Patient would like to be referred to cardiology to discuss ASCVD further. -Referral to cardiology placed  HTN (hypertension) Chronic, appears controlled.  We will check metabolic panel today.  Recommend patient check her blood pressure at home for the next 7 days, can message with results.  Continue current management if home blood pressures are less than 135/85 on average.  Multiple thyroid nodules Patient prefers not to have repeat ultrasound done.  Since right nodule did not meet criteria for surveillance since that left nodule was Bethesda 2 category, this is a reasonable choice.  Healthcare maintenance I reviewed the literature for Shingrix vaccine on CDC website, the FDA package insert, and on ACIP.  In the 5 RCTs that led to the approval of Shingrix, none of the reported adverse events listed pulmonary nodules.  I gave patient the link to the package insert as well as ACIP.  Discussed with Dr. Valentina Lucks our pharmacist about the protection of a single dose of Shingrix.  This will give her certainly above 81% protection, likely 70 to 80% protection.  The second dose does get people over 90% protection.  I gave this information to the patient via [place].  Concerns for side effects, and that side effects are common with Shingrix, it is a reasonable choice for her to not get the second dose.     Gladys Damme, MD [place]

## 2022-02-08 ENCOUNTER — Ambulatory Visit (INDEPENDENT_AMBULATORY_CARE_PROVIDER_SITE_OTHER): Payer: Medicare Other | Admitting: Family Medicine

## 2022-02-08 ENCOUNTER — Encounter: Payer: Self-pay | Admitting: Family Medicine

## 2022-02-08 VITALS — BP 134/90 | HR 85 | Ht 62.0 in | Wt 138.6 lb

## 2022-02-08 DIAGNOSIS — H4910 Fourth [trochlear] nerve palsy, unspecified eye: Secondary | ICD-10-CM | POA: Diagnosis not present

## 2022-02-08 DIAGNOSIS — E782 Mixed hyperlipidemia: Secondary | ICD-10-CM | POA: Diagnosis not present

## 2022-02-08 DIAGNOSIS — Z0000 Encounter for general adult medical examination without abnormal findings: Secondary | ICD-10-CM | POA: Diagnosis not present

## 2022-02-08 DIAGNOSIS — I700 Atherosclerosis of aorta: Secondary | ICD-10-CM

## 2022-02-08 DIAGNOSIS — E042 Nontoxic multinodular goiter: Secondary | ICD-10-CM

## 2022-02-08 DIAGNOSIS — I10 Essential (primary) hypertension: Secondary | ICD-10-CM

## 2022-02-08 NOTE — Patient Instructions (Addendum)
It was a pleasure to see you today!  We will get some labs today.  If they are abnormal or we need to do something about them, I will call you.  If they are normal, I will send you a message on MyChart (if it is active) or a letter in the mail.  If you don't hear from [place] in 2 weeks, please call the office  [phone]. I recommend just checking your blood pressure at home, as long as it is under 130/80, you can continue your current medication telmisartan-HCTZ. I have placed a referral for cardiology. You should receive a phone call in 1-2 weeks to schedule this appointment. If for any reason you do not receive a phone call or need more help scheduling this appointment, please call our office at [phone]. Regarding the shingles vaccine, places to look for information: CDC vaccine page for shingrix, also ACIP (Advisory Committee on [employer]), sometimes the FDA will have updates, as well as NIH   Be Well,  Dr. Chauncey Reading

## 2022-02-08 NOTE — Assessment & Plan Note (Signed)
I reviewed the literature for Shingrix vaccine on CDC website, the FDA package insert, and on ACIP.  In the 5 RCTs that led to the approval of Shingrix, none of the reported adverse events listed pulmonary nodules.  I gave patient the link to the package insert as well as ACIP.  Discussed with Dr. Valentina Lucks our pharmacist about the protection of a single dose of Shingrix.  This will give her certainly above 22% protection, likely 70 to 80% protection.  The second dose does get people over 90% protection.  I gave this information to the patient via [place].  Concerns for side effects, and that side effects are common with Shingrix, it is a reasonable choice for her to not get the second dose.

## 2022-02-08 NOTE — Assessment & Plan Note (Signed)
Chronic, appears controlled.  We will check metabolic panel today.  Recommend patient check her blood pressure at home for the next 7 days, can message with results.  Continue current management if home blood pressures are less than 135/85 on average.

## 2022-02-08 NOTE — Assessment & Plan Note (Signed)
Will check lipid panel and CMP today.  Patient has atherosclerosis on previous CT.  She is not interested in statins as previously documented.  She may be a candidate for bempedoic acid.  Patient would like to be referred to cardiology to discuss ASCVD further. -Referral to cardiology placed

## 2022-02-08 NOTE — Assessment & Plan Note (Signed)
Patient prefers not to have repeat ultrasound done.  Since right nodule did not meet criteria for surveillance since that left nodule was Bethesda 2 category, this is a reasonable choice.

## 2022-02-09 ENCOUNTER — Encounter: Payer: Self-pay | Admitting: Family Medicine

## 2022-02-09 DIAGNOSIS — I10 Essential (primary) hypertension: Secondary | ICD-10-CM

## 2022-02-09 LAB — COMPREHENSIVE METABOLIC PANEL
ALT: 12 IU/L (ref 0–32)
AST: 23 IU/L (ref 0–40)
Albumin/Globulin Ratio: 2 (ref 1.2–2.2)
Albumin: 4.6 g/dL (ref 3.7–4.7)
Alkaline Phosphatase: 72 IU/L (ref 44–121)
BUN/Creatinine Ratio: 11 — ABNORMAL LOW (ref 12–28)
BUN: 11 mg/dL (ref 8–27)
Bilirubin Total: 0.5 mg/dL (ref 0.0–1.2)
CO2: 25 mmol/L (ref 20–29)
Calcium: 10.1 mg/dL (ref 8.7–10.3)
Chloride: 100 mmol/L (ref 96–106)
Creatinine, Ser: 1 mg/dL (ref 0.57–1.00)
Globulin, Total: 2.3 g/dL (ref 1.5–4.5)
Glucose: 104 mg/dL — ABNORMAL HIGH (ref 70–99)
Potassium: 3.9 mmol/L (ref 3.5–5.2)
Sodium: 141 mmol/L (ref 134–144)
Total Protein: 6.9 g/dL (ref 6.0–8.5)
eGFR: 57 mL/min/{1.73_m2} — ABNORMAL LOW (ref >59)

## 2022-02-09 LAB — LIPID PANEL
Chol/HDL Ratio: 2.7 ratio (ref 0.0–4.4)
Cholesterol, Total: 189 mg/dL (ref 100–199)
HDL: 70 mg/dL (ref >39)
LDL Chol Calc (NIH): 100 mg/dL — ABNORMAL HIGH (ref 0–99)
Triglycerides: 110 mg/dL (ref 0–149)
VLDL Cholesterol Cal: 19 mg/dL (ref 5–40)

## 2022-02-11 ENCOUNTER — Encounter: Payer: Self-pay | Admitting: Family Medicine

## 2022-02-11 MED ORDER — TELMISARTAN-HCTZ 80-25 MG PO TABS: 1.0000 | ORAL_TABLET | Freq: Every day | ORAL | 0 refills | Status: DC

## 2022-03-05 ENCOUNTER — Ambulatory Visit (INDEPENDENT_AMBULATORY_CARE_PROVIDER_SITE_OTHER): Payer: Medicare Other | Admitting: Internal Medicine

## 2022-03-05 ENCOUNTER — Other Ambulatory Visit: Payer: Self-pay

## 2022-03-05 ENCOUNTER — Encounter: Payer: Self-pay | Admitting: Internal Medicine

## 2022-03-05 VITALS — BP 122/76 | HR 85 | Ht 62.0 in | Wt 138.8 lb

## 2022-03-05 DIAGNOSIS — E782 Mixed hyperlipidemia: Secondary | ICD-10-CM

## 2022-03-05 DIAGNOSIS — Z79899 Other long term (current) drug therapy: Secondary | ICD-10-CM

## 2022-03-05 MED ORDER — NEXLETOL 180 MG PO TABS: 180.0000 mg | ORAL_TABLET | Freq: Every day | ORAL | 3 refills | Status: DC

## 2022-03-05 NOTE — Patient Instructions (Addendum)
Medication Instructions: START ASPIRIN 3 MG DAILY   NEXLETOL..WILL TALK WITH THE PHARMACIST ABOUT COST    *If you need a refill on your cardiac medications before your next appointment, please call your pharmacy*   Lab Work: NMR, APO B, LIPO A , HEPATIC IN 8 WEEKS  If you have labs (blood work) drawn today and your tests are completely normal, you will receive your results only by: [place] (if you have MyChart) OR A paper copy in the mail If you have any lab test that is abnormal or we need to change your treatment, we will call you to review the results.   Testing/Procedures:    Follow-Up: At [facility], you and your health needs are our priority.  As part of our continuing mission to provide you with exceptional heart care, we have created designated Provider Care Teams.  These Care Teams include your primary Cardiologist (physician) and Advanced Practice Providers (APPs -  Physician Assistants and Nurse Practitioners) who all work together to provide you with the care you need, when you need it.  We recommend signing up for the patient portal called "MyChart".  Sign up information is provided on this After Visit Summary.  MyChart is used to connect with patients for Virtual Visits (Telemedicine).  Patients are able to view lab/test results, encounter notes, upcoming appointments, etc.  Non-urgent messages can be sent to your provider as well.   To learn more about what you can do with MyChart, go to [email or web address].    Your next appointment:   6 month(s)  The format for your next appointment:   In Person  Provider:   DR Dorris Carnes   If primary card or EP is not listed click here to update    :1}    Other [place] at [facility] / Find a Financial controller Near You / Therapist, music at [place]: 7 a.m. - 5 p.m. (Mon & Wed); 7 a.m. - 6 p.m. (Tue & Thurs); 7 a.m. - 4:30 p.m. (Fri)  [place]: 8 a.m. - 5 p.m. (Mon-Thurs); 8 a.m. - 3 p.m. (Friday); Virtual by request  Contact [place], [place]: [phone] Behavioral Medicine: [phone] Fax: [phone]    Important Information About Sugar

## 2022-03-05 NOTE — Progress Notes (Signed)
Cardiology Office Note   Date:  03/05/2022   ID:  Jessica Beck, DOB Dec 25, 1940, MRN [id number]  PCP:  Salvadore Oxford, MD  Cardiologist:   Dorris Carnes, MD   Patient presents for follow up of HTN and CAD      History of Present Illness: Jessica Beck is a 81 y.o. female with a history of HTN   Followed in IM   CT in 2021 showed coronary calcifications   Lipids this year LDL 100  HDL 70    Trig 110     Since seen the patient denies CP   Walks daily with dog Brisk then slow Walks 1 to 2 miles per day    She says her breathing is good      Tried statin in past    Had problems with joints.  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 Two per day   Cholecalciferol 25 MCG (1000 UT) tablet Take 1,000 Units by mouth daily.    diphenhydrAMINE HCl (BENADRYL DYE-FREE ALLERGY PO) Take by mouth at bedtime.   fluticasone (FLONASE) 50 MCG/ACT nasal spray Place 2 sprays into both nostrils daily as needed for allergies.   loperamide (IMODIUM) 2 MG capsule Take 1 mg by mouth as needed for diarrhea or loose stools (IBS-D). Takes 1/2 of 2 mg   magnesium oxide (MAG-OX) 400 MG tablet Take 400 mg by mouth every evening.    meloxicam (MOBIC) 15 MG tablet Take 1 tablet (15 mg total) by mouth daily. Take 1/2 tab every other day (Patient taking differently: Take 15 mg by mouth daily. Take 1/2 tab every other day  as needed)   Omega-3 Fatty Acids (FISH OIL) 1000 MG CAPS Take 1 capsule by mouth daily.   Probiotic Product (ALIGN PO) Take 1 capsule by mouth daily.   telmisartan-hydrochlorothiazide (MICARDIS HCT) 80-25 MG tablet Take 1 tablet by mouth daily.   triamcinolone cream (KENALOG) 0.1 % Apply 1 application. topically 2 (two) times daily.     Allergies:   Other, Oxycodone hcl,  Flexeril [cyclobenzaprine], Latex, Sulfa antibiotics, and Statins   Past Medical History:  Diagnosis Date   Anxiety    situ   Arthritis    osteo    Asthma    mild   Complication of anesthesia    general - long time to wake up.  Takes a longer than  usual time to wake up after colonoscopy.   Gall bladder stones    present   GERD (gastroesophageal reflux disease)    Hypertension    IBS (irritable bowel syndrome)    with diarrhea   Migraine 03/07/2017   03/20/20- "mild"   Multiple thyroid nodules     Past Surgical History:  Procedure Laterality Date   BRONCHIAL BIOPSY  03/21/2020   Procedure: BRONCHIAL BIOPSIES;  Surgeon: Garner Nash, DO;  Location: [facility];  Service: Pulmonary;;   BRONCHIAL BRUSHINGS  03/21/2020   Procedure: BRONCHIAL BRUSHINGS;  Surgeon: Garner Nash, DO;  Location: [facility];  Service: Pulmonary;;   BRONCHIAL NEEDLE ASPIRATION BIOPSY  03/21/2020   Procedure: BRONCHIAL NEEDLE ASPIRATION BIOPSIES;  Surgeon: Garner Nash, DO;  Location: [facility];  Service: Pulmonary;;   BRONCHIAL WASHINGS  03/21/2020   Procedure: BRONCHIAL WASHINGS;  Surgeon: Garner Nash, DO;  Location: [facility];  Service: Pulmonary;;   HIP RESECTION ARTHROPLASTY Right    KNEE ARTHROPLASTY Right    KNEE ARTHROPLASTY Left    LAPAROSCOPIC APPENDECTOMY N/A 09/14/2016   Procedure: APPENDECTOMY LAPAROSCOPIC;  Surgeon: Armandina Gemma, MD;  [facility];  Service: General;  Laterality: N/A;   VIDEO BRONCHOSCOPY WITH ENDOBRONCHIAL NAVIGATION Right 03/21/2020   Procedure: VIDEO BRONCHOSCOPY WITH ENDOBRONCHIAL NAVIGATION;  Surgeon: Garner Nash, DO;  Location: [facility];  Service: Pulmonary;  Laterality: Right;     Social History:  The patient  reports that she quit smoking about 47 years ago. Her smoking use included cigarettes. She has been exposed to tobacco smoke. She has never used smokeless tobacco. She reports current alcohol use of about 5.0 standard drinks of  alcohol per week. She reports that she does not use drugs.   Family History:  The patient's family history includes COPD in her father; Cancer in her sister; Hypertension in her mother; Stroke in her mother.    ROS:  Please see the history of present illness. All other systems are reviewed and  Negative to the above problem except as noted.    PHYSICAL EXAM: VS:  BP 122/76   Pulse 85   Ht '5\' 2"'$  (1.575 m)   Wt 138 lb 12.8 oz (63 kg)   SpO2 97%   BMI 25.39 kg/m   GEN: Well nourished, well developed, in no acute distress  HEENT: normal  Neck: no JVD, carotid bruits Cardiac: RRR; no murmurs,  No LE edema  Respiratory:  clear to auscultation bilaterally,  GI: soft, nontender, nondistended, + BS  No hepatomegaly  MS: no deformity Moving all extremities   Skin: warm and dry, no rash Neuro:  Strength and sensation are intact Psych: euthymic mood, full affect   EKG:  EKG is ordered today.  SR   85 bpm    Lipid Panel    Component Value Date/Time   CHOL 189 02/08/2022 1035   TRIG 110 02/08/2022 1035   HDL 70 02/08/2022 1035   CHOLHDL 2.7 02/08/2022 1035   CHOLHDL 2.4 10/25/2017 0418   VLDL 12 10/25/2017 0418   LDLCALC 100 (H) 02/08/2022 1035      Wt Readings from Last 3 Encounters:  03/05/22 138 lb 12.8 oz (63 kg)  02/08/22 138 lb 9.6 oz (62.9 kg)  10/02/21 152 lb (68.9 kg)      ASSESSMENT AND PLAN:  1  CAD  Pt with extensive coronary artery calcifications /atherosclerosis   She is asymtpomatic        REcomm   ecASA 81 mg    Would get on lipid lowering reimgn Stay active Watch diet  2   Lipids   LDL 100  in 2022  Did not tolerate statins in past     Does not want an injection    Would try Nexletol to start  Checlk lipomed, Lpa and ApoB in 8 to 10 wks   3  HTN  BP is OK  Keep on same regimen    4   Diet   Whole, natural food   Limit sugars        F/U in 6 to 8 months   Current medicines are reviewed at length with the patient today.  The patient does not have  concerns regarding medicines.  Signed, Dorris Carnes, MD  03/05/2022 9:13 AM    [facility]  [address], [place], [place]  [address] Phone: [phone]; Fax: [phone]

## 2022-03-06 ENCOUNTER — Telehealth: Payer: Self-pay

## 2022-03-06 NOTE — Telephone Encounter (Signed)
-----   Message from Stephani Police, RN sent at 03/05/2022  2:42 PM EDT ----- Regarding: nexletol Hi... Dr Harrington Challenger started the pt on Nexletol and she is going to the Pharmacy today to see how much with her insurance if affordable... but it was coming up non-formulary... I told her I will call her to follow up tomorrow after she goes to the pharmacy... is there any thing else that I can do?   Lelon Frohlich

## 2022-03-06 NOTE — Telephone Encounter (Signed)
I spoke with the pt and [facility] has already sent the request Mayer Masker PA for her Nexletol... pt aware we will reach back out to her and her pharmacy once we hear back form her insurance.   I will forward to our prior auth nurse, Jeani Hawking... pt says she tried Statins in the past but she developed such severe body aches that it masked appendicitis symptoms per her and her appendix had ruptured... she is also unwilling to use anything injectable.

## 2022-03-06 NOTE — Telephone Encounter (Signed)
**Note De-Identified Alani Lacivita Obfuscation** Nexletol PA started through covermymeds. Key: [id number]

## 2022-03-08 NOTE — Telephone Encounter (Signed)
**Note De-Identified Jessica Beck Obfuscation** [facility] Key: [id number] - PA Case ID: [id number] - Rx #: [id number] Outcome: Approved on August 3 Prior Auth;Coverage Start Date:02/04/2022;Coverage End Date:08/04/2098; Drug Nexletol '180MG'$  tablets Form [employer] Electronic PA Form (2017 NCPDP) Original Claim Info [id number]  I have notified the pt and [place], [place] (Ph: [phone]) of this approval.

## 2022-03-11 ENCOUNTER — Telehealth: Payer: Self-pay

## 2022-03-11 NOTE — Telephone Encounter (Signed)
-----   Message from Ramond Dial, [place] sent at 03/08/2022 12:23 PM EDT ----- Regarding: RE: nexletol Looks like lyn got this PA approved. [place] re-opened so if pt says it took expensive we may be able to get her help ----- Message ----- From: Stephani Police, RN Sent: 03/05/2022   2:46 PM EDT To: Stephani Police, RN; Cv Div Pharmd Subject: nexletol                                       Hi... Dr Harrington Challenger started the pt on Nexletol and she is going to the Pharmacy today to see how much with her insurance if affordable... but it was coming up non-formulary... I told her I will call her to follow up tomorrow after she goes to the pharmacy... is there any thing else that I can do?   Lelon Frohlich

## 2022-03-11 NOTE — Telephone Encounter (Signed)
I spoke with the pt and her Rx will cost her about 68.00 but she will reach back out if it becomes unaffordable for her.

## 2022-03-11 NOTE — Telephone Encounter (Signed)
Left a message for the pt to call back.  

## 2022-03-28 ENCOUNTER — Telehealth: Payer: Self-pay

## 2022-03-28 DIAGNOSIS — M159 Polyosteoarthritis, unspecified: Secondary | ICD-10-CM

## 2022-03-29 NOTE — Telephone Encounter (Deleted)
Outpatient pharmacy calls nurse line in regards to Pylera prescription.   Pharmacy reports their Pylera comes in pre packaged dosage packs.   #120 taking 3 tablets 4x per day for 10 days.   Will forward to PCP.   If appropriate I can give verbal to edit prescription per pharmacy.

## 2022-03-29 NOTE — Telephone Encounter (Signed)
Disregard previous note- wrong medication for patient.    Patient is requesting a 90 day supply of Tramadol if appropriate.

## 2022-04-02 NOTE — Telephone Encounter (Signed)
Patient returns call to nurse line. She is requesting refill on Tramadol. She states that she has two pills left and that she takes this medication for her arthritis.   Please send to [facility].   Talbot Grumbling, RN

## 2022-04-03 MED ORDER — TRAMADOL HCL 50 MG PO TABS: 50.0000 mg | ORAL_TABLET | Freq: Two times a day (BID) | ORAL | 1 refills | Status: AC | PRN

## 2022-04-03 NOTE — Telephone Encounter (Signed)
Called patient regarding refill of tramadol prescription. Patient very upset that her prescription has not been refilled early than now. Explained to patient that was I unaware of a need for a refill until now. Phone called dropped before being able to discuss with patient that I refilled for 60 days and would be unable to prescribe again without a visit. Attempted to call back x2 but unable to reach. Messaged in myChart to make appointment prior to next refill.

## 2022-04-03 NOTE — Addendum Note (Signed)
Addended by: Salvadore Oxford on: 04/03/2022 05:03 PM   Modules accepted: Orders

## 2022-04-04 ENCOUNTER — Encounter: Payer: Self-pay | Admitting: Family Medicine

## 2022-04-04 NOTE — Progress Notes (Signed)
Sent letter regarding tramadol refill to patient, in addition to [employer] in previous telephone encounter.

## 2022-04-22 ENCOUNTER — Encounter: Payer: Self-pay | Admitting: Internal Medicine

## 2022-04-29 ENCOUNTER — Other Ambulatory Visit: Payer: Self-pay

## 2022-04-29 ENCOUNTER — Encounter: Payer: Self-pay | Admitting: Internal Medicine

## 2022-04-29 ENCOUNTER — Telehealth: Payer: Self-pay | Admitting: Internal Medicine

## 2022-04-29 MED ORDER — NEXLETOL 180 MG PO TABS: 180.0000 mg | ORAL_TABLET | Freq: Every day | ORAL | 3 refills | Status: DC

## 2022-04-29 NOTE — Telephone Encounter (Signed)
**Note De-Identified Jessica Beck Obfuscation** I started a Nexletol PA through covermymeds and received this message:  Drug: Nexletol '180MG'$  tablets Form: [employer] Electronic PA Form (2017 NCPDP)  Information regarding your request An active PA is already on file with expiration date of 08/04/2098. Please wait to resubmit request within 60 days of that expiration date to obtain a PA renewal.  I have answered the pts [facility] message concerning this and I am forwarding to refills so they can e-scribe the ps Nexletol to [place] as requested.

## 2022-04-29 NOTE — Telephone Encounter (Addendum)
Attempted to fax RX  to [employer] [phone] several times but unable to go through.. Will keep trying.

## 2022-04-29 NOTE — Telephone Encounter (Signed)
Pt c/o medication issue:  1. Name of Medication:   Bempedoic Acid (NEXLETOL) 180 MG TABS    2. How are you currently taking this medication (dosage and times per day)? Take 180 mg by mouth daily. - Oral  3. Are you having a reaction (difficulty breathing--STAT)? no  4. What is your medication issue? Pt asking to speak with Lelon Frohlich about this medication

## 2022-04-29 NOTE — Telephone Encounter (Signed)
Pt called to report that she needs a new prior for her Nexletol since [employer] says she cannot get her RX from [facility]...   She would have to get it from [facility] directly.   We have to call [employer] at [phone] and after approved fax RX to [phone] and ask to expedite. Must be for 90 days with 3 refills.

## 2022-04-30 ENCOUNTER — Ambulatory Visit: Payer: Medicare Other | Attending: Internal Medicine

## 2022-04-30 ENCOUNTER — Other Ambulatory Visit: Payer: Self-pay | Admitting: Pharmacist Clinician (PhC)/ Clinical Pharmacy Specialist

## 2022-04-30 DIAGNOSIS — Z79899 Other long term (current) drug therapy: Secondary | ICD-10-CM | POA: Insufficient documentation

## 2022-04-30 DIAGNOSIS — E782 Mixed hyperlipidemia: Secondary | ICD-10-CM | POA: Insufficient documentation

## 2022-04-30 MED ORDER — NEXLETOL 180 MG PO TABS: 180.0000 mg | ORAL_TABLET | Freq: Every day | ORAL | 3 refills | Status: DC

## 2022-04-30 NOTE — Telephone Encounter (Signed)
Your welcome, thanks for all that you do.

## 2022-04-30 NOTE — Telephone Encounter (Addendum)
Called pt to inform her that we were leaving her samples of bempedoic acid (Nexletol) at [facility] office front desk for pt to pick up and that I faxed a Rx for the same medication to [place] at [phone], confirmation received. I advised the pt that if she has any other problems, questions or concerns, to give our office a call back. Pt verbalized understanding. FYI

## 2022-04-30 NOTE — Telephone Encounter (Signed)
Prescription samples were left at front desk of [facility] office.

## 2022-05-01 LAB — HEPATIC FUNCTION PANEL
ALT: 13 IU/L (ref 0–32)
AST: 26 IU/L (ref 0–40)
Albumin: 4.9 g/dL — ABNORMAL HIGH (ref 3.8–4.8)
Alkaline Phosphatase: 63 IU/L (ref 44–121)
Bilirubin Total: 0.5 mg/dL (ref 0.0–1.2)
Bilirubin, Direct: 0.19 mg/dL (ref 0.00–0.40)
Total Protein: 7.2 g/dL (ref 6.0–8.5)

## 2022-05-01 LAB — LIPOPROTEIN A (LPA): Lipoprotein (a): 58 nmol/L (ref <75.0)

## 2022-05-01 LAB — NMR, LIPOPROFILE
Cholesterol, Total: 165 mg/dL (ref 100–199)
HDL Particle Number: 45.4 umol/L (ref >=30.5)
HDL-C: 67 mg/dL (ref >39)
LDL Particle Number: 651 nmol/L (ref <1000)
LDL Size: 21 nm (ref >20.5)
LDL-C (NIH Calc): 78 mg/dL (ref 0–99)
LP-IR Score: 49 — ABNORMAL HIGH (ref <=45)
Small LDL Particle Number: 390 nmol/L (ref <=527)
Triglycerides: 114 mg/dL (ref 0–149)

## 2022-05-01 LAB — APOLIPOPROTEIN B: Apolipoprotein B: 63 mg/dL (ref <90)

## 2022-05-03 ENCOUNTER — Other Ambulatory Visit: Payer: Self-pay | Admitting: Family Medicine

## 2022-05-03 DIAGNOSIS — I10 Essential (primary) hypertension: Secondary | ICD-10-CM

## 2022-05-09 ENCOUNTER — Encounter: Payer: Self-pay | Admitting: Internal Medicine

## 2022-05-14 ENCOUNTER — Other Ambulatory Visit (HOSPITAL_BASED_OUTPATIENT_CLINIC_OR_DEPARTMENT_OTHER): Payer: Self-pay

## 2022-05-23 ENCOUNTER — Other Ambulatory Visit: Payer: Self-pay

## 2022-05-23 ENCOUNTER — Telehealth: Payer: Self-pay

## 2022-05-23 MED ORDER — NEXLETOL 180 MG PO TABS: 180.0000 mg | ORAL_TABLET | Freq: Every day | ORAL | 3 refills | Status: DC

## 2022-05-23 NOTE — Telephone Encounter (Signed)
**Note De-Identified Orazio Weller Obfuscation** The pt states that she has not received her Nexletol from [facility] and is almost out.  I advised her that we have printed and faxed a Nexletol 180 mg RX to the fax number that she provided [place] with and that we can not find a home delivery pharmacy for [place].  She does not have a ins. card or a phone number to reach [place] at.  I advised her that I would look into it.  I looked online and found:  [employer]  Phone Number [phone]  I then called [phone] and was transferred to the [facility] delivery pharmacy at [phone]. I was advised by Mariana Single that a 30 or 90 day supply of Nexletol will cost the pt $68.  As advised by Mariana Single, I e-scribed the pts Nexletol 180 mg #90 with 3 refills to   [facility], [place] (Ph: [phone]).  The pt is aware of all the above and I advised her to call [facility] at [phone] to get a better understanding of how they refills her medications as they may require that she contact them when she is down to 2 weeks of Nexletol to request a refill as they may not send out automatically. She is aware that she has 3 refills on the RX as well.  She is aware that we are leaving her 2 weeks of Nexletol 180 mg samples in the front office at our [facility] for her to pick up. She thanked me for all of our assistance.

## 2022-06-10 DIAGNOSIS — H259 Unspecified age-related cataract: Secondary | ICD-10-CM | POA: Diagnosis not present

## 2022-06-10 DIAGNOSIS — Z23 Encounter for immunization: Secondary | ICD-10-CM | POA: Diagnosis not present

## 2022-06-10 DIAGNOSIS — H9190 Unspecified hearing loss, unspecified ear: Secondary | ICD-10-CM | POA: Diagnosis not present

## 2022-06-10 DIAGNOSIS — I10 Essential (primary) hypertension: Secondary | ICD-10-CM | POA: Diagnosis not present

## 2022-06-10 DIAGNOSIS — M1991 Primary osteoarthritis, unspecified site: Secondary | ICD-10-CM | POA: Diagnosis not present

## 2022-06-10 DIAGNOSIS — E7800 Pure hypercholesterolemia, unspecified: Secondary | ICD-10-CM | POA: Diagnosis not present

## 2022-06-10 DIAGNOSIS — H6121 Impacted cerumen, right ear: Secondary | ICD-10-CM | POA: Diagnosis not present

## 2022-07-12 DIAGNOSIS — H2513 Age-related nuclear cataract, bilateral: Secondary | ICD-10-CM | POA: Diagnosis not present

## 2022-07-12 DIAGNOSIS — H35371 Puckering of macula, right eye: Secondary | ICD-10-CM | POA: Diagnosis not present

## 2022-07-12 DIAGNOSIS — H5213 Myopia, bilateral: Secondary | ICD-10-CM | POA: Diagnosis not present

## 2022-07-25 DIAGNOSIS — H269 Unspecified cataract: Secondary | ICD-10-CM | POA: Diagnosis not present

## 2022-07-25 DIAGNOSIS — H2512 Age-related nuclear cataract, left eye: Secondary | ICD-10-CM | POA: Diagnosis not present

## 2022-08-01 DIAGNOSIS — H2511 Age-related nuclear cataract, right eye: Secondary | ICD-10-CM | POA: Diagnosis not present

## 2022-08-01 DIAGNOSIS — H269 Unspecified cataract: Secondary | ICD-10-CM | POA: Diagnosis not present

## 2022-08-13 ENCOUNTER — Other Ambulatory Visit (HOSPITAL_COMMUNITY): Payer: Self-pay

## 2022-09-12 DIAGNOSIS — H59811 Chorioretinal scars after surgery for detachment, right eye: Secondary | ICD-10-CM | POA: Diagnosis not present

## 2022-09-12 DIAGNOSIS — H43391 Other vitreous opacities, right eye: Secondary | ICD-10-CM | POA: Diagnosis not present

## 2022-09-12 DIAGNOSIS — H43813 Vitreous degeneration, bilateral: Secondary | ICD-10-CM | POA: Diagnosis not present

## 2022-09-19 DIAGNOSIS — Z0000 Encounter for general adult medical examination without abnormal findings: Secondary | ICD-10-CM | POA: Diagnosis not present

## 2022-09-19 DIAGNOSIS — Z6822 Body mass index (BMI) 22.0-22.9, adult: Secondary | ICD-10-CM | POA: Diagnosis not present

## 2022-09-19 DIAGNOSIS — Z23 Encounter for immunization: Secondary | ICD-10-CM | POA: Diagnosis not present

## 2022-09-19 DIAGNOSIS — Z1389 Encounter for screening for other disorder: Secondary | ICD-10-CM | POA: Diagnosis not present

## 2022-09-19 NOTE — Progress Notes (Signed)
Cardiology Office Note   Date:  09/20/2022   ID:  Jessica Beck, DOB 1941/06/07, MRN [id number]  PCP:  Salvadore Oxford, MD  Cardiologist:   Dorris Carnes, MD   Patient presents for follow up of HTN and CAD      History of Present Illness: Jessica Beck is a 82 y.o. female with a history of HTN   Followed in IM   CT in 2021 showed coronary calcifications    I saw the pt in clinic in Aug 2023    Patient has been doing good  No CP   No SOB   Active   Walks [place] daily about 1.5 miles  PT says she has"white coat"   She says her   BP is  better at home   120s/ Occasional heart racing   lasts about 1 min    Used phone appp    HR in 90s   No spells lasting longer     Current Meds  Medication Sig   acetaminophen (TYLENOL) 500 MG tablet Take 650 mg by mouth every 8 (eight) hours as needed for moderate pain. Two per day   aspirin EC 81 MG tablet Take 81 mg by mouth every evening.   Bempedoic Acid (NEXLETOL) 180 MG TABS Take 180 mg by mouth daily.   Cholecalciferol 25 MCG (1000 UT) tablet Take 1,000 Units by mouth daily.    COVID-19 mRNA bivalent vaccine, Pfizer, (PFIZER COVID-19 VAC BIVALENT) injection Inject into the muscle.   COVID-19 mRNA Vac-TriS, Pfizer, (PFIZER-BIONT COVID-19 VAC-TRIS) SUSP injection Inject into the muscle.   diphenhydrAMINE HCl (BENADRYL DYE-FREE ALLERGY PO) Take 25 mg by mouth as needed.   influenza vaccine adjuvanted (FLUAD) 0.5 ML injection Inject into the muscle.   loperamide (IMODIUM) 2 MG capsule Take 1 mg by mouth as needed for diarrhea or loose stools (IBS-D). Takes 1/2 of 2 mg   magnesium oxide (MAG-OX) 400 MG tablet Take 400 mg by mouth every evening.    Omega-3 Fatty Acids (FISH OIL) 1000 MG CAPS Take 1 capsule by mouth daily.   Polyethyl Glycol-Propyl Glycol 0.4-0.3 % SOLN Place 1 drop into both eyes daily as needed (dry eyes).   Probiotic Product (ALIGN PO) Take 1 capsule by mouth daily.   telmisartan-hydrochlorothiazide (MICARDIS HCT) 80-25 MG  tablet Take 1 tablet by mouth daily. MAKE APPT PRIOR TO NEXT REFILL   traMADol (ULTRAM) 50 MG tablet Take 1 tablet (50 mg total) by mouth every 12 (twelve) hours as needed.   triamcinolone cream (KENALOG) 0.1 % Apply 1 application. topically 2 (two) times daily.     Allergies:   Other, Oxycodone hcl, Flexeril [cyclobenzaprine], Latex, Sulfa antibiotics, and Statins   Past Medical History:  Diagnosis Date   Anxiety    situ   Arthritis    osteo    Asthma    mild   Complication of anesthesia    general - long time to wake up.  Takes a longer than  usual time to wake up after colonoscopy.   Gall bladder stones    present   GERD (gastroesophageal reflux disease)    Hypertension    IBS (irritable bowel syndrome)    with diarrhea   Migraine 03/07/2017   03/20/20- "mild"   Multiple thyroid nodules     Past Surgical History:  Procedure Laterality Date   BRONCHIAL BIOPSY  03/21/2020   Procedure: BRONCHIAL BIOPSIES;  Surgeon: Garner Nash, DO;  Location: [place] [facility];  Service: Pulmonary;;  BRONCHIAL BRUSHINGS  03/21/2020   Procedure: BRONCHIAL BRUSHINGS;  Surgeon: Garner Nash, DO;  Location: [place];  Service: Pulmonary;;   BRONCHIAL NEEDLE ASPIRATION BIOPSY  03/21/2020   Procedure: BRONCHIAL NEEDLE ASPIRATION BIOPSIES;  Surgeon: Garner Nash, DO;  Location: [place];  Service: Pulmonary;;   BRONCHIAL WASHINGS  03/21/2020   Procedure: BRONCHIAL WASHINGS;  Surgeon: Garner Nash, DO;  Location: [place];  Service: Pulmonary;;   HIP RESECTION ARTHROPLASTY Right    KNEE ARTHROPLASTY Right    KNEE ARTHROPLASTY Left    LAPAROSCOPIC APPENDECTOMY N/A 09/14/2016   Procedure: APPENDECTOMY LAPAROSCOPIC;  Surgeon: Armandina Gemma, MD;  [facility];  Service: General;  Laterality: N/A;   VIDEO BRONCHOSCOPY WITH ENDOBRONCHIAL NAVIGATION Right 03/21/2020   Procedure: VIDEO BRONCHOSCOPY WITH ENDOBRONCHIAL NAVIGATION;  Surgeon: Garner Nash, DO;  Location: [place];   Service: Pulmonary;  Laterality: Right;     Social History:  The patient  reports that she quit smoking about 48 years ago. Her smoking use included cigarettes. She has been exposed to tobacco smoke. She has never used smokeless tobacco. She reports current alcohol use of about 5.0 standard drinks of alcohol per week. She reports that she does not use drugs.   Family History:  The patient's family history includes COPD in her father; Cancer in her sister; Hypertension in her mother; Stroke in her mother.    ROS:  Please see the history of present illness. All other systems are reviewed and  Negative to the above problem except as noted.    PHYSICAL EXAM: VS:  BP (!) 140/84 (BP Location: Right Arm, Patient Position: Sitting, Cuff Size: Normal)   Pulse 80   Ht 5' 1"$  (1.549 m)   Wt 124 lb 3.2 oz (56.3 kg)   BMI 23.47 kg/m   GEN: Well nourished, well developed, in no acute distress  HEENT: normal  Neck: no JVD, no carotid bruit Cardiac: RRR; no murmur,  No LE edema  Respiratory:  clear to auscultation bilaterally,  GI: soft, nontender, nondistended, + BS  No hepatomegaly  MS: no deformity Moving all extremities   Skin: warm and dry, no rash Neuro:  Strength and sensation are intact Psych: euthymic mood, full affect   EKG:  EKG is not done today    Lipid Panel    Component Value Date/Time   CHOL 189 02/08/2022 1035   TRIG 110 02/08/2022 1035   HDL 70 02/08/2022 1035   CHOLHDL 2.7 02/08/2022 1035   CHOLHDL 2.4 10/25/2017 0418   VLDL 12 10/25/2017 0418   LDLCALC 100 (H) 02/08/2022 1035      Wt Readings from Last 3 Encounters:  09/20/22 124 lb 3.2 oz (56.3 kg)  03/05/22 138 lb 12.8 oz (63 kg)  02/08/22 138 lb 9.6 oz (62.9 kg)      ASSESSMENT AND PLAN:  1  CAD  Pt with extensive coronary artery calcifications /atherosclerosis   She remains symptom free   Keep active  Manage risk factors        2   Lipids   Pt toletating nexletol   Will get lipomed today   3  HTN   BP is better at home   120s/ Continue meds     4   Diet   Whole, natural food   Limit sugars        F/U in 9 months    Current medicines are reviewed at length with the patient today.  The patient does not  have concerns regarding medicines.  Signed, Dorris Carnes, MD  09/20/2022 10:51 AM    [facility] [address], [place], [place]  [address] Phone: [phone]; Fax: [phone]

## 2022-09-20 ENCOUNTER — Ambulatory Visit: Payer: Medicare Other | Attending: Internal Medicine | Admitting: Internal Medicine

## 2022-09-20 ENCOUNTER — Encounter: Payer: Self-pay | Admitting: Internal Medicine

## 2022-09-20 VITALS — BP 140/84 | HR 80 | Ht 61.0 in | Wt 124.2 lb

## 2022-09-20 DIAGNOSIS — Z79899 Other long term (current) drug therapy: Secondary | ICD-10-CM | POA: Diagnosis not present

## 2022-09-20 DIAGNOSIS — E782 Mixed hyperlipidemia: Secondary | ICD-10-CM | POA: Insufficient documentation

## 2022-09-20 NOTE — Patient Instructions (Signed)
Medication Instructions:   *If you need a refill on your cardiac medications before your next appointment, please call your pharmacy*   Lab Work: NMR, HGBA1C, BMET If you have labs (blood work) drawn today and your tests are completely normal, you will receive your results only by: [place] (if you have MyChart) OR A paper copy in the mail If you have any lab test that is abnormal or we need to change your treatment, we will call you to review the results.   Testing/Procedures:    Follow-Up: At [facility], you and your health needs are our priority.  As part of our continuing mission to provide you with exceptional heart care, we have created designated Provider Care Teams.  These Care Teams include your primary Cardiologist (physician) and Advanced Practice Providers (APPs -  Physician Assistants and Nurse Practitioners) who all work together to provide you with the care you need, when you need it.  We recommend signing up for the patient portal called "MyChart".  Sign up information is provided on this After Visit Summary.  MyChart is used to connect with patients for Virtual Visits (Telemedicine).  Patients are able to view lab/test results, encounter notes, upcoming appointments, etc.  Non-urgent messages can be sent to your provider as well.   To learn more about what you can do with MyChart, go to [email or web address].    Your next appointment:   10 month(s) DR Dorris Carnes    Other Instructions

## 2022-09-21 ENCOUNTER — Encounter: Payer: Self-pay | Admitting: Internal Medicine

## 2022-09-21 LAB — BASIC METABOLIC PANEL
BUN/Creatinine Ratio: 21 (ref 12–28)
BUN: 22 mg/dL (ref 8–27)
CO2: 27 mmol/L (ref 20–29)
Calcium: 9.9 mg/dL (ref 8.7–10.3)
Chloride: 98 mmol/L (ref 96–106)
Creatinine, Ser: 1.06 mg/dL — ABNORMAL HIGH (ref 0.57–1.00)
Glucose: 97 mg/dL (ref 70–99)
Potassium: 3.9 mmol/L (ref 3.5–5.2)
Sodium: 140 mmol/L (ref 134–144)
eGFR: 53 mL/min/{1.73_m2} — ABNORMAL LOW (ref >59)

## 2022-09-21 LAB — NMR, LIPOPROFILE
Cholesterol, Total: 145 mg/dL (ref 100–199)
HDL Particle Number: 46.6 umol/L (ref >=30.5)
HDL-C: 73 mg/dL (ref >39)
LDL Particle Number: 923 nmol/L (ref <1000)
LDL Size: 20.1 nm — ABNORMAL LOW (ref >20.5)
LDL-C (NIH Calc): 61 mg/dL (ref 0–99)
LP-IR Score: 35 (ref <=45)
Small LDL Particle Number: 511 nmol/L (ref <=527)
Triglycerides: 47 mg/dL (ref 0–149)

## 2022-09-21 LAB — HEMOGLOBIN A1C
Est. average glucose Bld gHb Est-mCnc: 117 mg/dL
Hgb A1c MFr Bld: 5.7 % — ABNORMAL HIGH (ref 4.8–5.6)

## 2022-09-25 DIAGNOSIS — R7303 Prediabetes: Secondary | ICD-10-CM | POA: Diagnosis not present

## 2022-09-25 DIAGNOSIS — M1991 Primary osteoarthritis, unspecified site: Secondary | ICD-10-CM | POA: Diagnosis not present

## 2022-09-25 DIAGNOSIS — N1831 Chronic kidney disease, stage 3a: Secondary | ICD-10-CM | POA: Diagnosis not present

## 2022-09-25 DIAGNOSIS — I129 Hypertensive chronic kidney disease with stage 1 through stage 4 chronic kidney disease, or unspecified chronic kidney disease: Secondary | ICD-10-CM | POA: Diagnosis not present

## 2022-09-25 DIAGNOSIS — H9190 Unspecified hearing loss, unspecified ear: Secondary | ICD-10-CM | POA: Diagnosis not present

## 2022-09-25 DIAGNOSIS — I2510 Atherosclerotic heart disease of native coronary artery without angina pectoris: Secondary | ICD-10-CM | POA: Diagnosis not present

## 2022-09-25 DIAGNOSIS — I700 Atherosclerosis of aorta: Secondary | ICD-10-CM | POA: Diagnosis not present

## 2022-09-25 DIAGNOSIS — Z6822 Body mass index (BMI) 22.0-22.9, adult: Secondary | ICD-10-CM | POA: Diagnosis not present

## 2022-09-25 DIAGNOSIS — G894 Chronic pain syndrome: Secondary | ICD-10-CM | POA: Diagnosis not present

## 2022-10-28 ENCOUNTER — Telehealth: Payer: Self-pay | Admitting: Family Medicine

## 2022-10-28 NOTE — Telephone Encounter (Signed)
I called patient to schedule AWV.  Patient said she has transferred to a new PCP, Dr. Kathyrn Lass.  Patient said she's very unhappy with the office. She said the office is run very poorly and will lose a lot of patients. Please remove PCP.

## 2022-11-04 ENCOUNTER — Telehealth: Payer: Self-pay | Admitting: *Deleted

## 2022-11-04 NOTE — Patient Outreach (Signed)
  Care Coordination   Initial Visit Note   11/04/2022 Name: Jessica Beck MRN: [id number] DOB: 08-Nov-1940  Jessica Beck is a 82 y.o. year old female who sees Kathyrn Lass, MD for primary care. I spoke with  Gweneth Fritter by phone today.  What matters to the patients health and wellness today?  Pt declines the Care Coordination support team at this time. Pt reports she is covered under [employer] for [employer] and occasionally has a RX issues- advised her to reach out to PCP office for Pharmacy team assistance as well as RNCM and CSW if needed.    Goals Addressed   None     SDOH assessments and interventions completed:  No     Care Coordination Interventions:  No, not indicated   Follow up plan: No further intervention required.   Encounter Outcome:  Pt. Visit Completed

## 2022-11-15 DIAGNOSIS — H4311 Vitreous hemorrhage, right eye: Secondary | ICD-10-CM | POA: Diagnosis not present

## 2022-11-15 DIAGNOSIS — H33311 Horseshoe tear of retina without detachment, right eye: Secondary | ICD-10-CM | POA: Diagnosis not present

## 2022-11-15 DIAGNOSIS — H26491 Other secondary cataract, right eye: Secondary | ICD-10-CM | POA: Diagnosis not present

## 2022-11-15 DIAGNOSIS — H43391 Other vitreous opacities, right eye: Secondary | ICD-10-CM | POA: Diagnosis not present

## 2022-11-22 DIAGNOSIS — H33311 Horseshoe tear of retina without detachment, right eye: Secondary | ICD-10-CM | POA: Diagnosis not present

## 2022-11-22 DIAGNOSIS — H43391 Other vitreous opacities, right eye: Secondary | ICD-10-CM | POA: Diagnosis not present

## 2022-11-22 DIAGNOSIS — Z9889 Other specified postprocedural states: Secondary | ICD-10-CM | POA: Diagnosis not present

## 2022-11-22 DIAGNOSIS — H31091 Other chorioretinal scars, right eye: Secondary | ICD-10-CM | POA: Diagnosis not present

## 2022-12-25 DIAGNOSIS — I129 Hypertensive chronic kidney disease with stage 1 through stage 4 chronic kidney disease, or unspecified chronic kidney disease: Secondary | ICD-10-CM | POA: Diagnosis not present

## 2022-12-25 DIAGNOSIS — E7800 Pure hypercholesterolemia, unspecified: Secondary | ICD-10-CM | POA: Diagnosis not present

## 2022-12-25 DIAGNOSIS — M1991 Primary osteoarthritis, unspecified site: Secondary | ICD-10-CM | POA: Diagnosis not present

## 2022-12-25 DIAGNOSIS — H43391 Other vitreous opacities, right eye: Secondary | ICD-10-CM | POA: Diagnosis not present

## 2022-12-25 DIAGNOSIS — D485 Neoplasm of uncertain behavior of skin: Secondary | ICD-10-CM | POA: Diagnosis not present

## 2022-12-25 DIAGNOSIS — H31091 Other chorioretinal scars, right eye: Secondary | ICD-10-CM | POA: Diagnosis not present

## 2022-12-25 DIAGNOSIS — N1831 Chronic kidney disease, stage 3a: Secondary | ICD-10-CM | POA: Diagnosis not present

## 2022-12-25 DIAGNOSIS — M1990 Unspecified osteoarthritis, unspecified site: Secondary | ICD-10-CM | POA: Diagnosis not present

## 2022-12-25 DIAGNOSIS — G894 Chronic pain syndrome: Secondary | ICD-10-CM | POA: Diagnosis not present

## 2022-12-25 DIAGNOSIS — Z9889 Other specified postprocedural states: Secondary | ICD-10-CM | POA: Diagnosis not present

## 2022-12-25 DIAGNOSIS — H33311 Horseshoe tear of retina without detachment, right eye: Secondary | ICD-10-CM | POA: Diagnosis not present

## 2023-01-14 DIAGNOSIS — L819 Disorder of pigmentation, unspecified: Secondary | ICD-10-CM | POA: Diagnosis not present

## 2023-01-14 DIAGNOSIS — D1801 Hemangioma of skin and subcutaneous tissue: Secondary | ICD-10-CM | POA: Diagnosis not present

## 2023-01-14 DIAGNOSIS — L821 Other seborrheic keratosis: Secondary | ICD-10-CM | POA: Diagnosis not present

## 2023-01-14 DIAGNOSIS — L578 Other skin changes due to chronic exposure to nonionizing radiation: Secondary | ICD-10-CM | POA: Diagnosis not present

## 2023-01-14 DIAGNOSIS — L570 Actinic keratosis: Secondary | ICD-10-CM | POA: Diagnosis not present

## 2023-01-14 DIAGNOSIS — L814 Other melanin hyperpigmentation: Secondary | ICD-10-CM | POA: Diagnosis not present

## 2023-04-30 ENCOUNTER — Other Ambulatory Visit: Payer: Self-pay | Admitting: Internal Medicine

## 2023-04-30 MED ORDER — NEXLETOL 180 MG PO TABS: 180.0000 mg | ORAL_TABLET | Freq: Every day | ORAL | 1 refills | Status: DC

## 2023-05-05 DIAGNOSIS — M79671 Pain in right foot: Secondary | ICD-10-CM | POA: Diagnosis not present

## 2023-05-05 DIAGNOSIS — M79641 Pain in right hand: Secondary | ICD-10-CM | POA: Diagnosis not present

## 2023-05-05 DIAGNOSIS — Z6821 Body mass index (BMI) 21.0-21.9, adult: Secondary | ICD-10-CM | POA: Diagnosis not present

## 2023-05-05 DIAGNOSIS — R5383 Other fatigue: Secondary | ICD-10-CM | POA: Diagnosis not present

## 2023-05-05 DIAGNOSIS — M2560 Stiffness of unspecified joint, not elsewhere classified: Secondary | ICD-10-CM | POA: Diagnosis not present

## 2023-05-05 DIAGNOSIS — M79642 Pain in left hand: Secondary | ICD-10-CM | POA: Diagnosis not present

## 2023-05-05 DIAGNOSIS — M2540 Effusion, unspecified joint: Secondary | ICD-10-CM | POA: Diagnosis not present

## 2023-05-05 DIAGNOSIS — M79672 Pain in left foot: Secondary | ICD-10-CM | POA: Diagnosis not present

## 2023-05-09 DIAGNOSIS — Z23 Encounter for immunization: Secondary | ICD-10-CM | POA: Diagnosis not present

## 2023-05-13 DIAGNOSIS — M2560 Stiffness of unspecified joint, not elsewhere classified: Secondary | ICD-10-CM | POA: Diagnosis not present

## 2023-05-13 DIAGNOSIS — Z6821 Body mass index (BMI) 21.0-21.9, adult: Secondary | ICD-10-CM | POA: Diagnosis not present

## 2023-05-13 DIAGNOSIS — M79672 Pain in left foot: Secondary | ICD-10-CM | POA: Diagnosis not present

## 2023-05-13 DIAGNOSIS — M2540 Effusion, unspecified joint: Secondary | ICD-10-CM | POA: Diagnosis not present

## 2023-05-13 DIAGNOSIS — M79671 Pain in right foot: Secondary | ICD-10-CM | POA: Diagnosis not present

## 2023-05-13 DIAGNOSIS — M79642 Pain in left hand: Secondary | ICD-10-CM | POA: Diagnosis not present

## 2023-05-13 DIAGNOSIS — M79641 Pain in right hand: Secondary | ICD-10-CM | POA: Diagnosis not present

## 2023-05-13 DIAGNOSIS — M0609 Rheumatoid arthritis without rheumatoid factor, multiple sites: Secondary | ICD-10-CM | POA: Diagnosis not present

## 2023-07-14 DIAGNOSIS — R7303 Prediabetes: Secondary | ICD-10-CM | POA: Diagnosis not present

## 2023-07-14 DIAGNOSIS — I129 Hypertensive chronic kidney disease with stage 1 through stage 4 chronic kidney disease, or unspecified chronic kidney disease: Secondary | ICD-10-CM | POA: Diagnosis not present

## 2023-07-14 DIAGNOSIS — M1991 Primary osteoarthritis, unspecified site: Secondary | ICD-10-CM | POA: Diagnosis not present

## 2023-07-14 DIAGNOSIS — M0600 Rheumatoid arthritis without rheumatoid factor, unspecified site: Secondary | ICD-10-CM | POA: Diagnosis not present

## 2023-07-22 DIAGNOSIS — M2560 Stiffness of unspecified joint, not elsewhere classified: Secondary | ICD-10-CM | POA: Diagnosis not present

## 2023-07-22 DIAGNOSIS — M79641 Pain in right hand: Secondary | ICD-10-CM | POA: Diagnosis not present

## 2023-07-22 DIAGNOSIS — M79642 Pain in left hand: Secondary | ICD-10-CM | POA: Diagnosis not present

## 2023-07-22 DIAGNOSIS — R5383 Other fatigue: Secondary | ICD-10-CM | POA: Diagnosis not present

## 2023-07-22 DIAGNOSIS — M79671 Pain in right foot: Secondary | ICD-10-CM | POA: Diagnosis not present

## 2023-07-22 DIAGNOSIS — Z6821 Body mass index (BMI) 21.0-21.9, adult: Secondary | ICD-10-CM | POA: Diagnosis not present

## 2023-07-22 DIAGNOSIS — M79672 Pain in left foot: Secondary | ICD-10-CM | POA: Diagnosis not present

## 2023-07-22 DIAGNOSIS — M2540 Effusion, unspecified joint: Secondary | ICD-10-CM | POA: Diagnosis not present

## 2023-07-22 DIAGNOSIS — M25511 Pain in right shoulder: Secondary | ICD-10-CM | POA: Diagnosis not present

## 2023-07-22 DIAGNOSIS — M0609 Rheumatoid arthritis without rheumatoid factor, multiple sites: Secondary | ICD-10-CM | POA: Diagnosis not present

## 2023-08-01 DIAGNOSIS — M25511 Pain in right shoulder: Secondary | ICD-10-CM | POA: Diagnosis not present

## 2023-08-01 DIAGNOSIS — M79641 Pain in right hand: Secondary | ICD-10-CM | POA: Diagnosis not present

## 2023-09-22 DIAGNOSIS — Z1331 Encounter for screening for depression: Secondary | ICD-10-CM | POA: Diagnosis not present

## 2023-09-22 DIAGNOSIS — Z0000 Encounter for general adult medical examination without abnormal findings: Secondary | ICD-10-CM | POA: Diagnosis not present

## 2023-09-22 DIAGNOSIS — Z6820 Body mass index (BMI) 20.0-20.9, adult: Secondary | ICD-10-CM | POA: Diagnosis not present

## 2023-09-22 DIAGNOSIS — Z23 Encounter for immunization: Secondary | ICD-10-CM | POA: Diagnosis not present

## 2023-09-26 DIAGNOSIS — H52203 Unspecified astigmatism, bilateral: Secondary | ICD-10-CM | POA: Diagnosis not present

## 2023-09-26 DIAGNOSIS — Z961 Presence of intraocular lens: Secondary | ICD-10-CM | POA: Diagnosis not present

## 2023-10-21 DIAGNOSIS — Z6821 Body mass index (BMI) 21.0-21.9, adult: Secondary | ICD-10-CM | POA: Diagnosis not present

## 2023-10-21 DIAGNOSIS — M79671 Pain in right foot: Secondary | ICD-10-CM | POA: Diagnosis not present

## 2023-10-21 DIAGNOSIS — M0609 Rheumatoid arthritis without rheumatoid factor, multiple sites: Secondary | ICD-10-CM | POA: Diagnosis not present

## 2023-10-21 DIAGNOSIS — R5383 Other fatigue: Secondary | ICD-10-CM | POA: Diagnosis not present

## 2023-10-21 DIAGNOSIS — M79672 Pain in left foot: Secondary | ICD-10-CM | POA: Diagnosis not present

## 2023-10-21 DIAGNOSIS — M79641 Pain in right hand: Secondary | ICD-10-CM | POA: Diagnosis not present

## 2023-10-21 DIAGNOSIS — M2560 Stiffness of unspecified joint, not elsewhere classified: Secondary | ICD-10-CM | POA: Diagnosis not present

## 2023-10-21 DIAGNOSIS — M79642 Pain in left hand: Secondary | ICD-10-CM | POA: Diagnosis not present

## 2023-10-21 DIAGNOSIS — M25511 Pain in right shoulder: Secondary | ICD-10-CM | POA: Diagnosis not present

## 2023-10-21 DIAGNOSIS — M2540 Effusion, unspecified joint: Secondary | ICD-10-CM | POA: Diagnosis not present

## 2023-10-23 DIAGNOSIS — Z1231 Encounter for screening mammogram for malignant neoplasm of breast: Secondary | ICD-10-CM | POA: Diagnosis not present

## 2023-10-23 DIAGNOSIS — M8588 Other specified disorders of bone density and structure, other site: Secondary | ICD-10-CM | POA: Diagnosis not present

## 2023-10-27 ENCOUNTER — Other Ambulatory Visit: Payer: Self-pay | Admitting: Internal Medicine

## 2023-11-11 NOTE — Progress Notes (Unsigned)
 Cardiology Office Note    Patient Name: Jessica Beck Date of Encounter: 11/12/2023  Primary Care Provider:  Sigmund Hazel, MD Primary Cardiologist:  None Primary Electrophysiologist: None   Past Medical History    Past Medical History:  Diagnosis Date   Anxiety    situ   Arthritis    osteo    Asthma    mild   Complication of anesthesia    general - long time to wake up.  Takes a longer than  usual time to wake up after colonoscopy.   Gall bladder stones    present   GERD (gastroesophageal reflux disease)    Hypertension    IBS (irritable bowel syndrome)    with diarrhea   Migraine 03/07/2017   03/20/20- "mild"   Multiple thyroid nodules     History of Present Illness  Jessica Beck is a 83 y.o. female with a PMH of coronary calcifications, HTN, GERD, asthma, HLD who presents today for annual follow-up.  Jessica Beck was seen initially by Jessica Beck in 03/2022 for coronary calcification seen on CT in 2021.  During visit patient was asymptomatic and blood pressure was stable. She has a history of statins and was not in favor of injections.  She was started on Nexletol remains symptom free with medication.  She was last seen on 09/20/2022 for follow-up and reported doing well and staying active walking her corgi 1.5 miles.  She noted occasional heart racing that lasted about 1 minute and blood pressures were stable and noted to be better at home.  Jessica Beck presents today for annual follow-up.  During today's visit she reports  episodes of palpitations described as her heart 'going crazy' and 'ka-funk, ka-funk' with such intensity that her clothes visibly move. These episodes are accompanied by a rapid heart rate but no dizziness, lightheadedness, or shortness of breath. The palpitations occur randomly, last about 30 seconds, and are not linked to specific triggers like caffeine. They are infrequent, with weeks sometimes passing between episodes. Her blood pressure was stable at  that time, and she was started on Nexletol due to previous myalgias with statins. She continues to take Nexletol without issues, and her cholesterol levels were noted to be in range as of February last year. Her kidney function was stable as of December last year. Over the past two years, she has lost significant weight, dropping from over 150 pounds to between 110 and 115 pounds, through dietary changes such as reducing sugar and alcohol intake. She remains active, walking her dog daily, and reports feeling much better overall.  she was advised to stop this medication by her rheumatologist due to the heart symptoms. Since discontinuing Plaquenil, She has not noticed any significant change in the frequency or intensity of the palpitations. Her past medical history includes arthritis affecting her joints, particularly her toes and hands. No swelling in her ankles. Patient denies chest pain, palpitations, dyspnea, PND, orthopnea, nausea, vomiting, dizziness, syncope, edema, weight gain, or early satiety.  Discussed the use of AI scribe software for clinical note transcription with the patient, who gave verbal consent to proceed.  History of Present Illness   Review of Systems  Please see the history of present illness.    All other systems reviewed and are otherwise negative except as noted above.  Physical Exam    Wt Readings from Last 3 Encounters:  11/12/23 117 lb (53.1 kg)  09/20/22 124 lb 3.2 oz (56.3 kg)  03/05/22 138 lb 12.8 oz (63  kg)   VS: Vitals:   11/12/23 1422  BP: (!) 142/92  Pulse: 74  Resp: 16  SpO2: 98%  ,Body mass index is 22.11 kg/m. GEN: Well nourished, well developed in no acute distress Neck: No JVD; No carotid bruits Pulmonary: Clear to auscultation without rales, wheezing or rhonchi  Cardiovascular: Normal rate. Regular rhythm. Normal S1. Normal S2.   Murmurs: There is no murmur.  ABDOMEN: Soft, non-tender, non-distended EXTREMITIES:  No edema; No deformity    EKG/LABS/ Recent Cardiac Studies   ECG personally reviewed by me today -sinus rhythm with rate of 87 bpm and no acute changes consistent with previous EKG.  Risk Assessment/Calculations:          Lab Results  Component Value Date   WBC 5.5 03/21/2020   HGB 13.7 03/21/2020   HCT 42.9 03/21/2020   MCV 92.3 03/21/2020   PLT 273 03/21/2020   Lab Results  Component Value Date   CREATININE 1.06 (H) 09/20/2022   BUN 22 09/20/2022   NA 140 09/20/2022   K 3.9 09/20/2022   CL 98 09/20/2022   CO2 27 09/20/2022   Lab Results  Component Value Date   CHOL 189 02/08/2022   HDL 70 02/08/2022   LDLCALC 100 (H) 02/08/2022   TRIG 110 02/08/2022   CHOLHDL 2.7 02/08/2022    Lab Results  Component Value Date   HGBA1C 5.7 (H) 09/20/2022   Assessment & Plan    1.  Essential hypertension: -Patient's blood pressure was elevated at 142/92 and unfortunately was not rechecked -She notes an element of whitecoat hypertension and reported blood pressures at home in the 120s over 70s -Patient was advised to monitor blood pressures and contact office with results. -If blood pressures are elevated we will initiate possible antihypertensive therapy  2.  Hyperlipidemia: -Patient's last LDL cholesterol was 61 -Continue Nexletol 180 mg  3.  Coronary calcifications: -Asymptomatic with no new cardiac symptoms. On Nexletol to stabilize plaque and prevent progression. - Order coronary calcium score CT scan to assess calcification extent. - Continue Nexletol therapy.  4.Palpitations Intermittent palpitations with no associated symptom - Order 14-day Zio patch event monitor to assess heart rhythm and frequency. - Check magnesium levels to rule out electrolyte imbalance. - Check thyroid and kidney function.  Disposition: Follow-up with None or APP in 12 months    Signed, Jessica Beck, Jessica Rains, NP 11/12/2023, 2:34 PM [facility]

## 2023-11-12 ENCOUNTER — Encounter: Payer: Self-pay | Admitting: Nurse Practitioner

## 2023-11-12 ENCOUNTER — Ambulatory Visit (INDEPENDENT_AMBULATORY_CARE_PROVIDER_SITE_OTHER)

## 2023-11-12 ENCOUNTER — Ambulatory Visit: Attending: Nurse Practitioner | Admitting: Nurse Practitioner

## 2023-11-12 VITALS — BP 142/92 | HR 74 | Resp 16 | Ht 61.0 in | Wt 117.0 lb

## 2023-11-12 DIAGNOSIS — E782 Mixed hyperlipidemia: Secondary | ICD-10-CM

## 2023-11-12 DIAGNOSIS — I2510 Atherosclerotic heart disease of native coronary artery without angina pectoris: Secondary | ICD-10-CM

## 2023-11-12 DIAGNOSIS — R002 Palpitations: Secondary | ICD-10-CM

## 2023-11-12 DIAGNOSIS — I10 Essential (primary) hypertension: Secondary | ICD-10-CM | POA: Diagnosis not present

## 2023-11-12 NOTE — Progress Notes (Unsigned)
Applied a 14 day Zio XT monitor to patient in the office  Ross to read

## 2023-11-12 NOTE — Patient Instructions (Signed)
 Medication Instructions:  Your physician recommends that you continue on your current medications as directed. Please refer to the Current Medication list given to you today. *If you need a refill on your cardiac medications before your next appointment, please call your pharmacy*  Lab Work: TODAY-CMET, MAG, TSH If you have labs (blood work) drawn today and your tests are completely normal, you will receive your results only by: MyChart Message (if you have MyChart) OR A paper copy in the mail If you have any lab test that is abnormal or we need to change your treatment, we will call you to review the results.  Testing/Procedures: Cardiac calcium score  ZIO XT- Long Term Monitor Instructions  Your physician has requested you wear a ZIO patch monitor for 14 days.  This is a single patch monitor. [employer] supplies one patch monitor per enrollment. Additional stickers are not available. Please do not apply patch if you will be having a Nuclear Stress Test,  Echocardiogram, Cardiac CT, MRI, or Chest Xray during the period you would be wearing the  monitor. The patch cannot be worn during these tests. You cannot remove and re-apply the  ZIO XT patch monitor.  Your ZIO patch monitor will be mailed 3 day USPS to your address on file. It may take 3-5 days  to receive your monitor after you have been enrolled.  Once you have received your monitor, please review the enclosed instructions. Your monitor  has already been registered assigning a specific monitor serial # to you.  Billing and Patient Assistance Program Information  We have supplied [employer] with any of your insurance information on file for billing purposes. [employer] offers a sliding scale Patient Assistance Program for patients that do not have  insurance, or whose insurance does not completely cover the cost of the ZIO monitor.  You must apply for the Patient Assistance Program to qualify for this discounted rate.  To apply, please  call [employer] at [phone], select option 4, select option 2, ask to apply for  Patient Assistance Program. Meredeth Ide will ask your household income, and how many people  are in your household. They will quote your out-of-pocket cost based on that information.  [employer] will also be able to set up a 47-month, interest-free payment plan if needed.  Applying the monitor   Shave hair from upper left chest.  Hold abrader disc by orange tab. Rub abrader in 40 strokes over the upper left chest as  indicated in your monitor instructions.  Clean area with 4 enclosed alcohol pads. Let dry.  Apply patch as indicated in monitor instructions. Patch will be placed under collarbone on left  side of chest with arrow pointing upward.  Rub patch adhesive wings for 2 minutes. Remove white label marked "1". Remove the white  label marked "2". Rub patch adhesive wings for 2 additional minutes.  While looking in a mirror, press and release button in center of patch. A small green light will  flash 3-4 times. This will be your only indicator that the monitor has been turned on.  Do not shower for the first 24 hours. You may shower after the first 24 hours.  Press the button if you feel a symptom. You will hear a small click. Record Date, Time and  Symptom in the Patient Logbook.  When you are ready to remove the patch, follow instructions on the last 2 pages of Patient  Logbook. Stick patch monitor onto the last page of Patient Logbook.  Place  Patient Logbook in the blue and white box. Use locking tab on box and tape box closed  securely. The blue and white box has prepaid postage on it. Please place it in the mailbox as  soon as possible. Your physician should have your test results approximately 7 days after the  monitor has been mailed back to [facility].  Call [facility] Customer Care at [phone] if you have questions regarding  your ZIO XT patch monitor. Call them immediately if you see an  orange light blinking on your  monitor.  If your monitor falls off in less than 4 days, contact our Monitor department at [phone].  If your monitor becomes loose or falls off after 4 days call [employer] at [phone] for  suggestions on securing your monitor   Follow-Up: At [facility], you and your health needs are our priority.  As part of our continuing mission to provide you with exceptional heart care, our providers are all part of one team.  This team includes your primary Cardiologist (physician) and Advanced Practice Providers or APPs (Physician Assistants and Nurse Practitioners) who all work together to provide you with the care you need, when you need it.  Your next appointment:   12 month(s)  Provider:   Dietrich Pates, MD      We recommend signing up for the patient portal called "MyChart".  Sign up information is provided on this After Visit Summary.  MyChart is used to connect with patients for Virtual Visits (Telemedicine).  Patients are able to view lab/test results, encounter notes, upcoming appointments, etc.  Non-urgent messages can be sent to your provider as well.   To learn more about what you can do with MyChart, go to [email or web address].   Other Instructions       1st Floor: - Lobby - Registration  - Pharmacy  - Lab - Cafe  2nd Floor: - PV Lab - Diagnostic Testing (echo, CT, nuclear med)  3rd Floor: - Vacant  4th Floor: - TCTS (cardiothoracic surgery) - AFib Clinic - Structural Heart Clinic - Vascular Surgery  - Vascular Ultrasound  5th Floor: - HeartCare Cardiology (general and EP) - Clinical Pharmacy for coumadin, hypertension, lipid, weight-loss medications, and med management appointments    Valet parking services will be available as well.

## 2023-11-13 LAB — COMPREHENSIVE METABOLIC PANEL WITH GFR
ALT: 15 IU/L (ref 0–32)
AST: 31 IU/L (ref 0–40)
Albumin: 5.2 g/dL — ABNORMAL HIGH (ref 3.7–4.7)
Alkaline Phosphatase: 54 IU/L (ref 44–121)
BUN/Creatinine Ratio: 24 (ref 12–28)
BUN: 22 mg/dL (ref 8–27)
Bilirubin Total: 0.5 mg/dL (ref 0.0–1.2)
CO2: 25 mmol/L (ref 20–29)
Calcium: 11 mg/dL — ABNORMAL HIGH (ref 8.7–10.3)
Chloride: 97 mmol/L (ref 96–106)
Creatinine, Ser: 0.92 mg/dL (ref 0.57–1.00)
Globulin, Total: 2.4 g/dL (ref 1.5–4.5)
Glucose: 85 mg/dL (ref 70–99)
Potassium: 4.1 mmol/L (ref 3.5–5.2)
Sodium: 141 mmol/L (ref 134–144)
Total Protein: 7.6 g/dL (ref 6.0–8.5)
eGFR: 63 mL/min/{1.73_m2} (ref >59)

## 2023-11-13 LAB — MAGNESIUM: Magnesium: 2.4 mg/dL — ABNORMAL HIGH (ref 1.6–2.3)

## 2023-11-13 LAB — TSH: TSH: 0.129 u[IU]/mL — ABNORMAL LOW (ref 0.450–4.500)

## 2023-11-14 ENCOUNTER — Other Ambulatory Visit: Payer: Self-pay

## 2023-11-14 DIAGNOSIS — R7989 Other specified abnormal findings of blood chemistry: Secondary | ICD-10-CM

## 2023-12-01 ENCOUNTER — Ambulatory Visit (HOSPITAL_COMMUNITY)
Admission: RE | Admit: 2023-12-01 | Discharge: 2023-12-01 | Disposition: A | Discharge status: Home / Self Care | Payer: Self-pay | Source: Ambulatory Visit | Attending: Nurse Practitioner | Admitting: Nurse Practitioner

## 2023-12-01 DIAGNOSIS — I2510 Atherosclerotic heart disease of native coronary artery without angina pectoris: Secondary | ICD-10-CM | POA: Insufficient documentation

## 2023-12-01 DIAGNOSIS — R002 Palpitations: Secondary | ICD-10-CM | POA: Insufficient documentation

## 2023-12-01 DIAGNOSIS — E782 Mixed hyperlipidemia: Secondary | ICD-10-CM | POA: Insufficient documentation

## 2023-12-01 DIAGNOSIS — I10 Essential (primary) hypertension: Secondary | ICD-10-CM | POA: Insufficient documentation

## 2023-12-03 ENCOUNTER — Telehealth: Payer: Self-pay

## 2023-12-03 ENCOUNTER — Other Ambulatory Visit: Payer: Self-pay

## 2023-12-03 DIAGNOSIS — I2510 Atherosclerotic heart disease of native coronary artery without angina pectoris: Secondary | ICD-10-CM

## 2023-12-03 DIAGNOSIS — R002 Palpitations: Secondary | ICD-10-CM | POA: Diagnosis not present

## 2023-12-03 DIAGNOSIS — I10 Essential (primary) hypertension: Secondary | ICD-10-CM | POA: Diagnosis not present

## 2023-12-03 MED ORDER — METOPROLOL TARTRATE 50 MG PO TABS: ORAL_TABLET | ORAL | 0 refills | Status: DC

## 2023-12-03 NOTE — Telephone Encounter (Signed)
 Contacted the patient to discuss coronary cta instructions.   Copy will be sent to the patients mychart.

## 2023-12-03 NOTE — Telephone Encounter (Signed)
 Your cardiac CT will be scheduled at one of the below locations:   [facility] [address] [place], [place] [address] [phone]  OR   Jeralene Mom. [facility] [address]  [place], [place] [address] Opening December 01, 2023  If scheduled at [facility], please arrive at the [facility] (Entrance C2) of [facility] 30 minutes prior to test start time. You can use the FREE valet parking offered at entrance C (encouraged to control the heart rate for the test)  Proceed to the [facility] Radiology Department (first floor) to check-in and test prep.   All radiology patients and guests should use entrance C2 at [facility], accessed from [facility], even though the hospital's physical address listed is [address]    If scheduled at the Heart and Vascular Tower at [address], please enter the parking lot using the [address] entrance and use the FREE valet service at the patient drop-off area. Enter the buidling and check-in with registration on the main floor.  If scheduled at [facility] or [facility], please arrive 15 mins early for check-in and test prep.  There is spacious parking and easy access to the radiology department from the [facility] Heart and Vascular entrance. Please enter here and check-in with the desk attendant.   If scheduled at [facility], please arrive 30 minutes early for check-in and test prep.  Please follow these instructions carefully (unless otherwise directed):  An IV will be required for this test and Nitroglycerin will be given.  Hold all erectile dysfunction medications at least 3 days (72 hrs) prior to test. (Ie viagra, cialis, sildenafil, tadalafil, etc)   On the Night Before the Test: Be sure to Drink plenty of water. Do not consume any caffeinated/decaffeinated beverages or  chocolate 12 hours prior to your test. Do not take any antihistamines 12 hours prior to your test.   On the Day of the Test: Drink plenty of water until 1 hour prior to the test. Do not eat any food 1 hour prior to test. You may take your regular medications prior to the test.  Take metoprolol (Lopressor) 50mg  two hours prior to test. If you take Furosemide/Hydrochlorothiazide/Spironolactone/Chlorthalidone, please HOLD on the morning of the test. Patients who wear a continuous glucose monitor MUST remove the device prior to scanning. FEMALES- please wear underwire-free bra if available, avoid dresses & tight clothing   After the Test: Drink plenty of water. After receiving IV contrast, you may experience a mild flushed feeling. This is normal. On occasion, you may experience a mild rash up to 24 hours after the test. This is not dangerous. If this occurs, you can take Benadryl 25 mg, Zyrtec, Claritin, or Allegra and increase your fluid intake. (Patients taking Tikosyn should avoid Benadryl, and may take Zyrtec, Claritin, or Allegra) If you experience trouble breathing, this can be serious. If it is severe call 911 IMMEDIATELY. If it is mild, please call our office.  We will call to schedule your test 2-4 weeks out understanding that some insurance companies will need an authorization prior to the service being performed.   For more information and frequently asked questions, please visit our website : [email or web address]  For non-scheduling related questions, please contact the cardiac imaging nurse navigator should you have any questions/concerns: Cardiac Imaging Nurse Navigators Direct Office Dial: [phone]   For scheduling needs, including  cancellations and rescheduling, please call [place], [phone].

## 2023-12-08 DIAGNOSIS — R002 Palpitations: Secondary | ICD-10-CM

## 2023-12-08 DIAGNOSIS — I2510 Atherosclerotic heart disease of native coronary artery without angina pectoris: Secondary | ICD-10-CM

## 2023-12-08 DIAGNOSIS — I10 Essential (primary) hypertension: Secondary | ICD-10-CM

## 2023-12-16 ENCOUNTER — Ambulatory Visit: Payer: Self-pay

## 2023-12-17 ENCOUNTER — Encounter (HOSPITAL_COMMUNITY): Payer: Self-pay

## 2023-12-18 ENCOUNTER — Telehealth: Payer: Self-pay

## 2023-12-18 MED ORDER — METOPROLOL SUCCINATE ER 25 MG PO TB24: 25.0000 mg | ORAL_TABLET | Freq: Every day | ORAL | 3 refills | Status: AC

## 2023-12-18 NOTE — Telephone Encounter (Signed)
 I just spoke to patient     REviewed CT and monitor findings  She is not having any CP or SOB    Still having some skips  She has not started Toprol  XL yet Scheduled for CCTA tomorrow    Recomm:  I would cancel CCTA   Start toprol  XL   First 1/2   Then 1 tab Stay hydrated   Told her to message me with response in Epic

## 2023-12-18 NOTE — Telephone Encounter (Signed)
 Contacted the patient earlier today to go over monitor results. The patient asked me if she has a test coming up and I told her yes tomorrow there is a coronary cta scheduled that was recommended after the patient completed her calcium  score. Patient states she went through radiation with chemo and wants to know if its safe to do another CT. The patient did not allow me to explain any or get in a word before she got upset. Started yelling about not being able to see Dr Avanell Bob.   She expressed frustration about her health care and states she would like to know what is going on with her heart. Patient wonders if we would have known about her heart buildup if she wasn't diagnosed with thyroid  and lung cancer. Patient states she would prefer to be seen by her doctor and not a nurse. I tried to apologize to the patient for the convenience, and explain that our APPs are available when the doctors are not. But I could barely get a word in before the patient spoke over me. The patient asked how many times Dr Avanell Bob was in the office and I told her a couple times a month. Patient then said she really liked Dr Avanell Bob, but may need to find a new doctor because that is unacceptable. Patient states she needs a doctor that will have time for her and see her routinely.  Patient asked me if there was a provider here that I would recommend to a family member? Patient states if she can not find a doctor to treat her then she may have to go somewhere else because she is too old to not know what is going on with her heart.  Patient then stated that she sent a mychart message as well to get her questions answered and thought it would be easier to communicate since we got disconnected earlier. I told her I would pass the message on Renelda Carry and we would get back to her. The patient states she does not need Renelda Carry to review anything and would prefer her doctor review her message.   Patient apologized for lashing out on me before we ended  the call stating she knows that it is not my fault but she wanted to still let someone know about how she felt. Advised the patient that I would send a message over to Dr Avanell Bob and her nurse and they would be in touch with her once they returned to the office.

## 2023-12-18 NOTE — Telephone Encounter (Signed)
 Patient returned staff call regarding results.

## 2023-12-18 NOTE — Telephone Encounter (Addendum)
 CTA cancelled... I sent in the Toprol  to her Pharmacy. I called the pt to let her know and she was thankful for the call.

## 2023-12-19 ENCOUNTER — Ambulatory Visit (HOSPITAL_COMMUNITY): Admission: RE | Admit: 2023-12-19 | Source: Ambulatory Visit

## 2024-01-05 DIAGNOSIS — R946 Abnormal results of thyroid function studies: Secondary | ICD-10-CM | POA: Diagnosis not present

## 2024-01-05 DIAGNOSIS — I471 Supraventricular tachycardia, unspecified: Secondary | ICD-10-CM | POA: Diagnosis not present

## 2024-01-05 DIAGNOSIS — M0600 Rheumatoid arthritis without rheumatoid factor, unspecified site: Secondary | ICD-10-CM | POA: Diagnosis not present

## 2024-01-05 DIAGNOSIS — R7303 Prediabetes: Secondary | ICD-10-CM | POA: Diagnosis not present

## 2024-01-05 DIAGNOSIS — I129 Hypertensive chronic kidney disease with stage 1 through stage 4 chronic kidney disease, or unspecified chronic kidney disease: Secondary | ICD-10-CM | POA: Diagnosis not present

## 2024-01-05 DIAGNOSIS — M1991 Primary osteoarthritis, unspecified site: Secondary | ICD-10-CM | POA: Diagnosis not present

## 2024-01-05 DIAGNOSIS — F430 Acute stress reaction: Secondary | ICD-10-CM | POA: Diagnosis not present

## 2024-01-05 DIAGNOSIS — I2510 Atherosclerotic heart disease of native coronary artery without angina pectoris: Secondary | ICD-10-CM | POA: Diagnosis not present

## 2024-01-05 DIAGNOSIS — G894 Chronic pain syndrome: Secondary | ICD-10-CM | POA: Diagnosis not present

## 2024-01-05 DIAGNOSIS — N1831 Chronic kidney disease, stage 3a: Secondary | ICD-10-CM | POA: Diagnosis not present

## 2024-01-05 NOTE — Addendum Note (Signed)
 Addended by: Alanna Alley on: 01/05/2024 01:39 PM   Modules accepted: Orders

## 2024-01-14 DIAGNOSIS — R202 Paresthesia of skin: Secondary | ICD-10-CM | POA: Diagnosis not present

## 2024-01-14 DIAGNOSIS — L578 Other skin changes due to chronic exposure to nonionizing radiation: Secondary | ICD-10-CM | POA: Diagnosis not present

## 2024-01-14 DIAGNOSIS — L814 Other melanin hyperpigmentation: Secondary | ICD-10-CM | POA: Diagnosis not present

## 2024-01-14 DIAGNOSIS — L659 Nonscarring hair loss, unspecified: Secondary | ICD-10-CM | POA: Diagnosis not present

## 2024-01-14 DIAGNOSIS — L819 Disorder of pigmentation, unspecified: Secondary | ICD-10-CM | POA: Diagnosis not present

## 2024-01-14 DIAGNOSIS — L821 Other seborrheic keratosis: Secondary | ICD-10-CM | POA: Diagnosis not present

## 2024-01-14 DIAGNOSIS — D1801 Hemangioma of skin and subcutaneous tissue: Secondary | ICD-10-CM | POA: Diagnosis not present

## 2024-01-20 DIAGNOSIS — E0590 Thyrotoxicosis, unspecified without thyrotoxic crisis or storm: Secondary | ICD-10-CM | POA: Diagnosis not present

## 2024-01-21 DIAGNOSIS — M79641 Pain in right hand: Secondary | ICD-10-CM | POA: Diagnosis not present

## 2024-01-21 DIAGNOSIS — Z6823 Body mass index (BMI) 23.0-23.9, adult: Secondary | ICD-10-CM | POA: Diagnosis not present

## 2024-01-21 DIAGNOSIS — M2560 Stiffness of unspecified joint, not elsewhere classified: Secondary | ICD-10-CM | POA: Diagnosis not present

## 2024-01-21 DIAGNOSIS — M2540 Effusion, unspecified joint: Secondary | ICD-10-CM | POA: Diagnosis not present

## 2024-01-21 DIAGNOSIS — M0609 Rheumatoid arthritis without rheumatoid factor, multiple sites: Secondary | ICD-10-CM | POA: Diagnosis not present

## 2024-01-21 DIAGNOSIS — M79642 Pain in left hand: Secondary | ICD-10-CM | POA: Diagnosis not present

## 2024-01-21 DIAGNOSIS — M154 Erosive (osteo)arthritis: Secondary | ICD-10-CM | POA: Diagnosis not present

## 2024-01-26 ENCOUNTER — Other Ambulatory Visit: Payer: Self-pay | Admitting: Internal Medicine

## 2024-01-27 DIAGNOSIS — G894 Chronic pain syndrome: Secondary | ICD-10-CM | POA: Diagnosis not present

## 2024-01-27 DIAGNOSIS — I471 Supraventricular tachycardia, unspecified: Secondary | ICD-10-CM | POA: Diagnosis not present

## 2024-01-27 DIAGNOSIS — M1991 Primary osteoarthritis, unspecified site: Secondary | ICD-10-CM | POA: Diagnosis not present

## 2024-01-27 DIAGNOSIS — E0590 Thyrotoxicosis, unspecified without thyrotoxic crisis or storm: Secondary | ICD-10-CM | POA: Diagnosis not present

## 2024-01-27 DIAGNOSIS — M8580 Other specified disorders of bone density and structure, unspecified site: Secondary | ICD-10-CM | POA: Diagnosis not present

## 2024-02-03 DIAGNOSIS — M65341 Trigger finger, right ring finger: Secondary | ICD-10-CM | POA: Diagnosis not present

## 2024-02-03 DIAGNOSIS — M1990 Unspecified osteoarthritis, unspecified site: Secondary | ICD-10-CM | POA: Diagnosis not present

## 2024-02-03 DIAGNOSIS — M6281 Muscle weakness (generalized): Secondary | ICD-10-CM | POA: Diagnosis not present

## 2024-02-03 DIAGNOSIS — M25642 Stiffness of left hand, not elsewhere classified: Secondary | ICD-10-CM | POA: Diagnosis not present

## 2024-02-03 DIAGNOSIS — M25641 Stiffness of right hand, not elsewhere classified: Secondary | ICD-10-CM | POA: Diagnosis not present

## 2024-02-03 DIAGNOSIS — M25542 Pain in joints of left hand: Secondary | ICD-10-CM | POA: Diagnosis not present

## 2024-02-03 DIAGNOSIS — M25541 Pain in joints of right hand: Secondary | ICD-10-CM | POA: Diagnosis not present

## 2024-02-04 DIAGNOSIS — M25542 Pain in joints of left hand: Secondary | ICD-10-CM | POA: Diagnosis not present

## 2024-02-04 DIAGNOSIS — M25541 Pain in joints of right hand: Secondary | ICD-10-CM | POA: Diagnosis not present

## 2024-02-04 DIAGNOSIS — M1990 Unspecified osteoarthritis, unspecified site: Secondary | ICD-10-CM | POA: Diagnosis not present

## 2024-02-04 DIAGNOSIS — M65341 Trigger finger, right ring finger: Secondary | ICD-10-CM | POA: Diagnosis not present

## 2024-02-04 DIAGNOSIS — M25641 Stiffness of right hand, not elsewhere classified: Secondary | ICD-10-CM | POA: Diagnosis not present

## 2024-02-04 DIAGNOSIS — M6281 Muscle weakness (generalized): Secondary | ICD-10-CM | POA: Diagnosis not present

## 2024-02-04 DIAGNOSIS — M25642 Stiffness of left hand, not elsewhere classified: Secondary | ICD-10-CM | POA: Diagnosis not present

## 2024-02-11 DIAGNOSIS — M25641 Stiffness of right hand, not elsewhere classified: Secondary | ICD-10-CM | POA: Diagnosis not present

## 2024-02-11 DIAGNOSIS — M6281 Muscle weakness (generalized): Secondary | ICD-10-CM | POA: Diagnosis not present

## 2024-02-11 DIAGNOSIS — M25541 Pain in joints of right hand: Secondary | ICD-10-CM | POA: Diagnosis not present

## 2024-02-11 DIAGNOSIS — M25542 Pain in joints of left hand: Secondary | ICD-10-CM | POA: Diagnosis not present

## 2024-02-11 DIAGNOSIS — M25642 Stiffness of left hand, not elsewhere classified: Secondary | ICD-10-CM | POA: Diagnosis not present

## 2024-02-11 DIAGNOSIS — M65341 Trigger finger, right ring finger: Secondary | ICD-10-CM | POA: Diagnosis not present

## 2024-02-11 DIAGNOSIS — M1990 Unspecified osteoarthritis, unspecified site: Secondary | ICD-10-CM | POA: Diagnosis not present

## 2024-02-16 DIAGNOSIS — M25541 Pain in joints of right hand: Secondary | ICD-10-CM | POA: Diagnosis not present

## 2024-02-16 DIAGNOSIS — M6281 Muscle weakness (generalized): Secondary | ICD-10-CM | POA: Diagnosis not present

## 2024-02-16 DIAGNOSIS — M25542 Pain in joints of left hand: Secondary | ICD-10-CM | POA: Diagnosis not present

## 2024-02-16 DIAGNOSIS — M1990 Unspecified osteoarthritis, unspecified site: Secondary | ICD-10-CM | POA: Diagnosis not present

## 2024-02-16 DIAGNOSIS — M25641 Stiffness of right hand, not elsewhere classified: Secondary | ICD-10-CM | POA: Diagnosis not present

## 2024-02-16 DIAGNOSIS — M25642 Stiffness of left hand, not elsewhere classified: Secondary | ICD-10-CM | POA: Diagnosis not present

## 2024-02-16 DIAGNOSIS — M65341 Trigger finger, right ring finger: Secondary | ICD-10-CM | POA: Diagnosis not present

## 2024-02-26 ENCOUNTER — Other Ambulatory Visit (HOSPITAL_COMMUNITY): Payer: Self-pay | Admitting: Family Medicine

## 2024-02-26 DIAGNOSIS — E0590 Thyrotoxicosis, unspecified without thyrotoxic crisis or storm: Secondary | ICD-10-CM

## 2024-03-11 ENCOUNTER — Encounter (HOSPITAL_COMMUNITY): Payer: Self-pay

## 2024-03-11 ENCOUNTER — Encounter (HOSPITAL_COMMUNITY)

## 2024-03-12 ENCOUNTER — Encounter (HOSPITAL_COMMUNITY)

## 2024-03-25 ENCOUNTER — Ambulatory Visit (HOSPITAL_COMMUNITY)

## 2024-03-26 ENCOUNTER — Ambulatory Visit (HOSPITAL_COMMUNITY)

## 2024-04-07 DIAGNOSIS — Z6822 Body mass index (BMI) 22.0-22.9, adult: Secondary | ICD-10-CM | POA: Diagnosis not present

## 2024-04-07 DIAGNOSIS — G894 Chronic pain syndrome: Secondary | ICD-10-CM | POA: Diagnosis not present

## 2024-04-07 DIAGNOSIS — N1831 Chronic kidney disease, stage 3a: Secondary | ICD-10-CM | POA: Diagnosis not present

## 2024-04-07 DIAGNOSIS — I129 Hypertensive chronic kidney disease with stage 1 through stage 4 chronic kidney disease, or unspecified chronic kidney disease: Secondary | ICD-10-CM | POA: Diagnosis not present

## 2024-04-07 DIAGNOSIS — E0590 Thyrotoxicosis, unspecified without thyrotoxic crisis or storm: Secondary | ICD-10-CM | POA: Diagnosis not present

## 2024-04-07 DIAGNOSIS — R7303 Prediabetes: Secondary | ICD-10-CM | POA: Diagnosis not present

## 2024-04-07 DIAGNOSIS — Z23 Encounter for immunization: Secondary | ICD-10-CM | POA: Diagnosis not present

## 2024-04-07 DIAGNOSIS — M0600 Rheumatoid arthritis without rheumatoid factor, unspecified site: Secondary | ICD-10-CM | POA: Diagnosis not present

## 2024-04-07 DIAGNOSIS — Z789 Other specified health status: Secondary | ICD-10-CM | POA: Diagnosis not present

## 2024-04-07 DIAGNOSIS — I471 Supraventricular tachycardia, unspecified: Secondary | ICD-10-CM | POA: Diagnosis not present

## 2024-04-19 ENCOUNTER — Encounter (HOSPITAL_COMMUNITY): Payer: Self-pay

## 2024-04-19 ENCOUNTER — Encounter (HOSPITAL_COMMUNITY)
Admission: RE | Admit: 2024-04-19 | Discharge: 2024-04-19 | Disposition: A | Discharge status: Home / Self Care | Source: Ambulatory Visit | Attending: Family Medicine | Admitting: Family Medicine

## 2024-04-19 DIAGNOSIS — E0590 Thyrotoxicosis, unspecified without thyrotoxic crisis or storm: Secondary | ICD-10-CM | POA: Insufficient documentation

## 2024-04-19 MED ORDER — SODIUM IODIDE I-123 7.4 MBQ CAPS
430.0000 | ORAL_CAPSULE | Freq: Once | ORAL | Status: AC
Start: 2024-04-19 — End: 2024-04-19
  Administered 2024-04-19: 430 via ORAL

## 2024-04-20 ENCOUNTER — Encounter (HOSPITAL_COMMUNITY)
Admission: RE | Admit: 2024-04-20 | Discharge: 2024-04-20 | Disposition: A | Discharge status: Home / Self Care | Source: Ambulatory Visit | Attending: Family Medicine | Admitting: Family Medicine

## 2024-04-20 DIAGNOSIS — E0500 Thyrotoxicosis with diffuse goiter without thyrotoxic crisis or storm: Secondary | ICD-10-CM | POA: Diagnosis not present

## 2024-04-20 DIAGNOSIS — E0510 Thyrotoxicosis with toxic single thyroid nodule without thyrotoxic crisis or storm: Secondary | ICD-10-CM | POA: Diagnosis not present

## 2024-04-22 DIAGNOSIS — M2540 Effusion, unspecified joint: Secondary | ICD-10-CM | POA: Diagnosis not present

## 2024-04-22 DIAGNOSIS — M0609 Rheumatoid arthritis without rheumatoid factor, multiple sites: Secondary | ICD-10-CM | POA: Diagnosis not present

## 2024-04-22 DIAGNOSIS — M2560 Stiffness of unspecified joint, not elsewhere classified: Secondary | ICD-10-CM | POA: Diagnosis not present

## 2024-04-22 DIAGNOSIS — M154 Erosive (osteo)arthritis: Secondary | ICD-10-CM | POA: Diagnosis not present

## 2024-04-22 DIAGNOSIS — M79641 Pain in right hand: Secondary | ICD-10-CM | POA: Diagnosis not present

## 2024-04-22 DIAGNOSIS — Z6822 Body mass index (BMI) 22.0-22.9, adult: Secondary | ICD-10-CM | POA: Diagnosis not present

## 2024-04-22 DIAGNOSIS — M79642 Pain in left hand: Secondary | ICD-10-CM | POA: Diagnosis not present

## 2024-05-21 DIAGNOSIS — Z23 Encounter for immunization: Secondary | ICD-10-CM | POA: Diagnosis not present

## 2024-09-13 ENCOUNTER — Ambulatory Visit: Admitting: Orthopedic Surgery
